# Patient Record
Sex: Male | Born: 1965 | Race: Black or African American | Hispanic: No | Marital: Single | Smoking: Current every day smoker
Health system: Southern US, Community
[De-identification: ages and names within clinical notes are randomized; demographics above are authoritative.]

## PROBLEM LIST (undated history)

## (undated) ENCOUNTER — Emergency Department (HOSPITAL_COMMUNITY): Payer: Self-pay | Source: Home / Self Care

## (undated) DIAGNOSIS — G5602 Carpal tunnel syndrome, left upper limb: Secondary | ICD-10-CM

## (undated) DIAGNOSIS — K219 Gastro-esophageal reflux disease without esophagitis: Secondary | ICD-10-CM

## (undated) DIAGNOSIS — F32A Depression, unspecified: Secondary | ICD-10-CM

## (undated) DIAGNOSIS — G4730 Sleep apnea, unspecified: Secondary | ICD-10-CM

## (undated) DIAGNOSIS — M1990 Unspecified osteoarthritis, unspecified site: Secondary | ICD-10-CM

## (undated) DIAGNOSIS — G5600 Carpal tunnel syndrome, unspecified upper limb: Secondary | ICD-10-CM

## (undated) DIAGNOSIS — F329 Major depressive disorder, single episode, unspecified: Secondary | ICD-10-CM

## (undated) DIAGNOSIS — F419 Anxiety disorder, unspecified: Secondary | ICD-10-CM

## (undated) DIAGNOSIS — I10 Essential (primary) hypertension: Secondary | ICD-10-CM

## (undated) HISTORY — PX: WISDOM TOOTH EXTRACTION: SHX21

## (undated) HISTORY — PX: KNEE SURGERY: SHX244

---

## 1999-06-02 ENCOUNTER — Emergency Department (HOSPITAL_COMMUNITY): Admission: EM | Admit: 1999-06-02 | Discharge: 1999-06-03 | Payer: Self-pay | Admitting: *Deleted

## 1999-10-02 ENCOUNTER — Emergency Department (HOSPITAL_COMMUNITY): Admission: EM | Admit: 1999-10-02 | Discharge: 1999-10-02 | Payer: Self-pay

## 1999-10-04 ENCOUNTER — Observation Stay (HOSPITAL_COMMUNITY): Admission: RE | Admit: 1999-10-04 | Discharge: 1999-10-05 | Payer: Self-pay | Admitting: Orthopedic Surgery

## 2000-05-11 ENCOUNTER — Emergency Department (HOSPITAL_COMMUNITY): Admission: EM | Admit: 2000-05-11 | Discharge: 2000-05-11 | Payer: Self-pay | Admitting: Emergency Medicine

## 2001-06-28 ENCOUNTER — Emergency Department (HOSPITAL_COMMUNITY): Admission: EM | Admit: 2001-06-28 | Discharge: 2001-06-28 | Payer: Self-pay

## 2001-07-09 ENCOUNTER — Emergency Department (HOSPITAL_COMMUNITY): Admission: EM | Admit: 2001-07-09 | Discharge: 2001-07-09 | Payer: Self-pay | Admitting: Emergency Medicine

## 2005-05-14 ENCOUNTER — Emergency Department (HOSPITAL_COMMUNITY): Admission: EM | Admit: 2005-05-14 | Discharge: 2005-05-14 | Payer: Self-pay | Admitting: Emergency Medicine

## 2005-11-27 ENCOUNTER — Emergency Department (HOSPITAL_COMMUNITY): Admission: EM | Admit: 2005-11-27 | Discharge: 2005-11-27 | Payer: Self-pay | Admitting: Emergency Medicine

## 2006-09-18 ENCOUNTER — Emergency Department (HOSPITAL_COMMUNITY): Admission: EM | Admit: 2006-09-18 | Discharge: 2006-09-18 | Payer: Self-pay | Admitting: Emergency Medicine

## 2006-10-06 ENCOUNTER — Ambulatory Visit: Payer: Self-pay | Admitting: Internal Medicine

## 2006-10-06 ENCOUNTER — Inpatient Hospital Stay (HOSPITAL_COMMUNITY): Admission: EM | Admit: 2006-10-06 | Discharge: 2006-10-07 | Payer: Self-pay | Admitting: Emergency Medicine

## 2007-06-01 ENCOUNTER — Emergency Department (HOSPITAL_COMMUNITY): Admission: EM | Admit: 2007-06-01 | Discharge: 2007-06-01 | Payer: Self-pay | Admitting: Emergency Medicine

## 2007-10-29 ENCOUNTER — Emergency Department (HOSPITAL_COMMUNITY): Admission: EM | Admit: 2007-10-29 | Discharge: 2007-10-29 | Payer: Self-pay | Admitting: Emergency Medicine

## 2008-11-04 ENCOUNTER — Encounter: Admission: RE | Admit: 2008-11-04 | Discharge: 2008-11-04 | Payer: Self-pay | Admitting: Internal Medicine

## 2010-10-28 NOTE — Discharge Summary (Signed)
NAMEBROWN, DUNLAP                  ACCOUNT NO.:  [id number]   MEDICAL RECORD NO.:  [id number]          PATIENT TYPE:  INP   LOCATION:  3738                         FACILITY:  [facility]   PHYSICIAN:  Dellia Beckwith, M.D. DATE OF BIRTH:  1965-11-20   DATE OF ADMISSION:  10/06/2006  DATE OF DISCHARGE:  10/07/2006                               DISCHARGE SUMMARY   DISCHARGE DIAGNOSES:  1. Chest pain atypical, of undetermined origin.  2. Polysubstance abuse including tobacco, cocaine, marijuana, and      alcohol  3. History of panic attacks.  4. Right breast abscess status post I&D   DISCHARGE MEDICATIONS:  1. Doxycycline 100 mg p.o. b.i.d.  2. Vicodin 5/500  1 p.o. q.6 h p.r.n., #50 given, without refills.  3. Hot water compresses to right breast 4 times a day.   DISCHARGE DISPOSITION:  The patient was discharged to home in stable  state.  He will be followed in the outpatient clinic by Dr. Dellia Beckwith within 1-2 months.  The clinic was notified and the patient will  be called with his appointment shortly.  He was also notified to come  earlier to an appointment if he notices increased pain, redness or  swelling of his right breast or if he develops any fever.  At the time  of this appointment, he will need to be set up for an outpatient cardiac  stress test most likely a Cardiolite if he has gotten his health  insurance through his work then.   PROCEDURES:  October 07, 2006, bedside incision and drainage of right  breast abscess by Dr. Dellia Beckwith.   ADMITTING HISTORY AND PHYSICAL:  [name] is a 45 year old African  American man with a past medical history as listed above who came in  with multiple complaints on April 27.  1. First complaint was of chest pain that has been intermittent for      the past 2-3 months coming approximately  twice a week, located in      the anterior left chest, described as either sharp, tight, or      burning in quality, usually lasting  approximately 1 minute,      associated with being angry or upset or with hard work but also      sometimes  occurring at rest or during sleep and has occasionally      been associated with shortness of breath, diaphoresis and nausea      and sometimes is relieved by rest.  As for his other cardiac risk      factors he is a smoker of about half pack a day for 20 years.  He      uses cocaine, has never been checked for hypertension and      hyperlipidemia is not diabetic and has no family history of      atherosclerotic disease.  2. His second complaint was that of numbness in both of his arms,      which on further questioning seemed to be more localized on the tip  of his first three fingers on both hands, mostly present at night      time but also randomly during the day.  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  3. For almost a week now he has had significant pain and swelling      around his right nipple area associated with some erythema and also      one small pustule on his right lower abdominal surface.  He denies      any fevers, chills and has had a prior episode of this on his right      axillary area.   REVIEW OF SYSTEMS:  Otherwise negative.   PHYSICAL EXAM:  He had temperature of 98.1, heart rate 83, respiratory  rate 20, blood pressure 145/81, saturation of oxygen 98% on room air.  Weight 85.4 kg.  He was in no acute distress but seemed a little anxious  or nervous,  had no jugular vein distension.  HEENT:  Pupils equal, round, reactive to light and accommodation.  HEART:  Regular rate and rhythm no murmurs, rubs or gallops.  LUNGS:  Clear to auscultation bilaterally.  ABDOMEN:  Completely normal.  EXTREMITIES:  He had no lower extremity  edema and no pain or asymmetry.  He had good distal pulses.  SKIN EXAM:  He had approximately 4-5 cm x 3-4 cm indurated abscess on  his right breast going under his nipple with a small fluctuant area  centrally.  He also had an approximately 0.5 x 0.5 cm pustule or boil on  his right lower abdomen with no signs of acute cellulitis.   LABS:  WBC 9.0 with an absolute neutrophil count of 4.6, hemoglobin  15.1, hematocrit 45.0, platelets 241.  Sodium 133, potassium 4.2,  chloride 108, CO2 24, glucose 83, BUN 13, creatinine 1.  Liver profile  was completely normal and alcohol level was less than five.  Cardiac  markers were negative in the emergency room.  Urinalysis was completely  negative.  Urine drug screen positive for cocaine.  Chest x-ray showed no acute disease.   HOSPITAL COURSE:  The patient was admitted to telemetry to rule out  acute coronary syndrome, given his chest pain and use of cocaine.  His  cardiac enzymes stayed negative throughout hospitalization he had no  recurrence of his pain.  He had no abnormalities on telemetry and was  felt safe for discharge without further treatment.  Given his prolonged  use of cocaine he is at risk for premature coronary artery disease, so  at follow up in the outpatient clinic if he has gotten his health  insurance through his work he should be set up with cardiology for an  outpatient stress test possibly a Myoview.  A fasting lipid profile was  also done while in the hospital which showed a cholesterol of 176,  triglycerides 85, HDL 76 and LDL 83 so he did not need treatment for  hyperlipidemia.  1. Right breast abscess:  Incision and drainage was performed in the      hospital and cultures are pending.  He will be discharged with  doxycycline 100 mg p.o. b.i.d. for 10 days and instructed to use      hot water compresses to his left breast at least four times the day     and to follow up in the clinic if he has any fever, any  worsening      of his symptoms.  We will follow up in the cultures and culture and      him if doxycycline as inappropriate.  2. Polysubstance abuse.  The patient was counseled briefly and will      need more counseling as an outpatient for this problem.   DISCHARGE LABS AND VITALS:  Sodium 137, potassium 4.4, chloride 107, CO2  23, glucose 85, BUN 8, creatinine 0.9, calcium 9.3.      Dellia Beckwith, M.D.  Electronically Signed     VD/MEDQ  D:  10/07/2006  T:  10/07/2006  Job:  [id number]

## 2011-04-01 ENCOUNTER — Emergency Department (HOSPITAL_COMMUNITY): Payer: Self-pay

## 2011-04-01 ENCOUNTER — Emergency Department (HOSPITAL_COMMUNITY)
Admission: EM | Admit: 2011-04-01 | Discharge: 2011-04-01 | Disposition: A | Discharge status: Home / Self Care | Payer: Self-pay | Attending: Emergency Medicine | Admitting: Emergency Medicine

## 2011-04-01 DIAGNOSIS — R209 Unspecified disturbances of skin sensation: Secondary | ICD-10-CM | POA: Insufficient documentation

## 2011-04-01 DIAGNOSIS — Z7982 Long term (current) use of aspirin: Secondary | ICD-10-CM | POA: Insufficient documentation

## 2011-04-01 DIAGNOSIS — M5412 Radiculopathy, cervical region: Secondary | ICD-10-CM | POA: Insufficient documentation

## 2011-04-25 ENCOUNTER — Emergency Department (HOSPITAL_COMMUNITY)
Admission: EM | Admit: 2011-04-25 | Discharge: 2011-04-26 | Disposition: A | Discharge status: Home / Self Care | Payer: Self-pay | Attending: Emergency Medicine | Admitting: Emergency Medicine

## 2011-04-25 ENCOUNTER — Encounter: Payer: Self-pay | Admitting: Emergency Medicine

## 2011-04-25 DIAGNOSIS — IMO0002 Reserved for concepts with insufficient information to code with codable children: Secondary | ICD-10-CM | POA: Insufficient documentation

## 2011-04-25 DIAGNOSIS — M5410 Radiculopathy, site unspecified: Secondary | ICD-10-CM

## 2011-04-25 DIAGNOSIS — R209 Unspecified disturbances of skin sensation: Secondary | ICD-10-CM | POA: Insufficient documentation

## 2011-04-25 MED ORDER — DIAZEPAM 5 MG PO TABS
5.0000 mg | ORAL_TABLET | Freq: Once | ORAL | Status: AC
  Administered 2011-04-25: 5 mg via ORAL
  Filled 2011-04-25: qty 1

## 2011-04-25 MED ORDER — NAPROXEN 500 MG PO TABS: 500.0000 mg | ORAL_TABLET | Freq: Two times a day (BID) | ORAL | Status: DC

## 2011-04-25 MED ORDER — DIAZEPAM 5 MG PO TABS: 5.0000 mg | ORAL_TABLET | Freq: Three times a day (TID) | ORAL | Status: AC | PRN

## 2011-04-25 MED ORDER — NAPROXEN 500 MG PO TABS
500.0000 mg | ORAL_TABLET | Freq: Once | ORAL | Status: AC
  Administered 2011-04-26: 500 mg via ORAL
  Filled 2011-04-25: qty 1

## 2011-04-25 NOTE — ED Notes (Signed)
Pt  St's he is still having pain in his neck with tingling in bil shoulders, arms and fingers.  Pt was here for same on 10/20  Was given Rx's  Pt st's pain returned when pain meds ran out.  Pt has his discharge instructions which tell him to follow up with ortho.  Pt st's he did not know he was suppose to do that

## 2011-04-25 NOTE — ED Notes (Signed)
Happy meal given to patient

## 2011-04-26 NOTE — ED Provider Notes (Signed)
History     CSN: [id number] Arrival date & time: 04/25/2011  7:35 PM   First MD Initiated Contact with Patient 04/25/11 2307      No chief complaint on file.   (Consider location/radiation/quality/duration/timing/severity/associated sxs/prior treatment) HPI 45 year old son a presents to emergency department with complaint of tingling in both hands ongoing for about 2-3 months. Patient reports initially tingling was just to the left hand, but has been worsening over the last several weeks and is now in both hands. He denies any trauma to his head neck. Patient works with his arms over his head most of the time. Patient was seen in the emergency department and placed on a prednisone Dosepak along with Vicodin and told to followup with orthopedics. Patient has not followed up. He reports no improvement with prednisone or the Vicodin. Tingling in his hands is worse at night and wakes her from sleep at times. He denies any weakness. No prior workup for disc disease or radiculopathy History reviewed. No pertinent past medical history.  History reviewed. No pertinent past surgical history.  History reviewed. No pertinent family history.  History  Substance Use Topics  . Smoking status: Not on file  . Smokeless tobacco: Not on file  . Alcohol Use: Yes      Review of Systems  Constitutional: Negative.   HENT: Negative.   Eyes: Negative.   Respiratory: Negative.   Cardiovascular: Negative.   Gastrointestinal: Negative.   Genitourinary: Negative.   Musculoskeletal: Negative.   Skin: Negative.   Neurological: Positive for numbness.  Hematological: Negative.   Psychiatric/Behavioral: Negative.   All other systems reviewed and are negative.    Allergies  Review of patient's allergies indicates no known allergies.  Home Medications   Current Outpatient Rx  Name Route Sig Dispense Refill  . DIAZEPAM 5 MG PO TABS Oral Take 1 tablet (5 mg total) by mouth every 8 (eight) hours as  needed (muscle spasm). 15 tablet 0  . NAPROXEN 500 MG PO TABS Oral Take 1 tablet (500 mg total) by mouth 2 (two) times daily. 30 tablet 0    BP 145/88  Pulse 85  Temp(Src) 97 F (36.1 C) (Oral)  Resp 22  Ht 5\' 11"  (1.803 m)  Wt 220 lb (99.791 kg)  BMI 30.68 kg/m2  SpO2 98%  Physical Exam  Nursing note and vitals reviewed. Constitutional: He is oriented to person, place, and time. He appears well-developed and well-nourished.  HENT:  Head: Normocephalic and atraumatic.  Right Ear: External ear normal.  Left Ear: External ear normal.  Nose: Nose normal.  Mouth/Throat: Oropharynx is clear and moist.  Eyes: Conjunctivae and EOM are normal. Pupils are equal, round, and reactive to light.  Neck: Normal range of motion. Neck supple. No JVD present. No tracheal deviation present. No thyromegaly present.  Cardiovascular: Normal rate, regular rhythm, normal heart sounds and intact distal pulses.  Exam reveals no gallop and no friction rub.   No murmur heard. Pulmonary/Chest: Effort normal and breath sounds normal. No stridor. No respiratory distress. He has no wheezes. He has no rales. He exhibits no tenderness.  Abdominal: Soft. Bowel sounds are normal. He exhibits no distension and no mass. There is no tenderness. There is no rebound and no guarding.  Musculoskeletal: Normal range of motion. He exhibits no edema and no tenderness.  Lymphadenopathy:    He has no cervical adenopathy.  Neurological: He is oriented to person, place, and time. He has normal reflexes. No cranial nerve deficit. He  exhibits normal muscle tone. Coordination normal.       Patient with subjective tingling and decreased sensation in hands. Normal neuro exam.  Skin: Skin is dry. No rash noted. No erythema. No pallor.  Psychiatric: He has a normal mood and affect. His behavior is normal. Judgment and thought content normal.    ED Course  Procedures (including critical care time)  Labs Reviewed - No data to  display No results found.   1. Radiculopathy       MDM  45 year old gentleman with symptoms of radiculopathy in upper tremor days. Symptoms may be due to rotator cuff versus nerve impingement from disc disease. Patient strongly encouraged to follow up with orthopedics. Will place on NSAIDs and muscle relaxants        Olivia Mackie, MD 04/26/11 [phone]

## 2011-11-10 ENCOUNTER — Emergency Department (HOSPITAL_COMMUNITY)
Admission: EM | Admit: 2011-11-10 | Discharge: 2011-11-10 | Disposition: A | Discharge status: Home / Self Care | Payer: Self-pay | Attending: Emergency Medicine | Admitting: Emergency Medicine

## 2011-11-10 ENCOUNTER — Encounter (HOSPITAL_COMMUNITY): Payer: Self-pay | Admitting: Emergency Medicine

## 2011-11-10 DIAGNOSIS — F17200 Nicotine dependence, unspecified, uncomplicated: Secondary | ICD-10-CM | POA: Insufficient documentation

## 2011-11-10 DIAGNOSIS — M25551 Pain in right hip: Secondary | ICD-10-CM

## 2011-11-10 DIAGNOSIS — M25569 Pain in unspecified knee: Secondary | ICD-10-CM | POA: Insufficient documentation

## 2011-11-10 DIAGNOSIS — M792 Neuralgia and neuritis, unspecified: Secondary | ICD-10-CM

## 2011-11-10 DIAGNOSIS — M436 Torticollis: Secondary | ICD-10-CM | POA: Insufficient documentation

## 2011-11-10 MED ORDER — NAPROXEN 500 MG PO TABS: 500.0000 mg | ORAL_TABLET | Freq: Two times a day (BID) | ORAL | Status: DC

## 2011-11-10 MED ORDER — IBUPROFEN 800 MG PO TABS
800.0000 mg | ORAL_TABLET | Freq: Once | ORAL | Status: AC
  Administered 2011-11-10: 800 mg via ORAL
  Filled 2011-11-10: qty 1

## 2011-11-10 MED ORDER — CYCLOBENZAPRINE HCL 10 MG PO TABS: 10.0000 mg | ORAL_TABLET | Freq: Two times a day (BID) | ORAL | Status: AC | PRN

## 2011-11-10 NOTE — ED Provider Notes (Signed)
Medical screening examination/treatment/procedure(s) were performed by non-physician practitioner and as supervising physician I was immediately available for consultation/collaboration.    Nelia Shi, MD 11/10/11 [phone]

## 2011-11-10 NOTE — ED Notes (Signed)
Pt reports pinch nerve and muscular tightness along the left side of his neck. States his symptoms have worsened over the last two weeks and c/o numbness and tingling down left arm. Pt also reports right hip pain for the last 2-3 months.

## 2011-11-10 NOTE — ED Provider Notes (Signed)
History     CSN: [id number]  Arrival date & time 11/10/11  1016   First MD Initiated Contact with Patient 11/10/11 1032      Chief Complaint  Patient presents with  . Torticollis  . Hip Pain    (Consider location/radiation/quality/duration/timing/severity/associated sxs/prior treatment) HPI  And 46 year old male presents complaining of neck pain and hip pain. Patient states for the past 2 weeks he has had a gradual onset of pain to the left side of his neck. Describe pain is a sharp and throbbing sensation worsening with movement. Pain especially noticeable when the turns his head,or when he lift his arms.  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 He has also notice tingling sensation radiates down to his left arm and right arm (L>R).  Admits to occasionally drop objects due to arm weakness.  Denies fever, rash, recent trauma.  Has had similar sxs several months ago and was seen in ER for same.  Sts he was prescribed some medications which temporarily helps.  Has not f/u with ortho due to work schedule.  Pt also complain of R hip pain.  Sts onset is acute, lasting seconds, and usually happens when he turns his body or with positional change.  Pain is sharp, and nonradiating.  Denies urinary complaint, rash, or knee pain.  Denies trauma.    History reviewed. No pertinent past medical history.  Past Surgical History  Procedure Date  . Knee surgery     History reviewed. No pertinent family history.  History  Substance Use Topics  . Smoking status: Current Everyday Smoker  . Smokeless tobacco: Not on file  . Alcohol Use: Yes      Review of Systems  All other systems reviewed and are negative.    Allergies  Review of patient's allergies indicates no known allergies.  Home Medications   Current Outpatient Rx  Name Route Sig Dispense Refill  . NAPROXEN 500 MG PO TABS Oral Take 1 tablet (500 mg total) by mouth 2 (two) times daily. 30  tablet 0    BP 128/86  Pulse 89  Temp(Src) 98.1 F (36.7 C) (Oral)  Resp 20  SpO2 95%  Physical Exam  Nursing note and vitals reviewed. Constitutional: He is oriented to person, place, and time. He appears well-developed and well-nourished. No distress.  HENT:  Head: Normocephalic and atraumatic.  Eyes: Conjunctivae are normal.  Neck: Normal range of motion. Neck supple.  Pulmonary/Chest: No respiratory distress. He exhibits no tenderness.  Musculoskeletal:       Generalized posterior neck and bilateral upper shoulder pain with neck rotation, pain with bilateral shoulder abduction and adduction.  Tenderness with shoulder extension above 90 degree. Neck pain with axial loading and rotation.  No midline spine tenderness.  No point tenderness to neck or shoulder.  No deformity noted  4/5 arm strength bilaterally and subjective decreased in sensation to L middle finger with light touch.  Normal neuro exam.   R hip: FROM, normal sensation, nontender on exam, no rash, 5/5 strength.  No deformity noted  Lymphadenopathy:    He has no cervical adenopathy.  Neurological: He is alert and oriented to person, place, and time.  Skin: Skin is warm. No rash noted.    ED Course  Procedures (including critical care time)  Labs Reviewed - No data to display No results found.   No diagnosis found.    MDM  Neck pain with radicular sxs.  No recent trauma.  Previous cspine xray several months ago shows mild degeneration to joint.  No obvious neuro deficits. No signs of infection.  Plan to treat with naproxen and flexeril and referral to ortho for further management.  Pt voice understanding and agrees with plan.         Fayrene Helper, PA-C 11/10/11 1103  Fayrene Helper, PA-C 11/10/11 1106

## 2011-11-10 NOTE — Discharge Instructions (Signed)
Radicular Pain Radicular pain in either the arm or leg is usually from a bulging or herniated disk in the spine. A piece of the herniated disk may press against the nerves as the nerves exit the spine. This causes pain which is felt at the tips of the nerves down the arm or leg. Other causes of radicular pain may include:  Fractures.   Heart disease.   Cancer.   An abnormal and usually degenerative state of the nervous system or nerves (neuropathy).  Diagnosis may require CT or MRI scanning to determine the primary cause.  Nerves that start at the neck (nerve roots) may cause radicular pain in the outer shoulder and arm. It can spread down to the thumb and fingers. The symptoms vary depending on which nerve root has been affected. In most cases radicular pain improves with conservative treatment. Neck problems may require physical therapy, a neck collar, or cervical traction. Treatment may take many weeks, and surgery may be considered if the symptoms do not improve.  Conservative treatment is also recommended for sciatica. Sciatica causes pain to radiate from the lower back or buttock area down the leg into the foot. Often there is a history of back problems. Most patients with sciatica are better after 2 to 4 weeks of rest and other supportive care. Short term bed rest can reduce the disk pressure considerably. Sitting, however, is not a good position since this increases the pressure on the disk. You should avoid bending, lifting, and all other activities which make the problem worse. Traction can be used in severe cases. Surgery is usually reserved for patients who do not improve within the first months of treatment. Only take over-the-counter or prescription medicines for pain, discomfort, or fever as directed by your caregiver. Narcotics and muscle relaxants may help by relieving more severe pain and spasm and by providing mild sedation. Cold or massage can give significant relief. Spinal  manipulation is not recommended. It can increase the degree of disc protrusion. Epidural steroid injections are often effective treatment for radicular pain. These injections deliver medicine to the spinal nerve in the space between the protective covering of the spinal cord and back bones (vertebrae). Your caregiver can give you more information about steroid injections. These injections are most effective when given within two weeks of the onset of pain.  You should see your caregiver for follow up care as recommended. A program for neck and back injury rehabilitation with stretching and strengthening exercises is an important part of management.  SEEK IMMEDIATE MEDICAL CARE IF:  You develop increased pain, weakness, or numbness in your arm or leg.   You develop difficulty with bladder or bowel control.   You develop abdominal pain.  Document Released: 07/06/2004 Document Revised: 05/18/2011 Document Reviewed: 09/21/2008 [employer] Patient Information 2012 [employer]. 

## 2012-03-09 ENCOUNTER — Encounter (HOSPITAL_COMMUNITY): Payer: Self-pay | Admitting: Emergency Medicine

## 2012-03-09 ENCOUNTER — Emergency Department (HOSPITAL_COMMUNITY)
Admission: EM | Admit: 2012-03-09 | Discharge: 2012-03-09 | Disposition: A | Discharge status: Home / Self Care | Payer: Self-pay | Attending: Emergency Medicine | Admitting: Emergency Medicine

## 2012-03-09 ENCOUNTER — Emergency Department (HOSPITAL_COMMUNITY): Payer: Self-pay

## 2012-03-09 DIAGNOSIS — IMO0001 Reserved for inherently not codable concepts without codable children: Secondary | ICD-10-CM | POA: Insufficient documentation

## 2012-03-09 DIAGNOSIS — Z7982 Long term (current) use of aspirin: Secondary | ICD-10-CM | POA: Insufficient documentation

## 2012-03-09 DIAGNOSIS — F17200 Nicotine dependence, unspecified, uncomplicated: Secondary | ICD-10-CM | POA: Insufficient documentation

## 2012-03-09 DIAGNOSIS — M7918 Myalgia, other site: Secondary | ICD-10-CM

## 2012-03-09 MED ORDER — IBUPROFEN 400 MG PO TABS
800.0000 mg | ORAL_TABLET | Freq: Once | ORAL | Status: AC
  Administered 2012-03-09: 800 mg via ORAL
  Filled 2012-03-09: qty 2

## 2012-03-09 MED ORDER — OXYCODONE-ACETAMINOPHEN 5-325 MG PO TABS
1.0000 | ORAL_TABLET | Freq: Once | ORAL | Status: AC
  Administered 2012-03-09: 1 via ORAL
  Filled 2012-03-09: qty 1

## 2012-03-09 MED ORDER — CYCLOBENZAPRINE HCL 10 MG PO TABS: 10.0000 mg | ORAL_TABLET | Freq: Three times a day (TID) | ORAL | Status: DC | PRN

## 2012-03-09 MED ORDER — TRAMADOL-ACETAMINOPHEN 37.5-325 MG PO TABS: ORAL_TABLET | ORAL | Status: DC

## 2012-03-09 MED ORDER — CYCLOBENZAPRINE HCL 10 MG PO TABS
10.0000 mg | ORAL_TABLET | Freq: Once | ORAL | Status: AC
  Administered 2012-03-09: 10 mg via ORAL
  Filled 2012-03-09: qty 1

## 2012-03-09 MED ORDER — IBUPROFEN 600 MG PO TABS: 600.0000 mg | ORAL_TABLET | Freq: Four times a day (QID) | ORAL | Status: DC | PRN

## 2012-03-09 NOTE — ED Provider Notes (Addendum)
History  This chart was scribed for Ward Givens, MD by Bennett Scrape. This patient was seen in room TR05C/TR05C and the patient's care was started at 3:00PM.  CSN: [id number]  Arrival date & time 03/09/12  1221   None     Chief Complaint  Patient presents with  . Shoulder Pain    left shoulder     The history is provided by the patient. No language interpreter was used.    Spencer Fischer is a 46 y.o. male who presents to the Emergency Department complaining of approximately 24 hours of sudden onset, constant left shoulder pain described as sharp that radiates to his left neck with associated left hand tingling that started after he suddenly stopping a large 200 lb piece of furniture from sliding down the steps while moving. He states it jammed his collar bone.  He reports that he doesn't remember hearing a pop or crack but states that the pain is worse with movement of the left shoulder. He reports that he has occasional tingling in both  Hands but he states that the tingling he is experienced now is more severe. He has been seen in this ED for the tingling and was told to follow up with an orthopedist but has not done so yet. He has had this "for a long time". He has an ED visit in October 2012 for same. He denies having head trauma, LOC, back pain or weakness as associated symptoms. He does not have a h/o chronic medical conditions. He is a current everyday smoker and occasional alcohol user.  No PCP.  History reviewed. No pertinent past medical history.  Past Surgical History  Procedure Date  . Knee surgery     No family history on file.  History  Substance Use Topics  . Smoking status: Current Every Day Smoker  . Smokeless tobacco: Not on file  . Alcohol Use: Yes  works as a Cytogeneticist     Review of Systems  Musculoskeletal: Negative for back pain.       Positive for left shoulder pain  Skin: Negative for wound.  Neurological: Positive for numbness (chronic).  Negative for weakness.    Allergies  Review of patient's allergies indicates no known allergies.  Home Medications   Current Outpatient Rx  Name Route Sig Dispense Refill  . ASPIRIN EC 81 MG PO TBEC Oral Take 81 mg by mouth daily.    . ADULT MULTIVITAMIN W/MINERALS CH Oral Take 1 tablet by mouth daily.    . CYCLOBENZAPRINE HCL 10 MG PO TABS Oral Take 1 tablet (10 mg total) by mouth 3 (three) times daily as needed for muscle spasms. 30 tablet 0  . TRAMADOL-ACETAMINOPHEN 37.5-325 MG PO TABS  2 tabs po QID prn pain 16 tablet 0    Triage Vitals: BP 129/92  Pulse 76  Temp 96.5 F (35.8 C) (Oral)  Resp 18  SpO2 96%  Vital signs normal   Physical Exam  Nursing note and vitals reviewed. Constitutional: He is oriented to person, place, and time. He appears well-developed and well-nourished. No distress.  HENT:  Head: Normocephalic and atraumatic.  Eyes: Conjunctivae normal and EOM are normal. Pupils are equal, round, and reactive to light.  Neck: Normal range of motion. Neck supple. No tracheal deviation present.  Pulmonary/Chest: Effort normal. No respiratory distress.  Musculoskeletal: Normal range of motion.       No pain in the cervical or thoracic spine, equal grips, good distal pulses, tender  along the proximal left trapezius and along the coarse of left clavicle, pain with abduction of left arm in the clavicle. No swelling or deformity. Grip normal.   Neurological: He is alert and oriented to person, place, and time.  Skin: Skin is warm and dry.  Psychiatric: He has a normal mood and affect. His behavior is normal.    ED Course  Procedures (including critical care time)  DIAGNOSTIC STUDIES: Oxygen Saturation is 96% on room air, adequate by my interpretation.    COORDINATION OF CARE: 3:35PM-Informed pt of negative shoulder x-ray. Discussed treatment plan which includes a neck x-ray with pt at bedside and pt agreed to plan.  5:13PM-Pt rechecked and is up walking around  requesting something to eat. Informed pt of negatvie radiology reports and pt acknowledged reports. Discussed discharge plan with pt at bedside and pt agreed to plan.   Medications  oxyCODONE-acetaminophen (PERCOCET/ROXICET) 5-325 MG per tablet 1 tablet (1 tablet Oral Given 03/09/12 1416)  cyclobenzaprine (FLEXERIL) tablet 10 mg (10 mg Oral Given 03/09/12 1636)  ibuprofen (ADVIL,MOTRIN) tablet 800 mg (800 mg Oral Given 03/09/12 1635)    Dg Cervical Spine Complete  03/09/2012  *RADIOLOGY REPORT*  Clinical Data: Neck pain. Tingling in left hand.  Lifting injury.  CERVICAL SPINE - 4+ VIEWS  Comparison:  None.  Findings:  There is no evidence of cervical spine fracture or prevertebral soft tissue swelling.  Alignment is normal.  No other significant bone abnormalities are identified.  IMPRESSION: Negative cervical spine radiographs.   Original Report Authenticated By: Elsie Stain, M.D.    Dg Clavicle Left  03/09/2012  *RADIOLOGY REPORT*  Clinical Data: Lifting injury, pain  LEFT CLAVICLE - 2+ VIEWS  Comparison:  Left shoulder exam performed today.  Findings:  There is no evidence of fracture or other focal bone lesions.  Soft tissues are unremarkable.Mild degenerative change at the distal acromioclavicular joint.  IMPRESSION: No acute findings.   Original Report Authenticated By: Elsie Stain, M.D.    Dg Shoulder Left  03/09/2012  *RADIOLOGY REPORT*  Clinical Data: Left shoulder pain  LEFT SHOULDER - 2+ VIEW  Comparison: None.  Findings: No acute fracture, or malalignment.  The humeral head is located with respect to the glenoid on the scapular Y view.  The acromioclavicular joint is congruent.  There is a mild degenerative change at the acromioclavicular joint with small downward directed osteophytes.  The visualized thorax is unremarkable.  IMPRESSION: Essentially negative radiographs of the left shoulder.  There are mild early degenerative changes of the left acromioclavicular joint.   Original  Report Authenticated By: HEATH      1. Musculoskeletal pain    New Prescriptions   CYCLOBENZAPRINE (FLEXERIL) 10 MG TABLET    Take 1 tablet (10 mg total) by mouth 3 (three) times daily as needed for muscle spasms.   IBUPROFEN (ADVIL,MOTRIN) 600 MG TABLET    Take 1 tablet (600 mg total) by mouth every 6 (six) hours as needed for pain.   TRAMADOL-ACETAMINOPHEN (ULTRACET) 37.5-325 MG PER TABLET    2 tabs po QID prn pain    Plan discharge  Devoria Albe, MD, FACEP    MDM  I personally performed the services described in this documentation, which was scribed in my presence. The recorded information has been reviewed and considered.  Devoria Albe, MD, FACEP    Ward Givens, MD 03/09/12 1731  Ward Givens, MD 03/09/12 1731

## 2012-03-09 NOTE — ED Notes (Signed)
Pt reports moving something and had left shoulder pain immediately last night. Pt c/o pain to left shoulder and arm along with tingling in left hand.

## 2013-01-18 ENCOUNTER — Emergency Department (HOSPITAL_COMMUNITY)
Admission: EM | Admit: 2013-01-18 | Discharge: 2013-01-18 | Disposition: A | Discharge status: Home / Self Care | Payer: No Typology Code available for payment source | Attending: Emergency Medicine | Admitting: Emergency Medicine

## 2013-01-18 ENCOUNTER — Encounter (HOSPITAL_COMMUNITY): Payer: Self-pay

## 2013-01-18 DIAGNOSIS — G5600 Carpal tunnel syndrome, unspecified upper limb: Secondary | ICD-10-CM | POA: Insufficient documentation

## 2013-01-18 DIAGNOSIS — F17200 Nicotine dependence, unspecified, uncomplicated: Secondary | ICD-10-CM | POA: Insufficient documentation

## 2013-01-18 DIAGNOSIS — R209 Unspecified disturbances of skin sensation: Secondary | ICD-10-CM | POA: Insufficient documentation

## 2013-01-18 DIAGNOSIS — G5602 Carpal tunnel syndrome, left upper limb: Secondary | ICD-10-CM

## 2013-01-18 DIAGNOSIS — Z79899 Other long term (current) drug therapy: Secondary | ICD-10-CM | POA: Insufficient documentation

## 2013-01-18 DIAGNOSIS — Z7982 Long term (current) use of aspirin: Secondary | ICD-10-CM | POA: Insufficient documentation

## 2013-01-18 DIAGNOSIS — IMO0002 Reserved for concepts with insufficient information to code with codable children: Secondary | ICD-10-CM | POA: Insufficient documentation

## 2013-01-18 MED ORDER — PREDNISONE 20 MG PO TABS
60.0000 mg | ORAL_TABLET | Freq: Once | ORAL | Status: AC
  Administered 2013-01-18: 60 mg via ORAL
  Filled 2013-01-18: qty 3

## 2013-01-18 MED ORDER — HYDROCODONE-ACETAMINOPHEN 5-325 MG PO TABS: 1.0000 | ORAL_TABLET | Freq: Four times a day (QID) | ORAL | Status: DC | PRN

## 2013-01-18 MED ORDER — HYDROCODONE-ACETAMINOPHEN 5-325 MG PO TABS
1.0000 | ORAL_TABLET | Freq: Once | ORAL | Status: AC
  Administered 2013-01-18: 1 via ORAL
  Filled 2013-01-18: qty 1

## 2013-01-18 MED ORDER — PREDNISONE 20 MG PO TABS: 60.0000 mg | ORAL_TABLET | Freq: Every day | ORAL | Status: AC

## 2013-01-18 NOTE — ED Notes (Signed)
Pt states for the past 3-4 days his arm has had increasing pain described as tingling and feelings of sharp needles. He says that he has carpal tunnel.

## 2013-01-18 NOTE — ED Provider Notes (Signed)
CSN: [id number]     Arrival date & time 01/18/13  0029 History     First MD Initiated Contact with Patient 01/18/13 0040     Chief Complaint  Patient presents with  . Arm Pain   (Consider location/radiation/quality/duration/timing/severity/associated sxs/prior Treatment) HPI Comments: Patient met with known carpal tunnel syndrome, states, that several years ago.  He had steroid injections by a orthopedic, Dr. that worked, but recently.  He has had increase in pain, radiating from his left wrist, to the shoulder.  He, states at night, diffusely, his arm up over his head to relieve some of the discomfort.  He has not taken any medication for this pain.  Denies any trauma.  Patient is a 47 y.o. male presenting with arm pain. The history is provided by the patient.  Arm Pain This is a recurrent problem. The current episode started more than 1 year ago. The problem occurs constantly. The problem has been gradually worsening. Associated symptoms include numbness. Pertinent negatives include no chills, fever, joint swelling, rash or weakness.    History reviewed. No pertinent past medical history. Past Surgical History  Procedure Laterality Date  . Knee surgery     History reviewed. No pertinent family history. History  Substance Use Topics  . Smoking status: Current Every Day Smoker  . Smokeless tobacco: Not on file  . Alcohol Use: Yes    Review of Systems  Constitutional: Negative for fever and chills.  Musculoskeletal: Negative for joint swelling.  Skin: Negative for rash and wound.  Neurological: Positive for numbness. Negative for weakness.  All other systems reviewed and are negative.    Allergies  Review of patient's allergies indicates no known allergies.  Home Medications   Current Outpatient Rx  Name  Route  Sig  Dispense  Refill  . aspirin EC 81 MG tablet   Oral   Take 81 mg by mouth daily.         . cyclobenzaprine (FLEXERIL) 10 MG tablet   Oral   Take 1  tablet (10 mg total) by mouth 3 (three) times daily as needed for muscle spasms.   30 tablet   0   . HYDROcodone-acetaminophen (NORCO/VICODIN) 5-325 MG per tablet   Oral   Take 1 tablet by mouth every 6 (six) hours as needed for pain.   12 tablet   0   . ibuprofen (ADVIL,MOTRIN) 600 MG tablet   Oral   Take 1 tablet (600 mg total) by mouth every 6 (six) hours as needed for pain.   60 tablet   0   . Multiple Vitamin (MULTIVITAMIN WITH MINERALS) TABS   Oral   Take 1 tablet by mouth daily.         . predniSONE (DELTASONE) 20 MG tablet   Oral   Take 3 tablets (60 mg total) by mouth daily.   15 tablet   0   . traMADol-acetaminophen (ULTRACET) 37.5-325 MG per tablet      2 tabs po QID prn pain   16 tablet   0    BP 149/84  Pulse 80  Temp(Src) 97.9 F (36.6 C) (Oral)  Resp 16  SpO2 96% Physical Exam  Nursing note and vitals reviewed. Constitutional: He is oriented to person, place, and time. He appears well-developed and well-nourished.  HENT:  Head: Normocephalic.  Eyes: Pupils are equal, round, and reactive to light.  Neck: Normal range of motion. Neck supple. No spinous process tenderness and no muscular tenderness present.  Pulmonary/Chest:  Effort normal.  Musculoskeletal: Normal range of motion. He exhibits tenderness. He exhibits no edema.       Arms: Neurological: He is alert and oriented to person, place, and time.  Skin: Skin is warm. No rash noted.    ED Course   Procedures (including critical care time)  Labs Reviewed - No data to display No results found. 1. Carpal tunnel syndrome of left wrist     MDM   Patient has been placed in a splint prescriptions for prednisone and Vicodin have been supplied.  Patient is to followup with orthopedic/hand on Monday  Arman Filter, NP 01/18/13 0059  Arman Filter, NP 01/18/13 [phone]

## 2013-01-18 NOTE — ED Provider Notes (Signed)
Medical screening examination/treatment/procedure(s) were performed by non-physician practitioner and as supervising physician I was immediately available for consultation/collaboration.  Jones Skene, M.D.     Jones Skene, MD 01/18/13 1610

## 2013-01-18 NOTE — ED Notes (Signed)
Pt c/o Left arm pain and numbness x3 days. Pt denies chest pain

## 2013-08-26 ENCOUNTER — Emergency Department (HOSPITAL_COMMUNITY): Payer: No Typology Code available for payment source

## 2013-08-26 ENCOUNTER — Encounter (HOSPITAL_COMMUNITY): Payer: Self-pay | Admitting: Emergency Medicine

## 2013-08-26 ENCOUNTER — Emergency Department (HOSPITAL_COMMUNITY)
Admission: EM | Admit: 2013-08-26 | Discharge: 2013-08-26 | Disposition: A | Discharge status: Home / Self Care | Payer: No Typology Code available for payment source | Attending: Emergency Medicine | Admitting: Emergency Medicine

## 2013-08-26 DIAGNOSIS — Z7982 Long term (current) use of aspirin: Secondary | ICD-10-CM | POA: Insufficient documentation

## 2013-08-26 DIAGNOSIS — M1611 Unilateral primary osteoarthritis, right hip: Secondary | ICD-10-CM

## 2013-08-26 DIAGNOSIS — R52 Pain, unspecified: Secondary | ICD-10-CM | POA: Insufficient documentation

## 2013-08-26 DIAGNOSIS — M1610 Unilateral primary osteoarthritis, unspecified hip: Secondary | ICD-10-CM | POA: Insufficient documentation

## 2013-08-26 DIAGNOSIS — M169 Osteoarthritis of hip, unspecified: Secondary | ICD-10-CM | POA: Insufficient documentation

## 2013-08-26 DIAGNOSIS — G8929 Other chronic pain: Secondary | ICD-10-CM | POA: Insufficient documentation

## 2013-08-26 DIAGNOSIS — F17200 Nicotine dependence, unspecified, uncomplicated: Secondary | ICD-10-CM | POA: Insufficient documentation

## 2013-08-26 DIAGNOSIS — Z9889 Other specified postprocedural states: Secondary | ICD-10-CM | POA: Insufficient documentation

## 2013-08-26 MED ORDER — HYDROCODONE-ACETAMINOPHEN 5-325 MG PO TABS: 1.0000 | ORAL_TABLET | Freq: Four times a day (QID) | ORAL | Status: DC | PRN

## 2013-08-26 MED ORDER — OXYCODONE-ACETAMINOPHEN 5-325 MG PO TABS
1.0000 | ORAL_TABLET | Freq: Once | ORAL | Status: AC
  Administered 2013-08-26: 1 via ORAL
  Filled 2013-08-26: qty 1

## 2013-08-26 MED ORDER — IBUPROFEN 600 MG PO TABS: 600.0000 mg | ORAL_TABLET | Freq: Four times a day (QID) | ORAL | Status: DC | PRN

## 2013-08-26 NOTE — ED Provider Notes (Signed)
Medical screening examination/treatment/procedure(s) were performed by non-physician practitioner and as supervising physician I was immediately available for consultation/collaboration.   EKG Interpretation None        Zurich Carreno, MD 08/26/13 2340 

## 2013-08-26 NOTE — ED Notes (Signed)
Pt reports right hip pain for extended amount of time, has gotten worse. Denies any injury to hip.

## 2013-08-26 NOTE — ED Provider Notes (Signed)
CSN: [id number]     Arrival date & time 08/26/13  1640 History  This chart was scribed for non-physician practitioner, Jaynie Crumble, PA-C working with Glynn Octave, MD by Greggory Stallion, ED scribe. This patient was seen in room TR09C/TR09C and the patient's care was started at 6:57 PM.   Chief Complaint  Patient presents with  . Hip Pain   The history is provided by the patient. No language interpreter was used.   HPI Comments: Spencer Fischer is a 48 y.o. male who presents to the Emergency Department complaining of chronic right hip pain that worsened recently. Ambulation and certain movements worsen the pain. Pt has taken ibuprofen with no relief. He had an xray at his orthopedist done about one year ago but is unsure of the results. Denies any new injuries. No fever or chills. No numbness or weakness distally. Pain is lateral and in the front.    History reviewed. No pertinent past medical history. Past Surgical History  Procedure Laterality Date  . Knee surgery     History reviewed. No pertinent family history. History  Substance Use Topics  . Smoking status: Current Every Day Smoker  . Smokeless tobacco: Not on file  . Alcohol Use: Yes    Review of Systems  Musculoskeletal: Positive for arthralgias.  All other systems reviewed and are negative.   Allergies  Review of patient's allergies indicates no known allergies.  Home Medications   Current Outpatient Rx  Name  Route  Sig  Dispense  Refill  . aspirin EC 81 MG tablet   Oral   Take 81 mg by mouth daily.         . cyclobenzaprine (FLEXERIL) 10 MG tablet   Oral   Take 1 tablet (10 mg total) by mouth 3 (three) times daily as needed for muscle spasms.   30 tablet   0   . HYDROcodone-acetaminophen (NORCO/VICODIN) 5-325 MG per tablet   Oral   Take 1 tablet by mouth every 6 (six) hours as needed for pain.   12 tablet   0   . ibuprofen (ADVIL,MOTRIN) 600 MG tablet   Oral   Take 1 tablet (600 mg total) by  mouth every 6 (six) hours as needed for pain.   60 tablet   0   . Multiple Vitamin (MULTIVITAMIN WITH MINERALS) TABS   Oral   Take 1 tablet by mouth daily.         . traMADol-acetaminophen (ULTRACET) 37.5-325 MG per tablet      2 tabs po QID prn pain   16 tablet   0    BP 117/72  Pulse 80  Temp(Src) 97.8 F (36.6 C) (Oral)  Resp 16  Ht 5\' 11"  (1.803 m)  Wt 226 lb 3.2 oz (102.604 kg)  BMI 31.56 kg/m2  SpO2 98%  Physical Exam  Nursing note and vitals reviewed. Constitutional: He is oriented to person, place, and time. He appears well-developed and well-nourished. No distress.  HENT:  Head: Normocephalic and atraumatic.  Eyes: EOM are normal.  Neck: Neck supple. No tracheal deviation present.  Cardiovascular: Normal rate.   Pulmonary/Chest: Effort normal. No respiratory distress.  Musculoskeletal: Normal range of motion.  Tender over the greater trochanter of the right hip. Pain with internal and external rotation of the hip and active flexion. Normal knee. Normal ankle. Dorsal pedal pulses intact.   Neurological: He is alert and oriented to person, place, and time.  Skin: Skin is warm and dry.  Psychiatric:  He has a normal mood and affect. His behavior is normal.    ED Course  Procedures (including critical care time)  DIAGNOSTIC STUDIES: Oxygen Saturation is 98% on RA, normal by my interpretation.    COORDINATION OF CARE: 7:02 PM-Discussed treatment plan which includes an xray and pain medication with pt at bedside and pt agreed to plan.  Labs Review Labs Reviewed - No data to display Imaging Review Dg Hip Complete Right  08/26/2013   CLINICAL DATA:  Right hip pain for 1 year, no known injury  EXAM: RIGHT HIP - COMPLETE 2+ VIEW  COMPARISON:  None  FINDINGS: Pelvic bones are intact. There is moderate to severe narrowing of both hip joints slightly worse on the right. There is moderate to severe bilateral osteophyte formation. There is no fracture or dislocation  of either proximal femur.  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   Electronically Signed   By: Esperanza Heiraymond  Rubner M.D.   On: 08/26/2013 19:36     EKG Interpretation None      MDM   Final diagnoses:  None    Patient's with acute on chronic pain and right hip. No injuries. No fever or chills. No signs of infection. No trauma. 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 This was discussed with patient. Patient will need an orthopedics referral, will start him on ibuprofen, Norco for severe pain, given exercises to start doing. Also discussed weight management. Plan to follow him up with orthopedic specialist for further treatment and possible physical therapy.  Filed Vitals:   08/26/13 1710 08/26/13 1714 08/26/13 2008  BP: 117/72  125/79  Pulse: 80  75  Temp: 97.8 F (36.6 C)    TempSrc: Oral    Resp: 16  18  Height: 5\' 11"  (1.803 m)    Weight: 235 lb (106.595 kg) 226 lb 3.2 oz (102.604 kg)   SpO2: 98%  98%    I personally performed the services described in this documentation, which was scribed in my presence. The recorded information has been reviewed and is accurate.    Lottie Musselatyana A Rochell Mabie, PA-C 08/26/13 2248

## 2013-08-26 NOTE — Discharge Instructions (Signed)
Take pain medications as prescribed. Follow up with orthopedics specialist as referred.    Hip Pain The hips join the upper legs to the lower pelvis. The bones, cartilage, tendons, and muscles of the hip joint perform a lot of work each day holding your body weight and allowing you to move around. Hip pain is a common symptom. It can range from a minor ache to severe pain on 1 or both hips. Pain may be felt on the inside of the hip joint near the groin, or the outside near the buttocks and upper thigh. There may be swelling or stiffness as well. It occurs more often when a person walks or performs activity. There are many reasons hip pain can develop. CAUSES  It is important to work with your caregiver to identify the cause since many conditions can impact the bones, cartilage, muscles, and tendons of the hips. Causes for hip pain include:  Broken (fractured) bones.  Separation of the thighbone from the hip socket (dislocation).  Torn cartilage of the hip joint.  Swelling (inflammation) of a tendon (tendonitis), the sac within the hip joint (bursitis), or a joint.  A weakening in the abdominal wall (hernia), affecting the nerves to the hip.  Arthritis in the hip joint or lining of the hip joint.  Pinched nerves in the back, hip, or upper thigh.  A bulging disc in the spine (herniated disc).  Rarely, bone infection or cancer. DIAGNOSIS  The location of your hip pain will help your caregiver understand what may be causing the pain. A diagnosis is based on your medical history, your symptoms, results from your physical exam, and results from diagnostic tests. Diagnostic tests may include X-ray exams, a computerized magnetic scan (magnetic resonance imaging, MRI), or bone scan. TREATMENT  Treatment will depend on the cause of your hip pain. Treatment may include:  Limiting activities and resting until symptoms improve.  Crutches or other walking supports (a cane or brace).  Ice,  elevation, and compression.  Physical therapy or home exercises.  Shoe inserts or special shoes.  Losing weight.  Medications to reduce pain.  Undergoing surgery. HOME CARE INSTRUCTIONS   Only take over-the-counter or prescription medicines for pain, discomfort, or fever as directed by your caregiver.  Put ice on the injured area:  Put ice in a plastic bag.  Place a towel between your skin and the bag.  Leave the ice on for 15-20 minutes at a time, 03-04 times a day.  Keep your leg raised (elevated) when possible to lessen swelling.  Avoid activities that cause pain.  Follow specific exercises as directed by your caregiver.  Sleep with a pillow between your legs on your most comfortable side.  Record how often you have hip pain, the location of the pain, and what it feels like. This information may be helpful to you and your caregiver.  Ask your caregiver about returning to work or sports and whether you should drive.  Follow up with your caregiver for further exams, therapy, or testing as directed. SEEK MEDICAL CARE IF:   Your pain or swelling continues or worsens after 1 week.  You are feeling unwell or have chills.  You have increasing difficulty with walking.  You have a loss of sensation or other new symptoms.  You have questions or concerns. SEEK IMMEDIATE MEDICAL CARE IF:   You cannot put weight on the affected hip.  You have fallen.  You have a sudden increase in pain and swelling in your  hip.  You have a fever. MAKE SURE YOU:   Understand these instructions.  Will watch your condition.  Will get help right away if you are not doing well or get worse. Document Released: 11/16/2009 Document Revised: 08/21/2011 Document Reviewed: 11/16/2009 [facility] Patient Information 2014 [place], [place].  Osteoarthritis Osteoarthritis is a disease that causes soreness and swelling (inflammation) of a joint. It occurs when the cartilage at the affected joint  wears down. Cartilage acts as a cushion, covering the ends of bones where they meet to form a joint. Osteoarthritis is the most common form of arthritis. It often occurs in older people. The joints affected most often by this condition include those in the:  Ends of the fingers.  Thumbs.  Neck.  Lower back.  Knees.  Hips. CAUSES  Over time, the cartilage that covers the ends of bones begins to wear away. This causes bone to rub on bone, producing pain and stiffness in the affected joints.  RISK FACTORS Certain factors can increase your chances of having osteoarthritis, including:  Older age.  Excessive body weight.  Overuse of joints. SIGNS AND SYMPTOMS   Pain, swelling, and stiffness in the joint.  Over time, the joint may lose its normal shape.  Small deposits of bone (osteophytes) may grow on the edges of the joint.  Bits of bone or cartilage can break off and float inside the joint space. This may cause more pain and damage. DIAGNOSIS  Your health care provider will do a physical exam and ask about your symptoms. Various tests may be ordered, such as:  X-rays of the affected joint.  An MRI scan.  Blood tests to rule out other types of arthritis.  Joint fluid tests. This involves using a needle to draw fluid from the joint and examining the fluid under a microscope. TREATMENT  Goals of treatment are to control pain and improve joint function. Treatment plans may include:  A prescribed exercise program that allows for rest and joint relief.  A weight control plan.  Pain relief techniques, such as:  Properly applied heat and cold.  Electric pulses delivered to nerve endings under the skin (transcutaneous electrical nerve stimulation, TENS).  Massage.  Certain nutritional supplements.  Medicines to control pain, such as:  Acetaminophen.  Nonsteroidal anti-inflammatory drugs (NSAIDs), such as naproxen.  Narcotic or central-acting agents, such as  tramadol.  Corticosteroids. These can be given orally or as an injection.  Surgery to reposition the bones and relieve pain (osteotomy) or to remove loose pieces of bone and cartilage. Joint replacement may be needed in advanced states of osteoarthritis. HOME CARE INSTRUCTIONS   Only take over-the-counter or prescription medicines as directed by your health care provider. Take all medicines exactly as instructed.  Maintain a healthy weight. Follow your health care provider's instructions for weight control. This may include dietary instructions.  Exercise as directed. Your health care provider can recommend specific types of exercise. These may include:  Strengthening exercises These are done to strengthen the muscles that support joints affected by arthritis. They can be performed with weights or with exercise bands to add resistance.  Aerobic activities These are exercises, such as brisk walking or low-impact aerobics, that get your heart pumping.  Range-of-motion activities These keep your joints limber.  Balance and agility exercises These help you maintain daily living skills.  Rest your affected joints as directed by your health care provider.  Follow up with your health care provider as directed. SEEK MEDICAL CARE IF:  Your skin turns red.  You develop a rash in addition to your joint pain.  You have worsening joint pain. SEEK IMMEDIATE MEDICAL CARE IF:  You have a significant loss of weight or appetite.  You have a fever along with joint or muscle aches.  You have night sweats. FOR MORE INFORMATION  National Institute of Arthritis and Musculoskeletal and Skin Diseases: www.niams.http://www.myers.net/nih.gov [employer]: https://walker.com/www.nia.nih.gov American College of Rheumatology: www.rheumatology.org Document Released: 05/29/2005 Document Revised: 03/19/2013 Document Reviewed: 02/03/2013 [facility] Patient Information 2014 [employer], [place]. Hip Exercises RANGE OF MOTION (ROM) AND  STRETCHING EXERCISES  These exercises may help you when beginning to rehabilitate your injury. Doing them too aggressively can worsen your condition. Complete them slowly and gently. Your symptoms may resolve with or without further involvement from your physician, physical therapist or athletic trainer. While completing these exercises, remember:   Restoring tissue flexibility helps normal motion to return to the joints. This allows healthier, less painful movement and activity.  An effective stretch should be held for at least 30 seconds.  A stretch should never be painful. You should only feel a gentle lengthening or release in the stretched tissue. If these stretches worsen your symptoms even when done gently, consult your physician, physical therapist or athletic trainer. STRETCH Hamstrings, Supine   Lie on your back. Loop a belt or towel over the ball of your right / left foot.  Straighten your right / left knee and slowly pull on the belt to raise your leg. Do not allow the right / left knee to bend. Keep your opposite leg flat on the floor.  Raise the leg until you feel a gentle stretch behind your right / left knee or thigh. Hold this position for __________ seconds. Repeat __________ times. Complete this stretch __________ times per day.  STRETCH - Hip Rotators   Lie on your back on a firm surface. Grasp your right / left knee with your right / left hand and your ankle with your opposite hand.  Keeping your hips and shoulders firmly planted, gently pull your right / left knee and rotate your lower leg toward your opposite shoulder until you feel a stretch in your buttocks.  Hold this stretch for __________ seconds. Repeat this stretch __________ times. Complete this stretch __________ times per day. STRETCH - Hamstrings/Adductors, V-Sit   Sit on the floor with your legs extended in a large "V," keeping your knees straight.  With your head and chest upright, bend at your waist  reaching for your right foot to stretch your left adductors.  You should feel a stretch in your left inner thigh. Hold for __________ seconds.  Return to the upright position to relax your leg muscles.  Continuing to keep your chest upright, bend straight forward at your waist to stretch your hamstrings.  You should feel a stretch behind both of your thighs and/or knees. Hold for __________ seconds.  Return to the upright position to relax your leg muscles.  Repeat steps 2 through 4 for opposite leg. Repeat __________ times. Complete this exercise __________ times per day.  STRETCHING - Hip Flexors, Lunge  Half kneel with your right / left knee on the floor and your opposite knee bent and directly over your ankle.  Keep good posture with your head over your shoulders. Tighten your buttocks to point your tailbone downward; this will prevent your back from arching too much.  You should feel a gentle stretch in the front of your thigh and/or hip.  If you do not feel any resistance, slightly slide your opposite foot forward and then slowly lunge forward so your knee once again lines up over your ankle. Be sure your tailbone remains pointed downward.  Hold this stretch for __________ seconds. Repeat __________ times. Complete this stretch __________ times per day. STRENGTHENING EXERCISES These exercises may help you when beginning to rehabilitate your injury. They may resolve your symptoms with or without further involvement from your physician, physical therapist or athletic trainer. While completing these exercises, remember:   Muscles can gain both the endurance and the strength needed for everyday activities through controlled exercises.  Complete these exercises as instructed by your physician, physical therapist or athletic trainer. Progress the resistance and repetitions only as guided.  You may experience muscle soreness or fatigue, but the pain or discomfort you are trying to  eliminate should never worsen during these exercises. If this pain does worsen, stop and make certain you are following the directions exactly. If the pain is still present after adjustments, discontinue the exercise until you can discuss the trouble with your clinician. STRENGTH - Hip Extensors, Bridge   Lie on your back on a firm surface. Bend your knees and place your feet flat on the floor.  Tighten your buttocks muscles and lift your bottom off the floor until your trunk is level with your thighs. You should feel the muscles in your buttocks and back of your thighs working. If you do not feel these muscles, slide your feet 1-2 inches further away from your buttocks.  Hold this position for __________ seconds.  Slowly lower your hips to the starting position and allow your buttock muscles relax completely before beginning the next repetition.  If this exercise is too easy, you may cross your arms over your chest. Repeat __________ times. Complete this exercise __________ times per day.  STRENGTH - Hip Abductors, Straight Leg Raises  Be aware of your form throughout the entire exercise so that you exercise the correct muscles. Sloppy form means that you are not strengthening the correct muscles.  Lie on your side so that your head, shoulders, knee and hip line up. You may bend your lower knee to help maintain your balance. Your right / left leg should be on top.  Roll your hips slightly forward, so that your hips are stacked directly over each other and your right / left knee is facing forward.  Lift your top leg up 4-6 inches, leading with your heel. Be sure that your foot does not drift forward or that your knee does not roll toward the ceiling.  Hold this position for __________ seconds. You should feel the muscles in your outer hip lifting (you may not notice this until your leg begins to tire).  Slowly lower your leg to the starting position. Allow the muscles to fully relax before  beginning the next repetition. Repeat __________ times. Complete this exercise __________ times per day.  STRENGTH - Hip Adductors, Straight Leg Raises   Lie on your side so that your head, shoulders, knee and hip line up. You may place your upper foot in front to help maintain your balance. Your right / left leg should be on the bottom.  Roll your hips slightly forward, so that your hips are stacked directly over each other and your right / left knee is facing forward.  Tense the muscles in your inner thigh and lift your bottom leg 4-6 inches. Hold this position for __________ seconds.  Slowly lower  your leg to the starting position. Allow the muscles to fully relax before beginning the next repetition. Repeat __________ times. Complete this exercise __________ times per day.  STRENGTH - Quadriceps, Straight Leg Raises  Quality counts! Watch for signs that the quadriceps muscle is working to insure you are strengthening the correct muscles and not "cheating" by substituting with healthier muscles.  Lay on your back with your right / left leg extended and your opposite knee bent.  Tense the muscles in the front of your right / left thigh. You should see either your knee cap slide up or increased dimpling just above the knee. Your thigh may even quiver.  Tighten these muscles even more and raise your leg 4 to 6 inches off the floor. Hold for right / left seconds.  Keeping these muscles tense, lower your leg.  Relax the muscles slowly and completely in between each repetition. Repeat __________ times. Complete this exercise __________ times per day.  STRENGTH - Hip Abductors, Standing  Tie one end of a rubber exercise band/tubing to a secure surface (table, pole) and tie a loop at the other end.  Place the loop around your right / left ankle. Keeping your ankle with the band directly opposite of the secured end, step away until there is tension in the tube/band.  Hold onto a chair as  needed for balance.  Keeping your back upright, your shoulders over your hips, and your toes pointing forward, lift your right / left leg out to your side. Be sure to lift your leg with your hip muscles. Do not "throw" your leg or tip your body to lift your leg.  Slowly and with control, return to the starting position. Repeat exercise __________ times. Complete this exercise __________ times per day.  STRENGTH  Quadriceps, Squats  Stand in a door frame so that your feet and knees are in line with the frame.  Use your hands for balance, not support, on the frame.  Slowly lower your weight, bending at the hips and knees. Keep your lower legs upright so that they are parallel with the door frame. Squat only within the range that does not increase your knee pain. Never let your hips drop below your knees.  Slowly return upright, pushing with your legs, not pulling with your hands. Document Released: 06/16/2005 Document Revised: 08/21/2011 Document Reviewed: 09/10/2008 [facility] Patient Information 2014 [place], [place].

## 2013-08-26 NOTE — ED Notes (Signed)
PT ambulated with baseline gait; VSS; A&Ox3; no signs of distress; respirations even and unlabored; skin warm and dry; no questions upon discharge.  

## 2013-08-26 NOTE — ED Notes (Signed)
PA at bedside.

## 2013-11-24 ENCOUNTER — Emergency Department (HOSPITAL_COMMUNITY)
Admission: EM | Admit: 2013-11-24 | Discharge: 2013-11-24 | Disposition: A | Discharge status: Home / Self Care | Payer: No Typology Code available for payment source | Attending: Emergency Medicine | Admitting: Emergency Medicine

## 2013-11-24 ENCOUNTER — Encounter (HOSPITAL_COMMUNITY): Payer: Self-pay | Admitting: Emergency Medicine

## 2013-11-24 DIAGNOSIS — B356 Tinea cruris: Secondary | ICD-10-CM | POA: Insufficient documentation

## 2013-11-24 DIAGNOSIS — M169 Osteoarthritis of hip, unspecified: Secondary | ICD-10-CM | POA: Insufficient documentation

## 2013-11-24 DIAGNOSIS — M1990 Unspecified osteoarthritis, unspecified site: Secondary | ICD-10-CM

## 2013-11-24 DIAGNOSIS — S31109A Unspecified open wound of abdominal wall, unspecified quadrant without penetration into peritoneal cavity, initial encounter: Secondary | ICD-10-CM | POA: Insufficient documentation

## 2013-11-24 DIAGNOSIS — R300 Dysuria: Secondary | ICD-10-CM | POA: Insufficient documentation

## 2013-11-24 DIAGNOSIS — M1610 Unilateral primary osteoarthritis, unspecified hip: Secondary | ICD-10-CM | POA: Insufficient documentation

## 2013-11-24 DIAGNOSIS — W503XXA Accidental bite by another person, initial encounter: Secondary | ICD-10-CM

## 2013-11-24 DIAGNOSIS — F17200 Nicotine dependence, unspecified, uncomplicated: Secondary | ICD-10-CM | POA: Insufficient documentation

## 2013-11-24 LAB — URINALYSIS, ROUTINE W REFLEX MICROSCOPIC
BILIRUBIN URINE: NEGATIVE
GLUCOSE, UA: NEGATIVE mg/dL
Ketones, ur: NEGATIVE mg/dL
Leukocytes, UA: NEGATIVE
Nitrite: NEGATIVE
PH: 5 (ref 5.0–8.0)
Protein, ur: NEGATIVE mg/dL
SPECIFIC GRAVITY, URINE: 1.022 (ref 1.005–1.030)
Urobilinogen, UA: 0.2 mg/dL (ref 0.0–1.0)

## 2013-11-24 LAB — URINE MICROSCOPIC-ADD ON

## 2013-11-24 MED ORDER — HYDROCODONE-ACETAMINOPHEN 5-325 MG PO TABS
1.0000 | ORAL_TABLET | Freq: Once | ORAL | Status: AC
  Administered 2013-11-24: 1 via ORAL
  Filled 2013-11-24: qty 1

## 2013-11-24 MED ORDER — HYDROCODONE-ACETAMINOPHEN 5-325 MG PO TABS: 1.0000 | ORAL_TABLET | Freq: Four times a day (QID) | ORAL | Status: DC | PRN

## 2013-11-24 NOTE — ED Provider Notes (Signed)
  Medical screening examination/treatment/procedure(s) were performed by non-physician practitioner and as supervising physician I was immediately available for consultation/collaboration.   EKG Interpretation None         Gerhard Munchobert Cataldo Cosgriff, MD 11/24/13 [phone]
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
## 2013-11-24 NOTE — Discharge Instructions (Signed)

## 2014-02-02 ENCOUNTER — Emergency Department (HOSPITAL_COMMUNITY)
Admission: EM | Admit: 2014-02-02 | Discharge: 2014-02-02 | Disposition: A | Discharge status: Home / Self Care | Payer: No Typology Code available for payment source | Attending: Family Medicine | Admitting: Family Medicine

## 2014-02-02 ENCOUNTER — Encounter (HOSPITAL_COMMUNITY): Payer: Self-pay | Admitting: Emergency Medicine

## 2014-02-02 DIAGNOSIS — F17200 Nicotine dependence, unspecified, uncomplicated: Secondary | ICD-10-CM | POA: Insufficient documentation

## 2014-02-02 DIAGNOSIS — M1610 Unilateral primary osteoarthritis, unspecified hip: Secondary | ICD-10-CM | POA: Insufficient documentation

## 2014-02-02 DIAGNOSIS — Z791 Long term (current) use of non-steroidal anti-inflammatories (NSAID): Secondary | ICD-10-CM | POA: Insufficient documentation

## 2014-02-02 DIAGNOSIS — M1611 Unilateral primary osteoarthritis, right hip: Secondary | ICD-10-CM

## 2014-02-02 DIAGNOSIS — M169 Osteoarthritis of hip, unspecified: Secondary | ICD-10-CM | POA: Insufficient documentation

## 2014-02-02 DIAGNOSIS — M25559 Pain in unspecified hip: Secondary | ICD-10-CM | POA: Insufficient documentation

## 2014-02-02 MED ORDER — KETOROLAC TROMETHAMINE 30 MG/ML IJ SOLN
30.0000 mg | Freq: Once | INTRAMUSCULAR | Status: AC
  Administered 2014-02-02: 30 mg via INTRAMUSCULAR
  Filled 2014-02-02: qty 1

## 2014-02-02 MED ORDER — KETOROLAC TROMETHAMINE 30 MG/ML IJ SOLN: 30.0000 mg | Freq: Once | INTRAMUSCULAR | Status: DC

## 2014-02-02 MED ORDER — DICLOFENAC POTASSIUM 50 MG PO TABS: 50.0000 mg | ORAL_TABLET | Freq: Three times a day (TID) | ORAL | Status: DC

## 2014-02-02 NOTE — ED Notes (Signed)
Declined W/C at D/C and was escorted to lobby by RN. 

## 2014-02-02 NOTE — ED Provider Notes (Signed)
CSN: [id number]     Arrival date & time 02/02/14  1251 History   First MD Initiated Contact with Patient 02/02/14 1305     Chief Complaint  Patient presents with  . Hip Pain     (Consider location/radiation/quality/duration/timing/severity/associated sxs/prior Treatment) Patient is a 48 y.o. male presenting with hip pain. The history is provided by the patient.  Hip Pain This is a chronic problem. The current episode started more than 1 week ago (hip pain for 1.41yrs now transferring to [facility]). Pertinent negatives include no abdominal pain. Associated symptoms comments: No numbness or leg swelling.Marland Kitchen    History reviewed. No pertinent past medical history. Past Surgical History  Procedure Laterality Date  . Knee surgery     No family history on file. History  Substance Use Topics  . Smoking status: Current Every Day Smoker -- 0.25 packs/day    Types: Cigarettes  . Smokeless tobacco: Not on file  . Alcohol Use: Yes    Review of Systems  Constitutional: Negative.   Gastrointestinal: Negative.  Negative for abdominal pain.  Genitourinary: Negative.   Musculoskeletal: Positive for gait problem. Negative for back pain and myalgias.  Skin: Negative.       Allergies  Review of patient's allergies indicates no known allergies.  Home Medications   Prior to Admission medications   Medication Sig Start Date End Date Taking? Authorizing Provider  diclofenac (CATAFLAM) 50 MG tablet Take 1 tablet (50 mg total) by mouth 3 (three) times daily. For hip pain. 02/02/14   Linna Hoff, MD   BP 137/95  Pulse 78  Temp(Src) 98 F (36.7 C) (Oral)  Resp 18  SpO2 100% Physical Exam  Nursing note and vitals reviewed. Constitutional: He is oriented to person, place, and time. He appears well-developed and well-nourished.  Musculoskeletal: He exhibits tenderness.       Right hip: He exhibits decreased range of motion, tenderness and bony tenderness. He exhibits no deformity.   Neurological: He is alert and oriented to person, place, and time.  Skin: Skin is warm and dry.    ED Course  Procedures (including critical care time) Labs Review Labs Reviewed - No data to display  Imaging Review No results found.   EKG Interpretation None      MDM   Final diagnoses:  Primary osteoarthritis of right hip        Linna Hoff, MD 02/02/14 [phone]

## 2014-02-02 NOTE — Discharge Instructions (Signed)
See your doctor as planned. °

## 2014-02-02 NOTE — ED Notes (Signed)
Pt reports right hip pain x "years." States he hasn't been able to get in to see PCP. Denies any new changes. Pt ambulatory to triage. NAD. AO x4.

## 2014-02-09 NOTE — Discharge Planning (Signed)
[facility] Community Liaison was not able to see pt, [facility] orange card information will be sent to the address listed. °

## 2014-09-16 ENCOUNTER — Emergency Department (HOSPITAL_COMMUNITY)
Admission: EM | Admit: 2014-09-16 | Discharge: 2014-09-16 | Disposition: A | Discharge status: Home / Self Care | Payer: Self-pay | Attending: Emergency Medicine | Admitting: Emergency Medicine

## 2014-09-16 ENCOUNTER — Emergency Department (HOSPITAL_COMMUNITY): Payer: No Typology Code available for payment source

## 2014-09-16 ENCOUNTER — Encounter (HOSPITAL_COMMUNITY): Payer: Self-pay | Admitting: Family Medicine

## 2014-09-16 DIAGNOSIS — Y9289 Other specified places as the place of occurrence of the external cause: Secondary | ICD-10-CM | POA: Insufficient documentation

## 2014-09-16 DIAGNOSIS — Y998 Other external cause status: Secondary | ICD-10-CM | POA: Insufficient documentation

## 2014-09-16 DIAGNOSIS — W19XXXA Unspecified fall, initial encounter: Secondary | ICD-10-CM

## 2014-09-16 DIAGNOSIS — Z791 Long term (current) use of non-steroidal anti-inflammatories (NSAID): Secondary | ICD-10-CM | POA: Insufficient documentation

## 2014-09-16 DIAGNOSIS — G8929 Other chronic pain: Secondary | ICD-10-CM | POA: Insufficient documentation

## 2014-09-16 DIAGNOSIS — Y9389 Activity, other specified: Secondary | ICD-10-CM | POA: Insufficient documentation

## 2014-09-16 DIAGNOSIS — M1611 Unilateral primary osteoarthritis, right hip: Secondary | ICD-10-CM | POA: Insufficient documentation

## 2014-09-16 DIAGNOSIS — S0590XA Unspecified injury of unspecified eye and orbit, initial encounter: Secondary | ICD-10-CM | POA: Insufficient documentation

## 2014-09-16 DIAGNOSIS — W1839XA Other fall on same level, initial encounter: Secondary | ICD-10-CM | POA: Insufficient documentation

## 2014-09-16 DIAGNOSIS — Z720 Tobacco use: Secondary | ICD-10-CM | POA: Insufficient documentation

## 2014-09-16 DIAGNOSIS — S79911A Unspecified injury of right hip, initial encounter: Secondary | ICD-10-CM | POA: Insufficient documentation

## 2014-09-16 DIAGNOSIS — M159 Polyosteoarthritis, unspecified: Secondary | ICD-10-CM

## 2014-09-16 DIAGNOSIS — M150 Primary generalized (osteo)arthritis: Secondary | ICD-10-CM

## 2014-09-16 DIAGNOSIS — S199XXA Unspecified injury of neck, initial encounter: Secondary | ICD-10-CM | POA: Insufficient documentation

## 2014-09-16 MED ORDER — HYDROCODONE-ACETAMINOPHEN 5-325 MG PO TABS
2.0000 | ORAL_TABLET | Freq: Once | ORAL | Status: AC
  Administered 2014-09-16: 2 via ORAL
  Filled 2014-09-16: qty 2

## 2014-09-16 MED ORDER — MELOXICAM 7.5 MG PO TABS: 7.5000 mg | ORAL_TABLET | Freq: Every day | ORAL | Status: DC

## 2014-09-16 MED ORDER — HYDROCODONE-ACETAMINOPHEN 5-325 MG PO TABS: 2.0000 | ORAL_TABLET | ORAL | Status: DC | PRN

## 2014-09-16 NOTE — ED Notes (Signed)
Pt sts right hip pain that started yesterday. sts also some neck spasm. Denies injury

## 2014-09-16 NOTE — ED Notes (Signed)
Patient states he needs to get hydrocodone refilled.

## 2014-09-16 NOTE — Discharge Instructions (Signed)

## 2014-09-16 NOTE — ED Provider Notes (Signed)
CSN: [id number]     Arrival date & time 09/16/14  1247 History  This chart is scribed for non-physician practitioner, Elson AreasLeslie K Sofia, PA-C, working with Doug SouSam Jacubowitz, MD by Abel PrestoKara Demonbreun, ED Scribe.  This patient was seen in room TR11C/TR11C and the patient's care was started 1:22 PM.     Chief Complaint  Patient presents with  . Hip Pain    Patient is a 49 y.o. male presenting with hip pain. The history is provided by the patient. No language interpreter was used.  Hip Pain   HPI Comments: Spencer Fischer is a 49 y.o. male who presents to the Emergency Department complaining of chronic hip pain worsening yesterday. Pt states he was bending down to pick something up yesterday and twisted his hips wrong and fell, landing on his right hip. Pt with h/o of osteoarthritis of right hip. Pt's PCP is Dr. Willey BladeEric Dean. He has a scheduled appointment to see Dr. August Saucerean at the end of April. Pt requesting refill for his hydrocodone. Pt also reports waxing and waning neck pain stating pain radiates to his left eye. Pt denies head injury, numbness, weakness, and LOC.  History reviewed. No pertinent past medical history. Past Surgical History  Procedure Laterality Date  . Knee surgery     History reviewed. No pertinent family history. History  Substance Use Topics  . Smoking status: Current Every Day Smoker -- 0.25 packs/day    Types: Cigarettes  . Smokeless tobacco: Not on file  . Alcohol Use: Yes    Review of Systems  Eyes: Positive for pain.  Musculoskeletal: Positive for arthralgias and neck pain.  Neurological: Negative for syncope, weakness and numbness.      Allergies  Review of patient's allergies indicates no known allergies.  Home Medications   Prior to Admission medications   Medication Sig Start Date End Date Taking? Authorizing Provider  diclofenac (CATAFLAM) 50 MG tablet Take 1 tablet (50 mg total) by mouth 3 (three) times daily. For hip pain. 02/02/14   Linna HoffJames D Kindl, MD   BP  116/91 mmHg  Pulse 81  Temp(Src) 97.4 F (36.3 C) (Oral)  Resp 18  SpO2 97% Physical Exam  Constitutional: He is oriented to person, place, and time. He appears well-developed and well-nourished.  HENT:  Head: Normocephalic.  Eyes: Conjunctivae are normal.  Neck: Normal range of motion. Neck supple.  Pulmonary/Chest: Effort normal.  Musculoskeletal: Normal range of motion.       Right hip: He exhibits tenderness (diffusely ).       Cervical back: He exhibits tenderness (fleshy part of left lateral neck). He exhibits no bony tenderness.  Neurological: He is alert and oriented to person, place, and time.  Skin: Skin is warm and dry.  Psychiatric: He has a normal mood and affect. His behavior is normal.  Nursing note and vitals reviewed.   ED Course  Procedures (including critical care time) DIAGNOSTIC STUDIES: Oxygen Saturation is 97% on room air, normal by my interpretation.    COORDINATION OF CARE: 1:29 PM Discussed treatment plan with patient at beside, the patient agrees with the plan and has no further questions at this time.   Labs Review Labs Reviewed - No data to display  Imaging Review Dg Cervical Spine Complete  09/16/2014   CLINICAL DATA:  Complaining of chronic right hip pain. Left-sided cervical pain.  EXAM: CERVICAL SPINE  4+ VIEWS  COMPARISON:  03/09/2012  FINDINGS: There is no evidence of cervical spine fracture or prevertebral  soft tissue swelling. Alignment is normal. No other significant bone abnormalities are identified.  IMPRESSION: Negative cervical spine radiographs.   Electronically Signed   By: Elige Ko   On: 09/16/2014 14:30   Dg Hip Unilat With Pelvis 2-3 Views Right  09/16/2014   CLINICAL DATA:  Chronic right hip pain.  Status post fall.  EXAM: RIGHT HIP (WITH PELVIS) 2-3 VIEWS  COMPARISON:  None.  FINDINGS: There is no evidence of hip fracture or dislocation. Moderate -severe bilateral osteoarthritis of the hips with joint space narrowing,  subchondral reactive changes and marginal osteophytosis, right greater than left.  IMPRESSION: 1. No acute osseous injury of the right hip. 2. Moderate -severe bilateral osteoarthritis of the hips, right greater than left.   Electronically Signed   By: Elige Ko   On: 09/16/2014 14:31     EKG Interpretation None      MDM  Pt given 2 hydrocodone.   Pt advised he needs to discuss pain management with Dr. August Saucer.   Pt advised he needs to see an Orthopaedist for hip arthritis.     Final diagnoses:  Fall  Primary osteoarthritis involving multiple joints        Elson Areas, PA-C 09/16/14 [address] [place], [place] 09/16/14 1456  Doug Sou, MD 09/16/14 1734

## 2014-09-16 NOTE — ED Notes (Signed)
Patient came to nurses station and advised that he already has the orange card, but wanted to know what other kind of financial he could get with Dr. Alfonso Patteneans office exclusively.  Advised unsure of what Dr. Alfonso Patteneans office has available for help and that he would need to contact them.

## 2015-01-28 ENCOUNTER — Emergency Department (HOSPITAL_COMMUNITY)
Admission: EM | Admit: 2015-01-28 | Discharge: 2015-01-28 | Disposition: A | Discharge status: Home / Self Care | Payer: No Typology Code available for payment source | Attending: Emergency Medicine | Admitting: Emergency Medicine

## 2015-01-28 ENCOUNTER — Encounter (HOSPITAL_COMMUNITY): Payer: Self-pay | Admitting: Emergency Medicine

## 2015-01-28 ENCOUNTER — Emergency Department (HOSPITAL_COMMUNITY): Payer: No Typology Code available for payment source

## 2015-01-28 DIAGNOSIS — X58XXXA Exposure to other specified factors, initial encounter: Secondary | ICD-10-CM | POA: Insufficient documentation

## 2015-01-28 DIAGNOSIS — Z8669 Personal history of other diseases of the nervous system and sense organs: Secondary | ICD-10-CM | POA: Insufficient documentation

## 2015-01-28 DIAGNOSIS — Y998 Other external cause status: Secondary | ICD-10-CM | POA: Insufficient documentation

## 2015-01-28 DIAGNOSIS — M25552 Pain in left hip: Secondary | ICD-10-CM

## 2015-01-28 DIAGNOSIS — Z720 Tobacco use: Secondary | ICD-10-CM | POA: Insufficient documentation

## 2015-01-28 DIAGNOSIS — I10 Essential (primary) hypertension: Secondary | ICD-10-CM | POA: Insufficient documentation

## 2015-01-28 DIAGNOSIS — G8929 Other chronic pain: Secondary | ICD-10-CM | POA: Insufficient documentation

## 2015-01-28 DIAGNOSIS — Y9289 Other specified places as the place of occurrence of the external cause: Secondary | ICD-10-CM | POA: Insufficient documentation

## 2015-01-28 DIAGNOSIS — S79912A Unspecified injury of left hip, initial encounter: Secondary | ICD-10-CM | POA: Insufficient documentation

## 2015-01-28 DIAGNOSIS — Y9389 Activity, other specified: Secondary | ICD-10-CM | POA: Insufficient documentation

## 2015-01-28 HISTORY — DX: Carpal tunnel syndrome, unspecified upper limb: G56.00

## 2015-01-28 HISTORY — DX: Essential (primary) hypertension: I10

## 2015-01-28 MED ORDER — HYDROMORPHONE HCL 1 MG/ML IJ SOLN
2.0000 mg | Freq: Once | INTRAMUSCULAR | Status: AC
  Administered 2015-01-28: 2 mg via INTRAMUSCULAR
  Filled 2015-01-28: qty 2

## 2015-01-28 MED ORDER — KETOROLAC TROMETHAMINE 60 MG/2ML IM SOLN
60.0000 mg | Freq: Once | INTRAMUSCULAR | Status: AC
  Administered 2015-01-28: 60 mg via INTRAMUSCULAR
  Filled 2015-01-28: qty 2

## 2015-01-28 MED ORDER — MELOXICAM 7.5 MG PO TABS: 15.0000 mg | ORAL_TABLET | Freq: Every day | ORAL | Status: DC

## 2015-01-28 NOTE — Discharge Instructions (Signed)
Please follow up with your primary care physician in 1-2 days. If you do not have one please call the [facility] number listed above. Please read all discharge instructions and return precautions.  ° °Chronic Pain Discharge Instructions  °Emergency care providers appreciate that many patients coming to us are in severe pain and we wish to address their pain in the safest, most responsible manner.  It is important to recognize however, that the proper treatment of chronic pain differs from that of the pain of injuries and acute illnesses.  Our goal is to provide quality, safe, personalized care and we thank you for giving us the opportunity to serve you. °The use of narcotics and related agents for chronic pain syndromes may lead to additional physical and psychological problems.  Nearly as many people die from prescription narcotics each year as die from car crashes.  Additionally, this risk is increased if such prescriptions are obtained from a variety of sources.  Therefore, only your primary care physician or a pain management specialist is able to safely treat such syndromes with narcotic medications long-term.   ° °Documentation revealing such prescriptions have been sought from multiple sources may prohibit us from providing a refill or different narcotic medication.  Your name may be checked first through the [facility] Controlled Substances Reporting System.  This database is a record of controlled substance medication prescriptions that the patient has received.  This has been established by [facility] in an effort to eliminate the dangerous, and often life threatening, practice of obtaining multiple prescriptions from different medical providers.  ° °If you have a chronic pain syndrome (i.e. chronic headaches, recurrent back or neck pain, dental pain, abdominal or pelvis pain without a specific diagnosis, or neuropathic pain such as fibromyalgia) or recurrent visits for the  same condition without an acute diagnosis, you may be treated with non-narcotics and other non-addictive medicines.  Allergic reactions or negative side effects that may be reported by a patient to such medications will not typically lead to the use of a narcotic analgesic or other controlled substance as an alternative. °  °Patients managing chronic pain with a personal physician should have provisions in place for breakthrough pain.  If you are in crisis, you should call your physician.  If your physician directs you to the emergency department, please have the doctor call and speak to our attending physician concerning your care. °  °When patients come to the Emergency Department (ED) with acute medical conditions in which the Emergency Department physician feels appropriate to prescribe narcotic or sedating pain medication, the physician will prescribe these in very limited quantities.  The amount of these medications will last only until you can see your primary care physician in his/her office.  Any patient who returns to the ED seeking refills should expect only non-narcotic pain medications.  ° °In the event of an acute medical condition exists and the emergency physician feels it is necessary that the patient be given a narcotic or sedating medication -  a responsible adult driver should be present in the room prior to the medication being given by the nurse. °  °Prescriptions for narcotic or sedating medications that have been lost, stolen or expired will not be refilled in the Emergency Department.   ° °Patients who have chronic pain may receive non-narcotic prescriptions until seen by their primary care physician.  It is every patient’s personal responsibility to maintain active prescriptions with his or her primary care   physician or specialist. °

## 2015-01-28 NOTE — ED Provider Notes (Signed)
CSN: [id number]     Arrival date & time 01/28/15  0906 History   First MD Initiated Contact with Patient 01/28/15 [phone]     Chief Complaint  Patient presents with  . Hip Pain     (Consider location/radiation/quality/duration/timing/severity/associated sxs/prior Treatment) HPI Comments: Pt from home. Pt has chronic pain in hips. Pt takes hydrocodone for the pain. States he has bone on bone grinding. Today the left hip is bothering him more. He states he was taking the trash out when he "tweaked his hip stepping the wrong way" and now has numbness and tingling to his anterior thigh. Patient denies falling or any other injury. He states "my mother had to carry me into the house." He is out of his pain medication. No modifying factors. Followed by Dr. Thea Silversmith for his pain.   Patient is a 49 y.o. male presenting with hip pain. The history is provided by the patient.  Hip Pain This is a chronic problem. The current episode started today. The problem occurs constantly. The problem has been unchanged. Pertinent negatives include no abdominal pain. The symptoms are aggravated by walking. He has tried oral narcotics for the symptoms. The treatment provided mild relief.    Past Medical History  Diagnosis Date  . Hypertension   . Carpal tunnel syndrome    Past Surgical History  Procedure Laterality Date  . Knee surgery     History reviewed. No pertinent family history. Social History  Substance Use Topics  . Smoking status: Current Every Day Smoker -- 0.25 packs/day    Types: Cigarettes  . Smokeless tobacco: None  . Alcohol Use: Yes    Review of Systems  Gastrointestinal: Negative for abdominal pain.  Musculoskeletal:       + Left hip pain  All other systems reviewed and are negative.     Allergies  Review of patient's allergies indicates no known allergies.  Home Medications   Prior to Admission medications   Medication Sig Start Date End Date Taking? Authorizing Provider   Oxycodone HCl 10 MG TABS Take 1 tablet by mouth 3 (three) times daily as needed (pain).  01/04/15  Yes Historical Provider, MD  diclofenac (CATAFLAM) 50 MG tablet Take 1 tablet (50 mg total) by mouth 3 (three) times daily. For hip pain. Patient not taking: Reported on 01/28/2015 02/02/14   Linna Hoff, MD  HYDROcodone-acetaminophen (NORCO/VICODIN) 5-325 MG per tablet Take 2 tablets by mouth every 4 (four) hours as needed. Patient not taking: Reported on 01/28/2015 09/16/14   Elson Areas, PA-C  meloxicam (MOBIC) 7.5 MG tablet Take 1 tablet (7.5 mg total) by mouth daily. Patient not taking: Reported on 01/28/2015 09/16/14   Elson Areas, PA-C  meloxicam (MOBIC) 7.5 MG tablet Take 2 tablets (15 mg total) by mouth daily. 01/28/15   Spirit Wernli, PA-C   BP 134/78 mmHg  Pulse 61  Temp(Src) 98 F (36.7 C) (Oral)  Resp 16  Ht 5\' 11"  (1.803 m)  Wt 237 lb (107.502 kg)  BMI 33.07 kg/m2  SpO2 100% Physical Exam  Constitutional: He is oriented to person, place, and time. He appears well-developed and well-nourished. No distress.  HENT:  Head: Normocephalic and atraumatic.  Right Ear: External ear normal.  Left Ear: External ear normal.  Nose: Nose normal.  Mouth/Throat: Oropharynx is clear and moist.  Eyes: Conjunctivae are normal.  Neck: Normal range of motion. Neck supple.  No nuchal rigidity.   Cardiovascular: Normal rate.   Pulmonary/Chest: Effort normal.  Abdominal: Soft.  Musculoskeletal: He exhibits no edema.  Increased pain of L hip with passive ROM.  Increased pain with active ROM of L hip.  NVI distal to L hip  Neurological: He is alert and oriented to person, place, and time.  Skin: Skin is warm and dry. He is not diaphoretic.  Psychiatric: He has a normal mood and affect.  Nursing note and vitals reviewed.   ED Course  Procedures (including critical care time) Medications  HYDROmorphone (DILAUDID) injection 2 mg (2 mg Intramuscular Given 01/28/15 0959)  ketorolac  (TORADOL) injection 60 mg (60 mg Intramuscular Given 01/28/15 1033)    Labs Review Labs Reviewed - No data to display  Imaging Review Dg Hip Unilat With Pelvis 2-3 Views Left  01/28/2015   CLINICAL DATA:  Twisting injury left hip this morning. Pain, limited range of motion and inability to bear weight. Initial encounter.  EXAM: DG HIP (WITH OR WITHOUT PELVIS) 2-3V LEFT  COMPARISON:  Plain films of the right hip 08/26/2013.  FINDINGS: No acute bony or joint abnormality is identified. The patient has markedly advanced for age bilateral hip osteoarthritis which appears slightly worse on the right. Prominent bony protrusions are seen along the anterior, superior aspects of the femoral necks bilaterally, larger on the right. The symphysis pubis and sacroiliac joints are unremarkable. No focal bony lesion is seen.  IMPRESSION: No acute abnormality.  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   Electronically Signed   By: Drusilla Kanner M.D.   On: 01/28/2015 10:03   I have personally reviewed and evaluated these images and lab results as part of my medical decision-making.   EKG Interpretation None      MDM   Final diagnoses:  Chronic hip pain, left    Filed Vitals:   01/28/15 1100  BP:   Pulse:   Temp: 98 F (36.7 C)  Resp:    Afebrile, NAD, non-toxic appearing, AAOx4.   Patient X-Ray negative for obvious fracture or dislocation. I personally reviewed the imaging and agree with the radiologist. Neurovascularly intact. Normal sensation. No evidence of compartment syndrome. Pain managed in ED. Pt advised to follow up with PCP if symptoms persist as well as for chronic pain. Patient given IM shots while while in ED, conservative therapy recommended and discussed. Patient will be dc home &  is agreeable with above plan.      Francee Piccolo, PA-C 01/28/15 1531  Pricilla Loveless, MD 01/29/15 [phone]

## 2015-01-28 NOTE — ED Notes (Signed)
RN ambulated pt with assistance. Pt able to put weight on left leg, however very painful. Pt only able to tolerate a few steps

## 2015-01-28 NOTE — ED Notes (Signed)
Pt from home. Pt has chronic pain in hips. Pt takes hydrocodone for the pain. States he has bone on bone grinding. Today the left hip is bothering him more and he is out of his pain medication. BP 160/102, HR 74. Pain 9/10.

## 2015-03-26 ENCOUNTER — Encounter (HOSPITAL_COMMUNITY)
Admission: RE | Admit: 2015-03-26 | Discharge: 2015-03-26 | Disposition: A | Discharge status: Home / Self Care | Payer: Medicaid Other | Source: Ambulatory Visit | Attending: Orthopaedic Surgery | Admitting: Orthopaedic Surgery

## 2015-03-26 ENCOUNTER — Encounter (HOSPITAL_COMMUNITY): Payer: Self-pay

## 2015-03-26 DIAGNOSIS — M160 Bilateral primary osteoarthritis of hip: Secondary | ICD-10-CM | POA: Insufficient documentation

## 2015-03-26 DIAGNOSIS — Z01812 Encounter for preprocedural laboratory examination: Secondary | ICD-10-CM | POA: Diagnosis not present

## 2015-03-26 HISTORY — DX: Unspecified osteoarthritis, unspecified site: M19.90

## 2015-03-26 LAB — BASIC METABOLIC PANEL
Anion gap: 7 (ref 5–15)
BUN: 15 mg/dL (ref 6–20)
CALCIUM: 9.5 mg/dL (ref 8.9–10.3)
CO2: 23 mmol/L (ref 22–32)
Chloride: 103 mmol/L (ref 101–111)
Creatinine, Ser: 1.24 mg/dL (ref 0.61–1.24)
GLUCOSE: 105 mg/dL — AB (ref 65–99)
POTASSIUM: 4 mmol/L (ref 3.5–5.1)
Sodium: 133 mmol/L — ABNORMAL LOW (ref 135–145)

## 2015-03-26 LAB — CBC
HCT: 46.6 % (ref 39.0–52.0)
HEMOGLOBIN: 15.6 g/dL (ref 13.0–17.0)
MCH: 30.2 pg (ref 26.0–34.0)
MCHC: 33.5 g/dL (ref 30.0–36.0)
MCV: 90.3 fL (ref 78.0–100.0)
Platelets: 217 10*3/uL (ref 150–400)
RBC: 5.16 MIL/uL (ref 4.22–5.81)
RDW: 13.9 % (ref 11.5–15.5)
WBC: 9.1 10*3/uL (ref 4.0–10.5)

## 2015-03-26 LAB — SURGICAL PCR SCREEN
MRSA, PCR: POSITIVE — AB
Staphylococcus aureus: POSITIVE — AB

## 2015-03-26 NOTE — Progress Notes (Signed)
   03/26/15 1140  OBSTRUCTIVE SLEEP APNEA  Have you ever been diagnosed with sleep apnea through a sleep study? No  Do you snore loudly (loud enough to be heard through closed doors)?  1  Do you often feel tired, fatigued, or sleepy during the daytime (such as falling asleep during driving or talking to someone)? 0  Has anyone observed you stop breathing during your sleep? 1  Do you have, or are you being treated for high blood pressure? 1  BMI more than 35 kg/m2? 0  Age > 50 (1-yes) 0  Neck circumference greater than:Male 16 inches or larger, Male 17inches or larger? 1 10(18)  Male Gender (Yes=1) 1  Obstructive Sleep Apnea Score 5

## 2015-03-26 NOTE — Pre-Procedure Instructions (Addendum)
Spencer Fischer  03/26/2015      [id number] - [place] ([place]), [place] - [address] [place] ([place]) [place] [address] Phone: [phone] Fax: [phone]  [id number] - [place], [place] - [address] [place] [address] Phone: [phone] Fax: [phone]  [id number] [place], [place] - [address] AT [facility] [address] [place] [address] Phone: [phone] Fax: [phone]    Your procedure is scheduled on 04/06/15  Report to [facility] short stay admitting at 1100 A.M.  Call this number if you have problems the morning of surgery:  [phone]   Remember:  Do not eat food or drink liquids after midnight.  Take these medicines the morning of surgery with A SIP OF WATER oxycodone  STOP all herbel meds, nsaids (aleve,naproxen,advil,ibuprofen) 5 days prior to surgery 04/01/15 including vitamins, aspirin,   Do not wear jewelry, make-up or nail polish.  Do not wear lotions, powders, or perfumes.  You may wear deodorant.  Do not shave 48 hours prior to surgery.  Men may shave face and neck.  Do not bring valuables to the hospital.  [facility] is not responsible for any belongings or valuables.  Contacts, dentures or bridgework may not be worn into surgery.  Leave your suitcase in the car.  After surgery it may be brought to your room.  For patients admitted to the hospital, discharge time will be determined by your treatment team.  Patients discharged the day of surgery will not be allowed to drive home.   Name and phone number of your driver:    Special instructions:   Special Instructions: [facility] - Preparing for Surgery  Before surgery, you can play an important role.  Because skin is not sterile, your skin needs to be as free of germs as possible.  You can reduce the number of germs on you skin by washing with CHG (chlorahexidine gluconate) soap  before surgery.  CHG is an antiseptic cleaner which kills germs and bonds with the skin to continue killing germs even after washing.  Please DO NOT use if you have an allergy to CHG or antibacterial soaps.  If your skin becomes reddened/irritated stop using the CHG and inform your nurse when you arrive at Short Stay.  Do not shave (including legs and underarms) for at least 48 hours prior to the first CHG shower.  You may shave your face.  Please follow these instructions carefully:   1.  Shower with CHG Soap the night before surgery and the morning of Surgery.  2.  If you choose to wash your hair, wash your hair first as usual with your normal shampoo.  3.  After you shampoo, rinse your hair and body thoroughly to remove the Shampoo.  4.  Use CHG as you would any other liquid soap.  You can apply chg directly  to the skin and wash gently with scrungie or a clean washcloth.  5.  Apply the CHG Soap to your body ONLY FROM THE NECK DOWN.  Do not use on open wounds or open sores.  Avoid contact with your eyes ears, mouth and genitals (private parts).  Wash genitals (private parts)       with your normal soap.  6.  Wash thoroughly, paying special attention to the area where your surgery will be performed.  7.  Thoroughly rinse your body with warm water from the neck  down.  8.  DO NOT shower/wash with your normal soap after using and rinsing off the CHG Soap.  9.  Pat yourself dry with a clean towel.            10.  Wear clean pajamas.            11.  Place clean sheets on your bed the night of your first shower and do not sleep with pets.  Day of Surgery  Do not apply any lotions/deodorants the morning of surgery.  Please wear clean clothes to the hospital/surgery center.  Please read over the following fact sheets that you were given. Pain Booklet, Coughing and Deep Breathing, Total Joint Packet, MRSA Information and Surgical Site Infection Prevention

## 2015-04-05 MED ORDER — CEFAZOLIN SODIUM-DEXTROSE 2-3 GM-% IV SOLR
2.0000 g | INTRAVENOUS | Status: AC
  Administered 2015-04-06: 2 g via INTRAVENOUS
  Filled 2015-04-05: qty 50

## 2015-04-05 MED ORDER — TRANEXAMIC ACID 1000 MG/10ML IV SOLN
1000.0000 mg | INTRAVENOUS | Status: AC
  Administered 2015-04-06: 1000 mg via INTRAVENOUS
  Filled 2015-04-05: qty 10

## 2015-04-05 NOTE — Progress Notes (Signed)
Pt notified of time change.To arrive at 10:15 AM.Verbalized understanding.

## 2015-04-06 ENCOUNTER — Encounter (HOSPITAL_COMMUNITY)
Admission: RE | Disposition: A | Discharge status: Rehabilitation Facility | Payer: Self-pay | Source: Ambulatory Visit | Attending: Orthopaedic Surgery

## 2015-04-06 ENCOUNTER — Inpatient Hospital Stay (HOSPITAL_COMMUNITY): Payer: Medicaid Other

## 2015-04-06 ENCOUNTER — Inpatient Hospital Stay (HOSPITAL_COMMUNITY): Payer: Medicaid Other | Admitting: Certified Registered Nurse Anesthetist

## 2015-04-06 ENCOUNTER — Encounter (HOSPITAL_COMMUNITY): Payer: Self-pay | Admitting: *Deleted

## 2015-04-06 ENCOUNTER — Inpatient Hospital Stay (HOSPITAL_COMMUNITY)
Admission: RE | Admit: 2015-04-06 | Discharge: 2015-04-09 | DRG: 462 | Disposition: A | Discharge status: Rehabilitation Facility | Payer: Medicaid Other | Source: Ambulatory Visit | Attending: Orthopaedic Surgery | Admitting: Orthopaedic Surgery

## 2015-04-06 DIAGNOSIS — F17210 Nicotine dependence, cigarettes, uncomplicated: Secondary | ICD-10-CM | POA: Diagnosis present

## 2015-04-06 DIAGNOSIS — I10 Essential (primary) hypertension: Secondary | ICD-10-CM | POA: Diagnosis present

## 2015-04-06 DIAGNOSIS — N179 Acute kidney failure, unspecified: Secondary | ICD-10-CM | POA: Diagnosis not present

## 2015-04-06 DIAGNOSIS — M25559 Pain in unspecified hip: Secondary | ICD-10-CM | POA: Diagnosis present

## 2015-04-06 DIAGNOSIS — F419 Anxiety disorder, unspecified: Secondary | ICD-10-CM | POA: Diagnosis present

## 2015-04-06 DIAGNOSIS — Z419 Encounter for procedure for purposes other than remedying health state, unspecified: Secondary | ICD-10-CM

## 2015-04-06 DIAGNOSIS — D62 Acute posthemorrhagic anemia: Secondary | ICD-10-CM | POA: Diagnosis not present

## 2015-04-06 DIAGNOSIS — M1612 Unilateral primary osteoarthritis, left hip: Secondary | ICD-10-CM

## 2015-04-06 DIAGNOSIS — M160 Bilateral primary osteoarthritis of hip: Secondary | ICD-10-CM | POA: Diagnosis present

## 2015-04-06 DIAGNOSIS — G8918 Other acute postprocedural pain: Secondary | ICD-10-CM | POA: Diagnosis not present

## 2015-04-06 DIAGNOSIS — Z590 Homelessness: Secondary | ICD-10-CM | POA: Diagnosis not present

## 2015-04-06 DIAGNOSIS — Z6832 Body mass index (BMI) 32.0-32.9, adult: Secondary | ICD-10-CM | POA: Diagnosis not present

## 2015-04-06 DIAGNOSIS — Z96643 Presence of artificial hip joint, bilateral: Secondary | ICD-10-CM

## 2015-04-06 HISTORY — PX: BILATERAL ANTERIOR TOTAL HIP ARTHROPLASTY: SHX5567

## 2015-04-06 HISTORY — DX: Anxiety disorder, unspecified: F41.9

## 2015-04-06 SURGERY — ARTHROPLASTY, HIP, BILATERAL, TOTAL, ANTERIOR APPROACH
Anesthesia: Spinal | Site: Hip | Laterality: Bilateral

## 2015-04-06 MED ORDER — ONDANSETRON HCL 4 MG PO TABS: 4.0000 mg | ORAL_TABLET | Freq: Four times a day (QID) | ORAL | Status: DC | PRN

## 2015-04-06 MED ORDER — ROPIVACAINE HCL 2 MG/ML IJ SOLN
5.0000 mL/h | INTRAMUSCULAR | Status: DC
  Administered 2015-04-06: 5 mL/h via EPIDURAL
  Filled 2015-04-06 (×3): qty 200

## 2015-04-06 MED ORDER — POLYETHYLENE GLYCOL 3350 17 G PO PACK
17.0000 g | PACK | Freq: Every day | ORAL | Status: DC | PRN
Start: 2015-04-06 — End: 2015-04-09

## 2015-04-06 MED ORDER — LACTATED RINGERS IV SOLN
INTRAVENOUS | Status: DC
  Administered 2015-04-06: 12:00:00 via INTRAVENOUS

## 2015-04-06 MED ORDER — PROPOFOL 10 MG/ML IV BOLUS
INTRAVENOUS | Status: AC
  Filled 2015-04-06: qty 20

## 2015-04-06 MED ORDER — ROPIVACAINE HCL 2 MG/ML IJ SOLN
INTRAMUSCULAR | Status: DC | PRN
  Administered 2015-04-06: 5 mL/h via EPIDURAL

## 2015-04-06 MED ORDER — BUPIVACAINE HCL (PF) 0.5 % IJ SOLN
INTRAMUSCULAR | Status: DC | PRN
  Administered 2015-04-06: 3 mL via INTRATHECAL

## 2015-04-06 MED ORDER — SODIUM CHLORIDE 0.9 % IR SOLN
Status: DC | PRN
  Administered 2015-04-06: 3000 mL

## 2015-04-06 MED ORDER — 0.9 % SODIUM CHLORIDE (POUR BTL) OPTIME
TOPICAL | Status: DC | PRN
Start: 2015-04-06 — End: 2015-04-06
  Administered 2015-04-06: 1000 mL

## 2015-04-06 MED ORDER — HYDROMORPHONE HCL 1 MG/ML IJ SOLN: 1.0000 mg | INTRAMUSCULAR | Status: DC | PRN

## 2015-04-06 MED ORDER — LACTATED RINGERS IV SOLN
INTRAVENOUS | Status: DC | PRN
  Administered 2015-04-06 (×4): via INTRAVENOUS

## 2015-04-06 MED ORDER — METHOCARBAMOL 1000 MG/10ML IJ SOLN
500.0000 mg | Freq: Four times a day (QID) | INTRAVENOUS | Status: DC | PRN
  Filled 2015-04-06: qty 5

## 2015-04-06 MED ORDER — PROPOFOL 500 MG/50ML IV EMUL
INTRAVENOUS | Status: DC | PRN
  Administered 2015-04-06: 75 ug/kg/min via INTRAVENOUS

## 2015-04-06 MED ORDER — MEPERIDINE HCL 25 MG/ML IJ SOLN
6.2500 mg | INTRAMUSCULAR | Status: DC | PRN
Start: 2015-04-06 — End: 2015-04-06

## 2015-04-06 MED ORDER — SODIUM CHLORIDE 0.9 % IJ SOLN
INTRAMUSCULAR | Status: AC
  Filled 2015-04-06: qty 10

## 2015-04-06 MED ORDER — ZOLPIDEM TARTRATE 5 MG PO TABS: 5.0000 mg | ORAL_TABLET | Freq: Every evening | ORAL | Status: DC | PRN

## 2015-04-06 MED ORDER — ONDANSETRON HCL 4 MG/2ML IJ SOLN: 4.0000 mg | Freq: Four times a day (QID) | INTRAMUSCULAR | Status: DC | PRN

## 2015-04-06 MED ORDER — DIPHENHYDRAMINE HCL 12.5 MG/5ML PO ELIX: 12.5000 mg | ORAL_SOLUTION | ORAL | Status: DC | PRN

## 2015-04-06 MED ORDER — MEPERIDINE HCL 25 MG/ML IJ SOLN
INTRAMUSCULAR | Status: AC
  Filled 2015-04-06: qty 1

## 2015-04-06 MED ORDER — DOCUSATE SODIUM 100 MG PO CAPS
100.0000 mg | ORAL_CAPSULE | Freq: Two times a day (BID) | ORAL | Status: DC
  Administered 2015-04-06 – 2015-04-09 (×6): 100 mg via ORAL
  Filled 2015-04-06 (×4): qty 1

## 2015-04-06 MED ORDER — METHOCARBAMOL 500 MG PO TABS
500.0000 mg | ORAL_TABLET | Freq: Four times a day (QID) | ORAL | Status: DC | PRN
Start: 2015-04-06 — End: 2015-04-09
  Administered 2015-04-06 – 2015-04-09 (×11): 500 mg via ORAL
  Filled 2015-04-06 (×11): qty 1

## 2015-04-06 MED ORDER — HYDROMORPHONE HCL 1 MG/ML IJ SOLN: 0.2500 mg | INTRAMUSCULAR | Status: DC | PRN

## 2015-04-06 MED ORDER — METOCLOPRAMIDE HCL 5 MG PO TABS: 5.0000 mg | ORAL_TABLET | Freq: Three times a day (TID) | ORAL | Status: DC | PRN

## 2015-04-06 MED ORDER — SUCCINYLCHOLINE CHLORIDE 20 MG/ML IJ SOLN
INTRAMUSCULAR | Status: AC
  Filled 2015-04-06: qty 1

## 2015-04-06 MED ORDER — MENTHOL 3 MG MT LOZG: 1.0000 | LOZENGE | OROMUCOSAL | Status: DC | PRN

## 2015-04-06 MED ORDER — SODIUM CHLORIDE 0.9 % IV SOLN
INTRAVENOUS | Status: DC
  Administered 2015-04-06: 75 mL/h via INTRAVENOUS

## 2015-04-06 MED ORDER — MIDAZOLAM HCL 5 MG/5ML IJ SOLN
INTRAMUSCULAR | Status: DC | PRN
  Administered 2015-04-06: 2 mg via INTRAVENOUS

## 2015-04-06 MED ORDER — ONDANSETRON HCL 4 MG/2ML IJ SOLN
INTRAMUSCULAR | Status: AC
  Filled 2015-04-06: qty 2

## 2015-04-06 MED ORDER — ACETAMINOPHEN 325 MG PO TABS: 650.0000 mg | ORAL_TABLET | Freq: Four times a day (QID) | ORAL | Status: DC | PRN

## 2015-04-06 MED ORDER — PHENYLEPHRINE HCL 10 MG/ML IJ SOLN
10.0000 mg | INTRAVENOUS | Status: DC | PRN
  Administered 2015-04-06: 40 ug/min via INTRAVENOUS

## 2015-04-06 MED ORDER — GLYCOPYRROLATE 0.2 MG/ML IJ SOLN
INTRAMUSCULAR | Status: DC | PRN
  Administered 2015-04-06: 0.1 mg via INTRAVENOUS

## 2015-04-06 MED ORDER — PHENYLEPHRINE HCL 10 MG/ML IJ SOLN
INTRAMUSCULAR | Status: DC | PRN
  Administered 2015-04-06: 120 ug via INTRAVENOUS
  Administered 2015-04-06: 80 ug via INTRAVENOUS
  Administered 2015-04-06: 120 ug via INTRAVENOUS
  Administered 2015-04-06: 80 ug via INTRAVENOUS

## 2015-04-06 MED ORDER — VANCOMYCIN HCL IN DEXTROSE 1-5 GM/200ML-% IV SOLN
1000.0000 mg | Freq: Two times a day (BID) | INTRAVENOUS | Status: DC
  Filled 2015-04-06: qty 200

## 2015-04-06 MED ORDER — VANCOMYCIN HCL IN DEXTROSE 1-5 GM/200ML-% IV SOLN
1000.0000 mg | Freq: Two times a day (BID) | INTRAVENOUS | Status: AC
  Administered 2015-04-06: 1000 mg via INTRAVENOUS
  Filled 2015-04-06: qty 200

## 2015-04-06 MED ORDER — TRIAMTERENE-HCTZ 37.5-25 MG PO TABS
1.0000 | ORAL_TABLET | Freq: Every day | ORAL | Status: DC
  Administered 2015-04-06 – 2015-04-09 (×4): 1 via ORAL
  Filled 2015-04-06 (×4): qty 1

## 2015-04-06 MED ORDER — ROCURONIUM BROMIDE 50 MG/5ML IV SOLN
INTRAVENOUS | Status: AC
  Filled 2015-04-06: qty 1

## 2015-04-06 MED ORDER — OXYCODONE HCL 5 MG/5ML PO SOLN: 5.0000 mg | Freq: Once | ORAL | Status: DC | PRN

## 2015-04-06 MED ORDER — VANCOMYCIN HCL 1000 MG IV SOLR
1000.0000 mg | INTRAVENOUS | Status: DC | PRN
  Administered 2015-04-06: 1000 mg via INTRAVENOUS

## 2015-04-06 MED ORDER — HYDROMORPHONE HCL 1 MG/ML IJ SOLN
0.2500 mg | INTRAMUSCULAR | Status: DC | PRN
Start: 2015-04-06 — End: 2015-04-06

## 2015-04-06 MED ORDER — PHENYLEPHRINE 40 MCG/ML (10ML) SYRINGE FOR IV PUSH (FOR BLOOD PRESSURE SUPPORT)
PREFILLED_SYRINGE | INTRAVENOUS | Status: AC
  Filled 2015-04-06: qty 10

## 2015-04-06 MED ORDER — MEPERIDINE HCL 25 MG/ML IJ SOLN
6.2500 mg | INTRAMUSCULAR | Status: DC | PRN
Start: 2015-04-06 — End: 2015-04-06
  Administered 2015-04-06 (×2): 12.5 mg via INTRAVENOUS

## 2015-04-06 MED ORDER — METOCLOPRAMIDE HCL 5 MG/ML IJ SOLN: 5.0000 mg | Freq: Three times a day (TID) | INTRAMUSCULAR | Status: DC | PRN

## 2015-04-06 MED ORDER — MIDAZOLAM HCL 2 MG/2ML IJ SOLN
INTRAMUSCULAR | Status: AC
  Filled 2015-04-06: qty 4

## 2015-04-06 MED ORDER — PHENOL 1.4 % MT LIQD: 1.0000 | OROMUCOSAL | Status: DC | PRN

## 2015-04-06 MED ORDER — OXYCODONE HCL 5 MG PO TABS
5.0000 mg | ORAL_TABLET | ORAL | Status: DC | PRN
  Administered 2015-04-07 – 2015-04-09 (×17): 15 mg via ORAL
  Filled 2015-04-06 (×17): qty 3

## 2015-04-06 MED ORDER — FENTANYL CITRATE (PF) 250 MCG/5ML IJ SOLN
INTRAMUSCULAR | Status: AC
  Filled 2015-04-06: qty 5

## 2015-04-06 MED ORDER — ASPIRIN EC 325 MG PO TBEC
325.0000 mg | DELAYED_RELEASE_TABLET | Freq: Two times a day (BID) | ORAL | Status: DC
  Administered 2015-04-07 – 2015-04-09 (×5): 325 mg via ORAL
  Filled 2015-04-06 (×5): qty 1

## 2015-04-06 MED ORDER — ALBUMIN HUMAN 5 % IV SOLN
INTRAVENOUS | Status: DC | PRN
  Administered 2015-04-06: 14:00:00 via INTRAVENOUS

## 2015-04-06 MED ORDER — FENTANYL CITRATE (PF) 100 MCG/2ML IJ SOLN
INTRAMUSCULAR | Status: DC | PRN
  Administered 2015-04-06: 100 ug via INTRAVENOUS

## 2015-04-06 MED ORDER — ACETAMINOPHEN 650 MG RE SUPP: 650.0000 mg | Freq: Four times a day (QID) | RECTAL | Status: DC | PRN

## 2015-04-06 MED ORDER — ALUM & MAG HYDROXIDE-SIMETH 200-200-20 MG/5ML PO SUSP: 30.0000 mL | ORAL | Status: DC | PRN

## 2015-04-06 MED ORDER — OXYCODONE HCL 5 MG PO TABS: 5.0000 mg | ORAL_TABLET | Freq: Once | ORAL | Status: DC | PRN

## 2015-04-06 SURGICAL SUPPLY — 64 items
APL SKNCLS STERI-STRIP NONHPOA (GAUZE/BANDAGES/DRESSINGS)
BENZOIN TINCTURE PRP APPL 2/3 (GAUZE/BANDAGES/DRESSINGS) ×2 IMPLANT
BLADE SAW SGTL 18X1.27X75 (BLADE) ×3 IMPLANT
BLADE SAW SGTL 18X1.27X75MM (BLADE) ×2
BLADE SURG 10 STRL SS (BLADE) ×2 IMPLANT
BLADE SURG ROTATE 9660 (MISCELLANEOUS) IMPLANT
CAPT HIP TOTAL 2 ×4 IMPLANT
CELLS DAT CNTRL 66122 CELL SVR (MISCELLANEOUS) ×2 IMPLANT
CLOSURE STERI-STRIP 1/2X4 (GAUZE/BANDAGES/DRESSINGS)
CLSR STERI-STRIP ANTIMIC 1/2X4 (GAUZE/BANDAGES/DRESSINGS) ×2 IMPLANT
COVER PERINEAL POST (MISCELLANEOUS) ×3 IMPLANT
COVER SURGICAL LIGHT HANDLE (MISCELLANEOUS) ×3 IMPLANT
DRAPE C-ARM 42X72 X-RAY (DRAPES) ×6 IMPLANT
DRAPE IMP U-DRAPE 54X76 (DRAPES) ×5 IMPLANT
DRAPE STERI IOBAN 125X83 (DRAPES) ×6 IMPLANT
DRAPE U-SHAPE 47X51 STRL (DRAPES) ×19 IMPLANT
DRSG AQUACEL AG ADV 3.5X10 (GAUZE/BANDAGES/DRESSINGS) ×6 IMPLANT
DURAPREP 26ML APPLICATOR (WOUND CARE) ×6 IMPLANT
ELECT BLADE 4.0 EZ CLEAN MEGAD (MISCELLANEOUS) ×3
ELECT BLADE TIP CTD 4 INCH (ELECTRODE) ×3 IMPLANT
ELECT CAUTERY BLADE 6.4 (BLADE) IMPLANT
ELECT REM PT RETURN 9FT ADLT (ELECTROSURGICAL) ×3
ELECTRODE BLDE 4.0 EZ CLN MEGD (MISCELLANEOUS) IMPLANT
ELECTRODE REM PT RTRN 9FT ADLT (ELECTROSURGICAL) ×1 IMPLANT
FACESHIELD WRAPAROUND (MASK) ×12 IMPLANT
FACESHIELD WRAPAROUND OR TEAM (MASK) ×4 IMPLANT
GAUZE XEROFORM 1X8 LF (GAUZE/BANDAGES/DRESSINGS) ×4 IMPLANT
GLOVE BIOGEL PI IND STRL 8 (GLOVE) ×4 IMPLANT
GLOVE BIOGEL PI INDICATOR 8 (GLOVE) ×8
GLOVE ORTHO TXT STRL SZ7.5 (GLOVE) ×6 IMPLANT
GLOVE SURG ORTHO 8.0 STRL STRW (GLOVE) ×6 IMPLANT
GOWN STRL REUS W/ TWL LRG LVL3 (GOWN DISPOSABLE) ×1 IMPLANT
GOWN STRL REUS W/ TWL XL LVL3 (GOWN DISPOSABLE) ×4 IMPLANT
GOWN STRL REUS W/TWL LRG LVL3 (GOWN DISPOSABLE) ×3
GOWN STRL REUS W/TWL XL LVL3 (GOWN DISPOSABLE) ×6
HANDPIECE INTERPULSE COAX TIP (DISPOSABLE) ×3
KIT BASIN OR (CUSTOM PROCEDURE TRAY) ×3 IMPLANT
KIT ROOM TURNOVER OR (KITS) ×3 IMPLANT
LINER BOOT UNIVERSAL DISP (MISCELLANEOUS) ×1 IMPLANT
MANIFOLD NEPTUNE II (INSTRUMENTS) ×3 IMPLANT
NS IRRIG 1000ML POUR BTL (IV SOLUTION) ×3 IMPLANT
PACK TOTAL JOINT (CUSTOM PROCEDURE TRAY) ×3 IMPLANT
PACK UNIVERSAL I (CUSTOM PROCEDURE TRAY) ×6 IMPLANT
PAD ARMBOARD 7.5X6 YLW CONV (MISCELLANEOUS) ×6 IMPLANT
PENCIL BUTTON HOLSTER BLD 10FT (ELECTRODE) ×2 IMPLANT
RETRACTOR WND ALEXIS 18 MED (MISCELLANEOUS) ×2 IMPLANT
RTRCTR WOUND ALEXIS 18CM MED (MISCELLANEOUS) ×6
SET HNDPC FAN SPRY TIP SCT (DISPOSABLE) ×1 IMPLANT
SPONGE LAP 18X18 X RAY DECT (DISPOSABLE) ×9 IMPLANT
SPONGE LAP 4X18 X RAY DECT (DISPOSABLE) IMPLANT
STAPLER SKIN PROX WIDE 3.9 (STAPLE) ×1 IMPLANT
STAPLER VISISTAT 35W (STAPLE) ×6 IMPLANT
SUT ETHIBOND NAB CT1 #1 30IN (SUTURE) ×8 IMPLANT
SUT MNCRL AB 4-0 PS2 18 (SUTURE) ×6 IMPLANT
SUT VIC AB 0 CT1 27 (SUTURE) ×3
SUT VIC AB 0 CT1 27XBRD ANBCTR (SUTURE) ×2 IMPLANT
SUT VIC AB 1 CT1 27 (SUTURE) ×9
SUT VIC AB 1 CT1 27XBRD ANBCTR (SUTURE) ×2 IMPLANT
SUT VIC AB 2-0 CT1 27 (SUTURE) ×9
SUT VIC AB 2-0 CT1 TAPERPNT 27 (SUTURE) ×2 IMPLANT
TOWEL OR 17X24 6PK STRL BLUE (TOWEL DISPOSABLE) ×3 IMPLANT
TOWEL OR 17X26 10 PK STRL BLUE (TOWEL DISPOSABLE) ×3 IMPLANT
TRAY FOLEY CATH 16FRSI W/METER (SET/KITS/TRAYS/PACK) ×2 IMPLANT
WATER STERILE IRR 1000ML POUR (IV SOLUTION) ×2 IMPLANT

## 2015-04-06 NOTE — H&P (Signed)
TOTAL HIP ADMISSION H&P  Patient is admitted for bilaterally total hip arthroplasty.  Subjective:  Chief Complaint: bilaterally hip pain  HPI: Spencer Fischer, 49 y.o. male, has a history of pain and functional disability in the bilaterally hip(s) due to arthritis and patient has failed non-surgical conservative treatments for greater than 12 weeks to include NSAID's and/or analgesics, corticosteriod injections, flexibility and strengthening excercises, use of assistive devices, weight reduction as appropriate and activity modification.  Onset of symptoms was gradual starting 5 years ago with gradually worsening course since that time.The patient noted no past surgery on the bilaterally hip(s).  Patient currently rates pain in the bilaterally hip at 10 out of 10 with activity. Patient has night pain, worsening of pain with activity and weight bearing, pain that interfers with activities of daily living and pain with passive range of motion. Patient has evidence of subchondral cysts, subchondral sclerosis, periarticular osteophytes and joint space narrowing by imaging studies. This condition presents safety issues increasing the risk of falls.  There is no current active infection.  Patient Active Problem List   Diagnosis Date Noted  . Osteoarthritis of bilateral hips 04/06/2015   Past Medical History  Diagnosis Date  . Hypertension   . Carpal tunnel syndrome   . Arthritis   . Anxiety     Past Surgical History  Procedure Laterality Date  . Knee surgery Left     Prescriptions prior to admission  Medication Sig Dispense Refill Last Dose  . HYDROcodone-acetaminophen (NORCO/VICODIN) 5-325 MG per tablet Take 2 tablets by mouth every 4 (four) hours as needed. 10 tablet 0 04/05/2015 at Unknown time  . triamterene-hydrochlorothiazide (MAXZIDE-25) 37.5-25 MG tablet Take 1 tablet by mouth daily.  1 04/05/2015 at Unknown time   No Known Allergies  Social History  Substance Use Topics  . Smoking  status: Current Every Day Smoker -- 0.25 packs/day    Types: Cigarettes  . Smokeless tobacco: Not on file  . Alcohol Use: 3.6 oz/week    6 Cans of beer per week    History reviewed. No pertinent family history.   Review of Systems  Musculoskeletal: Positive for joint pain.  All other systems reviewed and are negative.   Objective:  Physical Exam  Constitutional: He is oriented to person, place, and time. He appears well-developed and well-nourished.  HENT:  Head: Normocephalic and atraumatic.  Eyes: EOM are normal. Pupils are equal, round, and reactive to light.  Neck: Normal range of motion. Neck supple.  Cardiovascular: Normal rate and regular rhythm.   Respiratory: Effort normal. He has wheezes.  GI: Soft. Bowel sounds are normal.  Musculoskeletal:       Right hip: He exhibits decreased range of motion, decreased strength, tenderness and bony tenderness.       Left hip: He exhibits decreased range of motion, decreased strength, tenderness and bony tenderness.  Neurological: He is alert and oriented to person, place, and time.  Skin: Skin is warm and dry.  Psychiatric: He has a normal mood and affect.    Vital signs in last 24 hours: Temp:  [97.5 F (36.4 C)] 97.5 F (36.4 C) (10/25 1049) Pulse Rate:  [83] 83 (10/25 1049) Resp:  [20] 20 (10/25 1156) BP: (151-152)/(89-101) 152/89 mmHg (10/25 1156) SpO2:  [99 %-100 %] 100 % (10/25 1156) Weight:  [104.781 kg (231 lb)] 104.781 kg (231 lb) (10/25 1049)  Labs:   Estimated body mass index is 32.23 kg/(m^2) as calculated from the following:   Height as  of this encounter:  (1.803 m).   Weight as of this encounter: 104.781 kg (231 lb).   Imaging Review Plain radiographs demonstrate severe degenerative joint disease of the bilateral hip(s). The bone quality appears to be good for age and reported activity level.  Assessment/Plan:  End stage arthritis, bilaterally hip(s)  The patient history, physical examination,  clinical judgement of the provider and imaging studies are consistent with end stage degenerative joint disease of the bilaterally hip(s) and total hip arthroplasty is deemed medically necessary. The treatment options including medical management, injection therapy, arthroscopy and arthroplasty were discussed at length. The risks and benefits of total hip arthroplasty were presented and reviewed. The risks due to aseptic loosening, infection, stiffness, dislocation/subluxation,  thromboembolic complications and other imponderables were discussed.  The patient acknowledged the explanation, agreed to proceed with the plan and consent was signed. Patient is being admitted for inpatient treatment for surgery, pain control, PT, OT, prophylactic antibiotics, VTE prophylaxis, progressive ambulation and ADL's and discharge planning.The patient is planning to be discharged home with home health services

## 2015-04-06 NOTE — Transfer of Care (Signed)
Immediate Anesthesia Transfer of Care Note  Patient: Spencer Fischer  Procedure(s) Performed: Procedure(s): BILATERAL ANTERIOR TOTAL HIP ARTHROPLASTY (Bilateral)  Patient Location: PACU  Anesthesia Type:MAC, Spinal and Epidural  Level of Consciousness: awake  Airway & Oxygen Therapy: Patient Spontanous Breathing  Post-op Assessment: Report given to RN and Post -op Vital signs reviewed and stable  Post vital signs: Reviewed and stable  Last Vitals:  Filed Vitals:   04/06/15 1156  BP: 152/89  Pulse:   Temp:   Resp: 20    Complications: No apparent anesthesia complications

## 2015-04-06 NOTE — Anesthesia Procedure Notes (Signed)
Epidural Patient location during procedure: pre-op  Staffing Anesthesiologist: Alizae Bechtel Performed by: anesthesiologist   Preanesthetic Checklist Completed: patient identified, surgical consent, pre-op evaluation, timeout performed, IV checked, risks and benefits discussed and monitors and equipment checked  Epidural Patient position: sitting Prep: DuraPrep Patient monitoring: heart rate, cardiac monitor, continuous pulse ox and blood pressure Approach: midline Location: L3-L4 Injection technique: LOR saline  Needle:  Needle type: Tuohy  Needle gauge: 17 G Needle length: 9 cm Needle insertion depth: 8 cm Catheter type: closed end flexible Catheter size: 19 Gauge Catheter at skin depth: 13 cm  Assessment Events: blood not aspirated, injection not painful, no injection resistance and no paresthesia  Additional Notes Reason for block:procedure for pain  Spinal Patient location during procedure: pre-op Staffing Anesthesiologist: Alexxis Mackert Preanesthetic Checklist Completed: patient identified, surgical consent, pre-op evaluation, timeout performed, IV checked, risks and benefits discussed and monitors and equipment checked Spinal Block Patient position: sitting Prep: site prepped and draped and DuraPrep Patient monitoring: heart rate, cardiac monitor, continuous pulse ox and blood pressure Approach: midline Location: L3-4 Injection technique: single-shot Needle Needle type: Whitacre  Needle gauge: 25 G Needle length: 10 cm Assessment Sensory level: T8

## 2015-04-06 NOTE — Brief Op Note (Signed)
04/06/2015  4:03 PM  PATIENT:  Anna GenreKevin L Snoke  49 y.o. male  PRE-OPERATIVE DIAGNOSIS:  Severe osteoarthritis bilateral hips  POST-OPERATIVE DIAGNOSIS:  Severe osteoarthritis bilateral hips  PROCEDURE:  Procedure(s): BILATERAL ANTERIOR TOTAL HIP ARTHROPLASTY (Bilateral)  SURGEON:  Surgeon(s) and Role:    * Kathryne Hitchhristopher Y Katyana Trolinger, MD - Primary  PHYSICIAN ASSISTANT: Rexene EdisonGil Clark, PA-C  ANESTHESIA:   epidural and spinal  EBL:  Total I/O In: 3500 [I.V.:3000; IV Piggyback:500] Out: 665 [Urine:165; Blood:500]  BLOOD ADMINISTERED:none  DRAINS: none   LOCAL MEDICATIONS USED:  NONE  SPECIMEN:  No Specimen  DISPOSITION OF SPECIMEN:  N/A  COUNTS:  YES  TOURNIQUET:  * No tourniquets in log *  DICTATION: .Other Dictation: Dictation Number [id number]  PLAN OF CARE: Admit to inpatient   PATIENT DISPOSITION:  PACU - hemodynamically stable.   Delay start of Pharmacological VTE agent (>24hrs) due to surgical blood loss or risk of bleeding: no

## 2015-04-06 NOTE — Progress Notes (Addendum)
Dr. Maple HudsonMoser aware pt. Ate 1/2 sanwhich @ 0100 and drank 4 oz water  @ 0500.

## 2015-04-06 NOTE — Anesthesia Postprocedure Evaluation (Signed)
  Anesthesia Post-op Note  Patient: Spencer Fischer  Procedure(s) Performed: Procedure(s): BILATERAL ANTERIOR TOTAL HIP ARTHROPLASTY (Bilateral)  Patient Location: PACU  Anesthesia Type: Combined Spinal and Epidural   Level of Consciousness: awake, alert  and oriented  Airway and Oxygen Therapy: Patient Spontanous Breathing  Post-op Pain: none  Post-op Assessment: Post-op Vital signs reviewed  Post-op Vital Signs: Reviewed  Last Vitals:  Filed Vitals:   04/06/15 1834  BP:   Pulse: 86  Temp: 36.5 C  Resp: 15    Complications: No apparent anesthesia complications

## 2015-04-06 NOTE — Anesthesia Preprocedure Evaluation (Addendum)
Anesthesia Evaluation  Patient identified by MRN, date of birth, ID band Patient awake    Reviewed: Allergy & Precautions, NPO status , Patient's Chart, lab work & pertinent test results  History of Anesthesia Complications Negative for: history of anesthetic complications  Airway Mallampati: II  TM Distance: >3 FB Neck ROM: Full    Dental  (+) Teeth Intact, Dental Advisory Given   Pulmonary Current Smoker,    breath sounds clear to auscultation       Cardiovascular hypertension, Pt. on medications  Rhythm:Regular     Neuro/Psych Anxiety  Neuromuscular disease    GI/Hepatic negative GI ROS, Neg liver ROS,   Endo/Other  Morbid obesity  Renal/GU negative Renal ROS     Musculoskeletal  (+) Arthritis , Osteoarthritis,    Abdominal   Peds  Hematology negative hematology ROS (+)   Anesthesia Other Findings   Reproductive/Obstetrics                            Anesthesia Physical Anesthesia Plan  ASA: II  Anesthesia Plan: Combined Spinal and Epidural   Post-op Pain Management:    Induction:   Airway Management Planned: Mask  Additional Equipment: None  Intra-op Plan:   Post-operative Plan:   Informed Consent: I have reviewed the patients History and Physical, chart, labs and discussed the procedure including the risks, benefits and alternatives for the proposed anesthesia with the patient or authorized representative who has indicated his/her understanding and acceptance.   Dental advisory given  Plan Discussed with: CRNA, Anesthesiologist and Surgeon  Anesthesia Plan Comments:        Anesthesia Quick Evaluation

## 2015-04-07 ENCOUNTER — Encounter (HOSPITAL_COMMUNITY): Payer: Self-pay | Admitting: Orthopaedic Surgery

## 2015-04-07 DIAGNOSIS — N179 Acute kidney failure, unspecified: Secondary | ICD-10-CM

## 2015-04-07 DIAGNOSIS — F419 Anxiety disorder, unspecified: Secondary | ICD-10-CM

## 2015-04-07 DIAGNOSIS — G8918 Other acute postprocedural pain: Secondary | ICD-10-CM

## 2015-04-07 DIAGNOSIS — F17200 Nicotine dependence, unspecified, uncomplicated: Secondary | ICD-10-CM

## 2015-04-07 DIAGNOSIS — M1990 Unspecified osteoarthritis, unspecified site: Secondary | ICD-10-CM

## 2015-04-07 DIAGNOSIS — R000 Tachycardia, unspecified: Secondary | ICD-10-CM

## 2015-04-07 DIAGNOSIS — I200 Unstable angina: Secondary | ICD-10-CM

## 2015-04-07 DIAGNOSIS — R269 Unspecified abnormalities of gait and mobility: Secondary | ICD-10-CM

## 2015-04-07 DIAGNOSIS — Z9660 Presence of unspecified orthopedic joint implant: Secondary | ICD-10-CM

## 2015-04-07 LAB — BASIC METABOLIC PANEL
ANION GAP: 9 (ref 5–15)
BUN: 12 mg/dL (ref 6–20)
CO2: 23 mmol/L (ref 22–32)
Calcium: 8.7 mg/dL — ABNORMAL LOW (ref 8.9–10.3)
Chloride: 98 mmol/L — ABNORMAL LOW (ref 101–111)
Creatinine, Ser: 1.44 mg/dL — ABNORMAL HIGH (ref 0.61–1.24)
GFR calc Af Amer: >60 mL/min (ref >60)
GFR, EST NON AFRICAN AMERICAN: 57 mL/min — AB (ref >60)
Glucose, Bld: 125 mg/dL — ABNORMAL HIGH (ref 65–99)
POTASSIUM: 3.6 mmol/L (ref 3.5–5.1)
SODIUM: 130 mmol/L — AB (ref 135–145)

## 2015-04-07 LAB — CBC
HCT: 33.2 % — ABNORMAL LOW (ref 39.0–52.0)
Hemoglobin: 11.4 g/dL — ABNORMAL LOW (ref 13.0–17.0)
MCH: 30.8 pg (ref 26.0–34.0)
MCHC: 34.3 g/dL (ref 30.0–36.0)
MCV: 89.7 fL (ref 78.0–100.0)
PLATELETS: 205 10*3/uL (ref 150–400)
RBC: 3.7 MIL/uL — AB (ref 4.22–5.81)
RDW: 13.6 % (ref 11.5–15.5)
WBC: 13.3 10*3/uL — AB (ref 4.0–10.5)

## 2015-04-07 MED ORDER — HYDRALAZINE HCL 20 MG/ML IJ SOLN
10.0000 mg | Freq: Once | INTRAMUSCULAR | Status: AC
  Administered 2015-04-07: 10 mg via INTRAVENOUS
  Filled 2015-04-07: qty 1

## 2015-04-07 NOTE — Care Management (Signed)
Utilization review completed. Tresia Revolorio, RN Case Manager [phone]. 

## 2015-04-07 NOTE — Progress Notes (Signed)
Physical Therapy Treatment Patient Details Name: Spencer Fischer MRN: [id number] DOB: 05/26/1966 Today's Date: 04/07/2015    History of Present Illness Pt presents for bilateral direct anterior hip replacements after failing conservative measures for management of arthritis.    PT Comments    Pt ambulated 15' with RW and mod A. Better knee control this afternoon as well as decreased buttocks pain. PT will continue to follow.   Follow Up Recommendations  CIR     Equipment Recommendations  Rolling walker with 5" wheels;3in1 (PT)    Recommendations for Other Services OT consult     Precautions / Restrictions Precautions Precautions: None Restrictions Weight Bearing Restrictions: Yes RLE Weight Bearing: Weight bearing as tolerated LLE Weight Bearing: Weight bearing as tolerated    Mobility  Bed Mobility Overal bed mobility: Needs Assistance Bed Mobility: Supine to Sit;Sit to Supine     Supine to sit: Min guard Sit to supine: Min assist   General bed mobility comments: pt able to get to EOB with HOB elevated, rail, increased time, and vc's for technique but did not need physical assist. Min A to B LE's for return to supine as well as mon A to reposition  Transfers Overall transfer level: Needs assistance Equipment used: Rolling walker (2 wheeled) Transfers: Sit to/from Stand Sit to Stand: Mod assist         General transfer comment: mod A for power up from sitting as well as vc's for hand placement and technique  Ambulation/Gait Ambulation/Gait assistance: Mod assist Ambulation Distance (Feet): 15 Feet Assistive device: Rolling walker (2 wheeled) Gait Pattern/deviations: Step-through pattern;Wide base of support;Shuffle Gait velocity: very slow Gait velocity interpretation: <1.8 ft/sec, indicative of risk for recurrent falls General Gait Details: pt without knee buckling with ambulation once epidural removed, widened BOS, mod A to steady, pt with increased WB'ing  through UE's on RW.    Stairs            Wheelchair Mobility    Modified Rankin (Stroke Patients Only)       Balance Overall balance assessment: Needs assistance Sitting-balance support: Feet supported Sitting balance-Leahy Scale: Good     Standing balance support: Bilateral upper extremity supported Standing balance-Leahy Scale: Poor                      Cognition Arousal/Alertness: Awake/alert Behavior During Therapy: WFL for tasks assessed/performed Overall Cognitive Status: Within Functional Limits for tasks assessed                      Exercises Total Joint Exercises Ankle Circles/Pumps: AROM;Both;10 reps;Seated Quad Sets: AROM;Both;10 reps;Seated Heel Slides: AROM;Both;10 reps;Supine Hip ABduction/ADduction: AAROM;Both;Supine;10 reps Long Arc Quad: AROM;Both;10 reps;Seated    General Comments General comments (skin integrity, edema, etc.): pt glut pain has decreased since epi removed, tolerating mobility slightly better      Pertinent Vitals/Pain Pain Assessment: 0-10 Pain Score: 9  Pain Location: bilateral hips Pain Intervention(s): Premedicated before session;Monitored during session;Limited activity within patient's tolerance    Home Living                      Prior Function            PT Goals (current goals can now be found in the care plan section) Acute Rehab PT Goals Patient Stated Goal: decreased pain PT Goal Formulation: With patient Time For Goal Achievement: 04/14/15 Potential to Achieve Goals: Good Progress towards PT goals: Progressing  toward goals    Frequency  BID    PT Plan Current plan remains appropriate    Co-evaluation             End of Session Equipment Utilized During Treatment: Gait belt Activity Tolerance: Patient limited by pain Patient left: in bed;with call bell/phone within reach;with family/visitor present     Time: 1423-1450 PT Time Calculation (min) (ACUTE ONLY): 27  min  Charges:  $Gait Training: 8-22 mins $Therapeutic Activity: 8-22 mins                    G Codes:     Lyanne Co, PT  Acute Rehab Services  [phone]  Lyanne Co 04/07/2015, 3:07 PM

## 2015-04-07 NOTE — Evaluation (Signed)
Physical Therapy Evaluation Patient Details Name: Spencer Fischer MRN: [id number] DOB: 02/28/1966 Today's Date: 04/07/2015   History of Present Illness  Pt presents for bilateral direct anterior hip replacements after failing conservative measures for management of arthritis.  Clinical Impression  Pt is s/p bilateral THA resulting in the deficits listed below (see PT Problem List). Pt still had epidural on eval so mobility limited to bed to chair transfer for safety. Pt with cramping in buttocks that is limiting mobility as well.  Pt will benefit from skilled PT to increase their independence and safety with mobility to allow discharge to the venue listed below.      Follow Up Recommendations CIR    Equipment Recommendations  Rolling walker with 5" wheels;3in1 (PT)    Recommendations for Other Services OT consult     Precautions / Restrictions Precautions Precautions: None Restrictions Weight Bearing Restrictions: Yes RLE Weight Bearing: Weight bearing as tolerated LLE Weight Bearing: Weight bearing as tolerated      Mobility  Bed Mobility Overal bed mobility: +2 for physical assistance;Needs Assistance Bed Mobility: Supine to Sit     Supine to sit: +2 for physical assistance;Mod assist     General bed mobility comments: used partial rolling, partial pivoting technique to minimize hip pain, mod A +2 to LE's as well as trunk to get to sitting. Pt able to scoot to EOB once in sitting  Transfers Overall transfer level: Needs assistance Equipment used: 2 person hand held assist Transfers: Sit to/from UGI CorporationStand;Stand Pivot Transfers Sit to Stand: Mod assist;+2 physical assistance Stand pivot transfers: Mod assist;+2 physical assistance       General transfer comment: mobility limited by epidural, arm in arm assist used for better control of pt in case knees buckled. Pt locked out knees, no buckling noted. Had difficulty stepping feet and needed mod support throughout transfer to  chair  Ambulation/Gait             General Gait Details: did not test while pt with epidural  Stairs            Wheelchair Mobility    Modified Rankin (Stroke Patients Only)       Balance Overall balance assessment: Needs assistance Sitting-balance support: Feet supported Sitting balance-Leahy Scale: Good     Standing balance support: Bilateral upper extremity supported Standing balance-Leahy Scale: Poor                               Pertinent Vitals/Pain Pain Assessment: Faces Faces Pain Scale: Hurts worst Pain Location: buttocks Pain Descriptors / Indicators: Cramping Pain Intervention(s): Limited activity within patient's tolerance;Monitored during session;Repositioned         H# 107 bpm, O2 sats 97% on RA  Home Living Family/patient expects to be discharged to:: Unsure Living Arrangements: Parent               Additional Comments: pt hoping to go to rehab before going home    Prior Function Level of Independence: Independent         Comments: pt has been unable to work due to hip pain, 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 3/5 knee extension and 2/5 hip flex witnessed with mobility LLE Deficits / Details: same as RLE  Cervical / Trunk Assessment: Normal  Communication   Communication: No difficulties  Cognition Arousal/Alertness: Awake/alert Behavior During Therapy: Anxious;WFL for tasks assessed/performed Overall Cognitive Status: Within Functional Limits for tasks assessed                      General Comments General comments (skin integrity, edema, etc.): pt's  biggest complaint currently is cramping in buttocks, pt cannot get comfortable, nor can he sleep.     Exercises Total Joint Exercises Ankle Circles/Pumps: AROM;Both;10 reps;Seated Quad Sets: AROM;Both;10 reps;Seated Heel Slides: AROM;Both;10 reps;Seated      Assessment/Plan    PT Assessment Patient needs continued PT services  PT Diagnosis Difficulty walking;Acute pain   PT Problem List Decreased strength;Decreased range of motion;Decreased balance;Decreased mobility;Decreased coordination;Impaired sensation;Decreased knowledge of use of DME;Decreased knowledge of precautions;Pain  PT Treatment Interventions DME instruction;Gait training;Stair training;Functional mobility training;Therapeutic activities;Therapeutic exercise;Balance training;Neuromuscular re-education;Patient/family education   PT Goals (Current goals can be found in the Care Plan section) Acute Rehab PT Goals Patient Stated Goal: decreased pain PT Goal Formulation: With patient Time For Goal Achievement: 04/14/15 Potential to Achieve Goals: Good    Frequency BID   Barriers to discharge        Co-evaluation               End of Session Equipment Utilized During Treatment: Gait belt Activity Tolerance: Patient limited by pain Patient left: in chair;with call bell/phone within reach;with family/visitor present Nurse Communication: Mobility status         Time: 0804-0828 PT Time Calculation (min) (ACUTE ONLY): 24 min   Charges:   PT Evaluation $Initial PT Evaluation Tier I: 1 Procedure PT Treatments $Therapeutic Activity: 8-22 mins   PT G Codes:       Lyanne Co, PT  Acute Rehab Services  [phone]  Lyanne Co 04/07/2015, 10:54 AM

## 2015-04-07 NOTE — Care Management Note (Signed)
Case Management Note  Patient Details  Name: Spencer Fischer MRN: [id number] Date of Birth: 05/04/1966  Subjective/Objective:   49 yr old male s/p Bilateral hips arthroplasty, anterior approach.   Action/Plan: Case manager spoke with patient concerning discharge plan. Patient has been referred for CIR. Case manager will continue to follow.   Expected Discharge Date:                  Expected Discharge Plan:     In-House Referral:     Discharge planning Services  CM Consult  Post Acute Care Choice:    Choice offered to:     DME Arranged:    DME Agency:     HH Arranged:    HH Agency:     Status of Service:  In process, will continue to follow  Medicare Important Message Given:    Date Medicare IM Given:    Medicare IM give by:    Date Additional Medicare IM Given:    Additional Medicare Important Message give by:     If discussed at Long Length of Stay Meetings, dates discussed:    Additional Comments:  Spencer Fischer, Spencer Moncrief Naomi, RN 04/07/2015, 12:28 PM

## 2015-04-07 NOTE — Progress Notes (Signed)
Subjective: 1 Day Post-Op Procedure(s) (LRB): BILATERAL ANTERIOR TOTAL HIP ARTHROPLASTY (Bilateral) Patient reports pain as moderate.  Labs not back yet.  Epidural to be pulled this am by anesthesia.  Objective: Vital signs in last 24 hours: Temp:  [97.4 F (36.3 C)-99.7 F (37.6 C)] 99.7 F (37.6 C) (10/26 0036) Pulse Rate:  [74-112] 105 (10/26 0036) Resp:  [12-25] 18 (10/26 0036) BP: (90-152)/(65-101) 131/80 mmHg (10/26 0036) SpO2:  [97 %-100 %] 100 % (10/26 0036) Weight:  [104.781 kg (231 lb)] 104.781 kg (231 lb) (10/25 1049)  Intake/Output from previous day: 10/25 0701 - 10/26 0700 In: 4840 [P.O.:240; I.V.:4100; IV Piggyback:500] Out: 3015 [Urine:2515; Blood:500] Intake/Output this shift: Total I/O In: 1140 [P.O.:240; I.V.:900] Out: 2125 [Urine:2125]  No results for input(s): HGB in the last 72 hours. No results for input(s): WBC, RBC, HCT, PLT in the last 72 hours. No results for input(s): NA, K, CL, CO2, BUN, CREATININE, GLUCOSE, CALCIUM in the last 72 hours. No results for input(s): LABPT, INR in the last 72 hours.  Intact pulses distally Dorsiflexion/Plantar flexion intact Incision: scant drainage  Assessment/Plan: 1 Day Post-Op Procedure(s) (LRB): BILATERAL ANTERIOR TOTAL HIP ARTHROPLASTY (Bilateral) Up with therapy - WBAT bilateral hips; no hip precautions.  Alcee Sipos Y 04/07/2015, 6:59 AM

## 2015-04-07 NOTE — Consult Note (Signed)
Physical Medicine and Rehabilitation Consult   Reason for Consult: B-THR Referring Physician: Dr. Magnus IvanBlackman.    HPI: Spencer Fischer is a 49 y.o. male with history HTN, anxiety, progressive hip pain due to OA for the past 5 years and failure of conservative therapy. His pain was 10/10 in severity and he had associated difficulties with ambulation.  He failed conservative treatment and elected to undergo B-THR on 04/06/15 by Dr. Magnus IvanBlackman. Post-op course complicated by pain, tachycardia, AKI, ABLA, PT evaluation done today and limited to transfers due to epidural as well as muscle spasms. CIR recommended by MD and PT for follow up therapy.    Review of Systems  HENT: Negative for hearing loss.   Eyes: Negative for blurred vision and double vision.  Respiratory: Negative for cough, shortness of breath and wheezing.   Cardiovascular: Negative for chest pain and palpitations.  Gastrointestinal: Negative for heartburn, nausea and abdominal pain.       Lack of appetite today  Genitourinary: Negative for urgency and frequency.  Musculoskeletal: Positive for joint pain. Negative for myalgias.  Skin: Negative for itching.  Neurological: Negative for dizziness, speech change, focal weakness and headaches.       B/l CTS  All other systems reviewed and are negative.     Past Medical History  Diagnosis Date  . Hypertension   . Carpal tunnel syndrome   . Arthritis   . Anxiety     Past Surgical History  Procedure Laterality Date  . Knee surgery Left     History reviewed. No pertinent family history.    Social History:  Currently living with mother. Reports " Homeless---have to sleep on the couch" but can return to mother's home after discharge. Disabled due to hip pain and was sedentary. Used walker or chair for mobility PTA. He reports that he has been smoking Cigarettes.  He has been smoking about 0.25 packs per day. He does not have any smokeless tobacco history on file. He reports  that he drinks about 12 pack per week. He reports that he does not use illicit drugs.    Allergies: No Known Allergies    Medications Prior to Admission  Medication Sig Dispense Refill  . HYDROcodone-acetaminophen (NORCO/VICODIN) 5-325 MG per tablet Take 2 tablets by mouth every 4 (four) hours as needed. 10 tablet 0  . triamterene-hydrochlorothiazide (MAXZIDE-25) 37.5-25 MG tablet Take 1 tablet by mouth daily.  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 Pt able to scoot to EOB once in sitting Transfers Overall transfer level: Needs assistance Equipment used: 2 person hand held assist Transfers: Sit to/from Stand, Stand Pivot Transfers Sit to Stand: Mod assist, +2 physical assistance Stand pivot transfers: Mod assist, +2 physical assistance General transfer comment: mobility limited by epidural, arm in arm assist used for better control of pt in  case knees buckled. Pt locked out knees, no buckling noted. Had difficulty stepping feet and needed mod support throughout transfer to chair Ambulation/Gait General Gait Details: did not test while pt with epidural    ADL:    Cognition: Cognition Overall Cognitive Status: Within Functional Limits for tasks assessed Orientation Level: Oriented  X4 Cognition Arousal/Alertness: Awake/alert Behavior During Therapy: Anxious, WFL for tasks assessed/performed Overall Cognitive Status: Within Functional Limits for tasks assessed   Blood pressure 129/66, pulse 104, temperature 99.2 F (37.3 C), temperature source Oral, resp. rate 18, height  (1.803 m), weight 104.781 kg (231 lb), SpO2 96 %. Physical Exam  Nursing note and vitals reviewed. Constitutional: He is oriented to person, place, and time. He appears well-developed and well-nourished.  Restless and distracted due to pain  HENT:  Head: Normocephalic and atraumatic.  Eyes: Conjunctivae and EOM are normal. Pupils are equal, round, and reactive to light.  Neck: Normal range of motion. Neck supple.  Cardiovascular: Normal rate and regular rhythm.   Respiratory: Effort normal and breath sounds normal. No respiratory distress. He has no wheezes.  GI: Soft. Bowel sounds are normal. He exhibits no distension. There is no tenderness.  Musculoskeletal: He exhibits edema and tenderness.  Strength b/l UE 5/5 grossly B/l LE hip flex 3-/5 grossly (limited by pain), 5/5 ankle dorsi/plantar flexion  Neurological: He is alert and oriented to person, place, and time. No cranial nerve deficit.  Skin: Skin is warm and dry. No rash noted.  Psychiatric: He has a normal mood and affect. His speech is normal and behavior is normal. Cognition and memory are normal.    Results for orders placed or performed during the hospital encounter of 04/06/15 (from the past 24 hour(s))  CBC     Status: Abnormal   Collection Time: 04/07/15  6:55 AM  Result Value Ref Range   WBC 13.3 (H) 4.0 - 10.5 K/uL   RBC 3.70 (L) 4.22 - 5.81 MIL/uL   Hemoglobin 11.4 (L) 13.0 - 17.0 g/dL   HCT 40.9 (L) 81.1 - 91.4 %   MCV 89.7 78.0 - 100.0 fL   MCH 30.8 26.0 - 34.0 pg   MCHC 34.3 30.0 - 36.0 g/dL   RDW 78.2 95.6 - 21.3 %   Platelets 205 150 - 400 K/uL  Basic metabolic panel     Status: Abnormal   Collection  Time: 04/07/15  6:55 AM  Result Value Ref Range   Sodium 130 (L) 135 - 145 mmol/L   Potassium 3.6 3.5 - 5.1 mmol/L   Chloride 98 (L) 101 - 111 mmol/L   CO2 23 22 - 32 mmol/L   Glucose, Bld 125 (H) 65 - 99 mg/dL   BUN 12 6 - 20 mg/dL   Creatinine, Ser 0.86 (H) 0.61 - 1.24 mg/dL   Calcium 8.7 (L) 8.9 - 10.3 mg/dL   GFR calc non Af Amer 57 (L) >60 mL/min   GFR calc Af Amer >60 >60 mL/min   Anion gap 9 5 - 15   Dg Pelvis Portable  04/06/2015  CLINICAL DATA:  Status post bilateral hip arthroplasty EXAM: PORTABLE PELVIS 1-2 VIEWS COMPARISON:  None. FINDINGS: Bilateral hip replacements are noted. Pelvic ring appears intact. No acute bony abnormality is seen. IMPRESSION: Status post bilateral hip arthroplasty Electronically Signed   By: Alcide Clever M.D.   On: 04/06/2015 16:34   Dg Hip Operative Unilat With Pelvis Left  04/06/2015  CLINICAL DATA:  Patient status post left hip arthroplasty. EXAM:  OPERATIVE LEFT HIP (WITH PELVIS IF PERFORMED) 2 VIEWS TECHNIQUE: Fluoroscopic spot image(s) were submitted for interpretation post-operatively. COMPARISON:  Hip radiographs 01/28/2015 FINDINGS: 2 intraoperative fluoroscopic images were submitted. Patient status post left hip arthroplasty. No definite evidence for acute abnormality. IMPRESSION: Patient status post left hip arthroplasty. Electronically Signed   By: Annia Belt M.D.   On: 04/06/2015 16:02   Dg Hip Operative Unilat With Pelvis Right  04/06/2015  CLINICAL DATA:  Patient status post right hip arthroplasty. EXAM: OPERATIVE RIGHT HIP (WITH PELVIS IF PERFORMED) 2 VIEWS TECHNIQUE: Fluoroscopic spot image(s) were submitted for interpretation post-operatively. COMPARISON:  Pelvic radiograph 09/16/2014 FINDINGS: Patient status post right hip arthroplasty. No evidence for acute abnormality. IMPRESSION: Patient status post right hip arthroplasty.  No acute process. Electronically Signed   By: Annia Belt M.D.   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  Records reviewed and summated above. Oral pharmacological pain control  Bowel program: consider colace, miralax and/or Senna. PRN suppository Fall precautions Monitor surgical wound and skin especially over pressure sensitive areas Prevent immobility complications: pressure ulcers, contractures, HO Consider modalities, such as TENs PT/OT consults for mobility strengthening, endurance training and adaptive ADLs   1. Does the need for close, 24 hr/day medical supervision in concert with the patient's rehab needs make it unreasonable for this patient to be served in a less intensive setting? Yes 2. Co-Morbidities requiring supervision/potential complications: Tachycardia (monitor in conjunction with therapies and address pain if necessary), pain (cont to monitor in accordance with therapies), hyponatremia, AKI (avoid nephrotoxic meds), OA (ensure pain does not limit progress in therapies), anxiety (treat if necessary, so that it does not interfere with functional progress), ABLA (transfuse if appropriate to maximize energy for therapies), b/l CTS (avoid further pain and injury with adaptive techniques), HTN (monitor and adjust meds as activity levels increase), tobacco abuse (consider counseling to promote wound healing, prosthesis longevity, and overall health) 3. Due to safety, skin/wound care, disease management, medication administration, pain management and patient education, does the patient require 24 hr/day rehab nursing? Yes 4. Does the patient require coordinated care of a physician, rehab nurse, PT (1.5-2 hrs/day, 5 days/week) and OT (1.5-2 hrs/day, 5 days/week) to address physical and functional deficits in the context of the above medical diagnosis(es)? Yes Addressing deficits in the following areas: balance, endurance, locomotion, strength, transferring, bathing, dressing, toileting  and psychosocial support 5. Can the patient actively participate in an intensive therapy program of at least 3 hrs of therapy per day at least 5 days per week? Potentially 6. The potential for patient to make measurable gains while on inpatient rehab is excellent and good 7. Anticipated functional outcomes upon discharge from inpatient rehab are modified independent and supervision  with PT, modified independent and supervision with OT, n/a with SLP. 8. Estimated rehab length of stay to reach the above functional goals is: 14-16 days. 9. Does the patient have adequate social supports and living environment to accommodate these discharge functional goals? Potentially 10. Anticipated D/C setting: Home 11. Anticipated post D/C treatments: HH therapy and Home excercise program 12. Overall Rehab/Functional Prognosis: excellent  RECOMMENDATIONS: This patient's condition is appropriate for continued rehabilitative care in the following setting: CIR Patient has agreed to participate in recommended program. Yes Note that insurance prior authorization may be required for reimbursement for recommended care.  Comment: Rehab Admissions Coordinator to follow up.  Maryla Morrow, MD 04/07/2015

## 2015-04-07 NOTE — Progress Notes (Signed)
Pt complained of severe pain and spasm not being relieved by epidural. Onalee Huaavid Crew,MD called and orders given to increase epidural naropin rate from 885ml/hr to 5110ml/hr. Rate adjusted to 4010ml/hr. Patient pain reassessed and he stated his pain is much better. He does still complain of discomfort and "unable to get comfortable" but this appears to be more position and stiffness/spasm related. Patient repositioned in bed several times during the night which appears to give him some relief. Will continue to monitor patient.

## 2015-04-07 NOTE — Op Note (Signed)
Spencer Fischer, Spencer Fischer NO.:  [id number]  MEDICAL RECORD NO.:  [id number]  LOCATION:  5N02C                        FACILITY:  [facility]  PHYSICIAN:  Vanita Panda. Magnus Ivan, M.D.DATE OF BIRTH:  1965/06/28  DATE OF PROCEDURE:  04/06/2015 DATE OF DISCHARGE:                              OPERATIVE REPORT   PREOPERATIVE DIAGNOSES:  Bilateral hip severe osteoarthritis and degenerative joint disease.  POSTOPERATIVE DIAGNOSES:  Bilateral hip severe osteoarthritis and degenerative joint disease.  PROCEDURE:  Bilateral total hip arthroplasty through direct anterior approach.  IMPLANTS: 1. Left side, size 52 DePuy Gription acetabular component with a     single screw, size 36+ 0 neutral polyethylene liner, size 11 Corail     femoral component with standard offset, size 36+ 5 ceramic hip     ball. 2. Right side, size 52 DePuy Sector Gription acetabular component with     a single screw, size 36+ 0 neutral polyethylene liner, size 11     Corail femoral component with standard offset, size 36+ 1.5 ceramic     hip ball.  SURGEON:  Vanita Panda. Magnus Ivan, M.D.  ASSISTANT:  Richardean Canal, PA-C.  ANESTHESIA:  Continuous spinal epidural.  ANTIBIOTICS: 1. 2 g of IV Ancef. 2. 1 g of IV vancomycin.  BLOOD LOSS:  500 mL to 600 mL total.  COMPLICATIONS:  None.  INDICATIONS:  Spencer Fischer is a 49 year old with debilitating arthritis involving both of his hips.  He has radiographic evidence of complete joint space narrowing, periarticular osteophytes, severe sclerotic changes and cystic changes on both sides.  Both sides are quite debilitating to him in terms of pain.  He has tried and failed all forms of conservative treatment.  This is now disability for him that he is unemployed and he is hoping to have his hips replaced so he can obtain gainful employment again at some point and become a productive member of society.  He understands with bilateral hips, he has increased  chance of acute blood loss anemia, nerve and vessel injury, fracture, infection, dislocation, DVT.  He understands our goal to decrease pain, improve mobility and overall improved quality of life.  PROCEDURE DESCRIPTION:  After informed consent was obtained, appropriate left and right hips were marked.  Anesthesia obtained a continuous spinal epidural.  He was then brought to the operating room, laid supine on the stretcher.  A Foley catheter was placed and both feet had traction boots applied to them.  Next, he was placed supine on the Hana fracture table with a perineal post in place and both legs in inline skeletal traction devices with no traction applied.  We started with the left hip first.  This left hip was prepped and draped with DuraPrep and sterile drapes.  Time-out was called and he was identified as correct patient and correct left hip.  We then made incision inferior and posterior to the anterior superior iliac spine and carried this obliquely down the leg.  We dissected down the tensor fascia lata muscle and tensor fascia was then divided longitudinally, so we could proceed with a direct anterior approach to the hip.  We identified and cauterized the lateral femoral circumflex  vessels and then identified the hip capsule, opened up the hip capsule in L-type format finding a large joint effusion.  We then used an oscillating saw to make our femoral neck cut proximal to the lesser trochanter and completed this with an osteotome.  We placed a corkscrew guide in the femoral head and removed the femoral head in its entirety.  We then cleaned the acetabular remnants to the acetabular labrum and debris.  We placed the bent Hohmann to the medial acetabular rim and then began reaming from a size 42 reamer in 2 mm increments up to a size 52 with all reamers under direct visualization and the last reamer under direct fluoroscopy so we could obtain our depth of reaming, our inclination  and anteversion.  We then placed the real DePuy Sector Gription acetabular component size 52, single screw and then a 36+ 0 neutral polyethylene liner.  Attention was then turned to the femur.  With the leg externally rotated to 100 degrees, extended and adducted, we were able to use a Mueller retractor medially and a Hohmann retractor behind the greater trochanter.  We were able to use a box cutting osteotome to enter the femoral canal and a rongeur to lateralize after releasing the lateral joint capsule.  We then began broaching from a size 8 broach up to a size 11.  With the size 11, we trialed a standard neck and a 36+ 1.5 hip ball.  We brought the leg back over and up with traction and internal rotation reducing the pelvis and we were pleased with stability, but we felt like we could increase his leg length a little.  We dislocated the hip and then removed the trial components and placed the real size 11 Corail femoral component and the real 36+ 5 ceramic hip ball and reduced this in the pelvis and we were pleased with range of motion, stability, offset and leg length.  We then irrigated the soft tissues with normal saline solution.  There was no joint capsule that closed, but we were not worried about stability.  We closed the iliotibial band with interrupted #1 Vicryl suture followed by 0 Vicryl in the deep tissue, 2-0 Vicryl in the subcutaneous tissue, and interrupted staples on the skin.  Xeroform and an Aquacel dressing were applied.  We then broke down the drapes and kept the back table sterile.  We prepped and draped his opposite side and performed the exact same procedure.  A time-out was called to identify the correct patient and correct right leg.  Anesthesia said it was safest to proceed with the case on this side due to the blood loss being between 250 and 300 mL on the left side.  We then made an incision inferior and posterior to the anterior suprailiac spine on the  right side and carried this down to the tensor fascia lata muscle, then went through the same exact dissection and preparation on the right side.  We were able to ream.  Once we exposed the joint capsule, we removed the femoral head to a size 52 and placed a 52 acetabular component with a single screw and a 36+ 0 neutral vitamin liner.  We went to the femur, we were able to broach up to a size 11 again and we made our femoral neck cut a little bit higher, so we trialed a 36+ 1.5 hip ball and reduced the acetabular and we were pleased with leg length and offset. We then dislocated the hip and  removed the trial components and placed the real Corail femoral component with standard offset and the real 36+ 1.5 ceramic hip ball and reduced this in the pelvis.  Again, we were pleased with leg lengths offset, range of motion and stability.  We then irrigated the soft tissue on this side and closed the tensor fascia with interrupted #1 Vicryl suture followed by 0 Vicryl in the deep tissue, 2- 0 Vicryl in the subcutaneous tissue, interrupted staples on the skin, Xeroform and Aquacel dressing.  He was then taken off the Hana table and to the recovery room in stable condition.  All final counts were correct.  There were no complications noted.     Vanita Panda. Magnus Ivan, M.D.    CYB/MEDQ  D:  04/06/2015  T:  04/07/2015  Job:  [id number]

## 2015-04-07 NOTE — Progress Notes (Signed)
Called Dr. Magnus IvanBlackman to report increased BP.  He will see the pt later today.

## 2015-04-07 NOTE — Progress Notes (Signed)
Rehab Admissions Coordinator Note:  Patient was screened by Trish MageLogue, Lynsi Dooner M for appropriateness for an Inpatient Acute Rehab Consult.  At this time, we are recommending Inpatient Rehab consult.  Trish MageLogue, Toni Hoffmeister M 04/07/2015, 12:59 PM  I can be reached at [phone].

## 2015-04-08 ENCOUNTER — Encounter (HOSPITAL_COMMUNITY): Payer: Self-pay | Admitting: General Practice

## 2015-04-08 LAB — CBC
HEMATOCRIT: 32.8 % — AB (ref 39.0–52.0)
Hemoglobin: 11.5 g/dL — ABNORMAL LOW (ref 13.0–17.0)
MCH: 31.3 pg (ref 26.0–34.0)
MCHC: 35.1 g/dL (ref 30.0–36.0)
MCV: 89.1 fL (ref 78.0–100.0)
PLATELETS: 201 10*3/uL (ref 150–400)
RBC: 3.68 MIL/uL — AB (ref 4.22–5.81)
RDW: 13.7 % (ref 11.5–15.5)
WBC: 19.8 10*3/uL — ABNORMAL HIGH (ref 4.0–10.5)

## 2015-04-08 NOTE — Progress Notes (Signed)
Rehab admissions - Evaluated for possible admission.  I met with patient and his sister.  Patient would like inpatient rehab prior to home with his mother.  I am hopeful that we can get patient into inpatient rehab tomorrow if he is medically ready.  Call me for questions.  #[phone]

## 2015-04-08 NOTE — Plan of Care (Signed)
Problem: Consults Goal: Diagnosis- Total Joint Replacement Primary Total Hip     

## 2015-04-08 NOTE — Progress Notes (Signed)
Subjective: 2 Days Post-Op Procedure(s) (LRB): BILATERAL ANTERIOR TOTAL HIP ARTHROPLASTY (Bilateral) Patient reports pain as moderate.  Working with therapy.  Objective: Vital signs in last 24 hours: Temp:  [97.9 F (36.6 C)-99 F (37.2 C)] 97.9 F (36.6 C) (10/27 0512) Pulse Rate:  [93-112] 107 (10/27 0512) Resp:  [16-18] 17 (10/27 0512) BP: (152-166)/(92-102) 152/95 mmHg (10/27 0512) SpO2:  [96 %-99 %] 98 % (10/27 0512)  Intake/Output from previous day: 10/26 0701 - 10/27 0700 In: 600 [P.O.:600] Out: 850 [Urine:850] Intake/Output this shift:     Recent Labs  04/07/15 0655 04/08/15 0817  HGB 11.4* 11.5*    Recent Labs  04/07/15 0655 04/08/15 0817  WBC 13.3* 19.8*  RBC 3.70* 3.68*  HCT 33.2* 32.8*  PLT 205 201    Recent Labs  04/07/15 0655  NA 130*  K 3.6  CL 98*  CO2 23  BUN 12  CREATININE 1.44*  GLUCOSE 125*  CALCIUM 8.7*   No results for input(s): LABPT, INR in the last 72 hours.  Sensation intact distally Intact pulses distally Dorsiflexion/Plantar flexion intact Incision: scant drainage  Assessment/Plan: 2 Days Post-Op Procedure(s) (LRB): BILATERAL ANTERIOR TOTAL HIP ARTHROPLASTY (Bilateral) Up with therapy Discharge home with home health next 2-3 days pending progress with therapy and pian control  Tamora Huneke Y 04/08/2015, 9:49 AM

## 2015-04-08 NOTE — Progress Notes (Signed)
Physical Therapy Treatment Patient Details Name: Spencer GoodKevin L Guadiana MRN: [id number] DOB: 08/04/1965 Today's Date: 04/08/2015    History of Present Illness Pt presents for bilateral direct anterior hip replacements after failing conservative measures for management of arthritis.    PT Comments    Patient able to increase ambulation to 40 feet with rw and min guard assistance. Will continue to follow and progress mobility and strengthening as tolerated. Family present throughout session.   Follow Up Recommendations  CIR     Equipment Recommendations  Rolling walker with 5" wheels;3in1 (PT)    Recommendations for Other Services       Precautions / Restrictions Precautions Precautions: Fall Restrictions Weight Bearing Restrictions: Yes RLE Weight Bearing: Weight bearing as tolerated LLE Weight Bearing: Weight bearing as tolerated    Mobility  Bed Mobility               General bed mobility comments: up in chair upon arrival  Transfers Overall transfer level: Needs assistance Equipment used: Rolling walker (2 wheeled) Transfers: Sit to/from Stand Sit to Stand: Min assist         General transfer comment: from chair.   Ambulation/Gait Ambulation/Gait assistance: Min guard Ambulation Distance (Feet): 40 Feet Assistive device: Rolling walker (2 wheeled) Gait Pattern/deviations: Step-through pattern;Decreased step length - right;Decreased step length - left     General Gait Details: slow pattern but no loss of balance. Patient reports feeling better up and walking   Stairs            Wheelchair Mobility    Modified Rankin (Stroke Patients Only)       Balance Overall balance assessment: Needs assistance Sitting-balance support: No upper extremity supported Sitting balance-Leahy Scale: Fischer     Standing balance support: Bilateral upper extremity supported Standing balance-Leahy Scale: Poor Standing balance comment: using rw                     Cognition Arousal/Alertness: Awake/alert Behavior During Therapy: WFL for tasks assessed/performed Overall Cognitive Status: Within Functional Limits for tasks assessed                      Exercises      General Comments General comments (skin integrity, edema, etc.): reviewed continued performance of ankle pumps, quad sets and glute sets.       Pertinent Vitals/Pain Pain Assessment: 0-10 Pain Score: 8  Pain Location: both legs Pain Descriptors / Indicators: Cramping;Spasm Pain Intervention(s): Limited activity within patient's tolerance;Monitored during session    Home Living                      Prior Function            PT Goals (current goals can now be found in the care plan section) Acute Rehab PT Goals Patient Stated Goal: less pain and be able to move better.  PT Goal Formulation: With patient Time For Goal Achievement: 04/14/15 Potential to Achieve Goals: Fischer Progress towards PT goals: Progressing toward goals    Frequency  7X/week    PT Plan Current plan remains appropriate;Frequency needs to be updated    Co-evaluation             End of Session Equipment Utilized During Treatment: Gait belt Activity Tolerance: No increased pain;Patient limited by fatigue Patient left: in chair;with call bell/phone within reach;with family/visitor present     Time: [id number] PT Time Calculation (min) (ACUTE ONLY): 21 min  Charges:  $Gait Training: 8-22 mins                    G Codes:      Christiane Ha, PT, CSCS Pager [phone] Office [phone]  04/08/2015, 2:19 PM

## 2015-04-08 NOTE — Progress Notes (Signed)
Occupational Therapy Evaluation Patient Details Name: Spencer Fischer MRN: [id number] DOB: 03/16/1966 Today's Date: 04/08/2015    History of Present Illness Pt presents for bilateral direct anterior hip replacements after failing conservative measures for management of arthritis.   Clinical Impression   PTA, pt independent with ADL and mobility. Pt currently requires Min A with mobility and mod to max A with LB ADL, will benefit from CIR to reach mod I level. Will follow acutely.    Follow Up Recommendations  CIR;Supervision/Assistance - 24 hour    Equipment Recommendations  3 in 1 bedside comode;Tub/shower bench    Recommendations for Other Services Rehab consult     Precautions / Restrictions Precautions Precautions: Fall Restrictions Weight Bearing Restrictions: Yes RLE Weight Bearing: Weight bearing as tolerated LLE Weight Bearing: Weight bearing as tolerated      Mobility Bed Mobility   General bed mobility comments: pt OOB in chair  Transfers Overall transfer level: Needs assistance Equipment used: Rolling walker (2 wheeled) Transfers: Sit to/from Stand Sit to Stand: Min assist Stand pivot transfers: From elevated surface       General transfer comment: from recliner    Balance Overall balance assessment: Needs assistance Sitting-balance support: No upper extremity supported Sitting balance-Leahy Scale: Good     Standing balance support: Bilateral upper extremity supported Standing balance-Leahy Scale: Poor Standing balance comment: heavy reliance on RW. [place] to release to complete pericare                            ADL Overall ADL's : Needs assistance/impaired     Grooming: Set up   Upper Body Bathing: Set up;Sitting   Lower Body Bathing: Sit to/from stand;Maximal assistance   Upper Body Dressing : Set up;Sitting   Lower Body Dressing: Maximal assistance;Sit to/from stand   Toilet Transfer: Minimal assistance;RW;Ambulation    Toileting- Clothing Manipulation and Hygiene: Minimal assistance;Sit to/from stand       Functional mobility during ADLs: Minimal assistance;Rolling walker;Cueing for safety;Cueing for sequencing General ADL Comments: Pt will benefit from AE for LB ADL. Able to ambulate @ 15 ft before requiring a break. Began education on availability of AE. Pt states he is homeless but that the friend that he will live with after D/C has a tub/shower and bathroom is RW accessible.      Vision     Perception     Praxis      Pertinent Vitals/Pain Pain Assessment: 0-10 Pain Score: 7  Pain Location: B hips Pain Descriptors / Indicators: Aching;Burning Pain Intervention(s): Limited activity within patient's tolerance;Monitored during session     Hand Dominance Right   Extremity/Trunk Assessment Upper Extremity Assessment Upper Extremity Assessment: Overall WFL for tasks assessed   Lower Extremity Assessment Lower Extremity Assessment: Defer to PT evaluation   Cervical / Trunk Assessment Cervical / Trunk Assessment: Normal   Communication Communication Communication: No difficulties   Cognition Arousal/Alertness: Awake/alert Behavior During Therapy: WFL for tasks assessed/performed Overall Cognitive Status: Within Functional Limits for tasks assessed                     General Comments       Exercises      Shoulder Instructions      Home Living Family/patient expects to be discharged to:: Unsure  Additional Comments: Pt states he is homeless adn stays with friends/families      Prior Functioning/Environment Level of Independence: Independent        Comments: pt has been unable to work due to hip pain, 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    OT Diagnosis: Generalized weakness;Acute pain   OT Problem List: Decreased strength;Decreased range of motion;Decreased  activity tolerance;Impaired balance (sitting and/or standing);Decreased safety awareness;Decreased knowledge of use of DME or AE;Pain   OT Treatment/Interventions: Self-care/ADL training;DME and/or AE instruction;Therapeutic activities;Patient/family education;Balance training    OT Goals(Current goals can be found in the care plan section) Acute Rehab OT Goals Patient Stated Goal: to be independent and work again OT Goal Formulation: With patient Time For Goal Achievement: 04/22/15 Potential to Achieve Goals: Good ADL Goals Pt Will Perform Lower Body Bathing: with modified independence;with adaptive equipment;sit to/from stand Pt Will Perform Lower Body Dressing: with adaptive equipment;sit to/from stand;with modified independence Pt Will Transfer to Toilet: with modified independence;bedside commode;ambulating Pt Will Perform Toileting - Clothing Manipulation and hygiene: with modified independence;sit to/from stand  OT Frequency: Min 3X/week   Barriers to D/C:            Co-evaluation              End of Session Equipment Utilized During Treatment: Gait belt;Rolling walker Nurse Communication: Mobility status  Activity Tolerance: Patient tolerated treatment well Patient left: in chair;with family/visitor present;with call bell/phone within reach   Time: 1611-1630 OT Time Calculation (min): 19 min Charges:  OT General Charges $OT Visit: 1 Procedure OT Evaluation $Initial OT Evaluation Tier I: 1 Procedure G-Codes:    Lelynd Poer,HILLARY 05-08-2015, 4:42 PM   [facility], OTR/L  [phone] 05/08/2015

## 2015-04-08 NOTE — Progress Notes (Signed)
Physical Therapy Treatment Patient Details Name: Spencer Fischer MRN: [id number] DOB: 01/19/1966 Today's Date: 04/08/2015    History of Present Illness Pt presents for bilateral direct anterior hip replacements after failing conservative measures for management of arthritis.    PT Comments    Patient making gradual progress but still requiring moderate assistance with bed mobility and min assist with sit to stand from elevated surfaces. Ambulation improving but very slow pattern and heavy reliance on rw needed. Will continue with PT services to progress strength and general mobility.   Follow Up Recommendations  CIR     Equipment Recommendations  Rolling walker with 5" wheels;3in1 (PT)    Recommendations for Other Services       Precautions / Restrictions Precautions Precautions: Fall Restrictions Weight Bearing Restrictions: Yes RLE Weight Bearing: Weight bearing as tolerated LLE Weight Bearing: Weight bearing as tolerated    Mobility  Bed Mobility Overal bed mobility: Needs Assistance Bed Mobility: Supine to Sit     Supine to sit: Mod assist;HOB elevated     General bed mobility comments: mod assist to move hips laterally to attempt sitting, mod assist moving LLE toward edge of bed, min assist at trunk. HOB elevated and needing use of rails.   Transfers Overall transfer level: Needs assistance Equipment used: Rolling walker (2 wheeled) Transfers: Sit to/from Stand Sit to Stand: Min assist Stand pivot transfers: From elevated surface       General transfer comment: from elevated bed, assist with trunk and stability with initial standing.   Ambulation/Gait Ambulation/Gait assistance: Min guard Ambulation Distance (Feet): 55 Feet Assistive device: Rolling walker (2 wheeled) Gait Pattern/deviations: Decreased step length - right;Decreased step length - left Gait velocity: very slow pattern   General Gait Details: slow pattern, noted instability but no gross loss  of balance. Patient unable to take full strides with either LE.    Stairs            Wheelchair Mobility    Modified Rankin (Stroke Patients Only)       Balance Overall balance assessment: Needs assistance Sitting-balance support: No upper extremity supported Sitting balance-Leahy Scale: Good     Standing balance support: Bilateral upper extremity supported Standing balance-Leahy Scale: Poor Standing balance comment: using rw                    Cognition Arousal/Alertness: Awake/alert Behavior During Therapy: WFL for tasks assessed/performed Overall Cognitive Status: Within Functional Limits for tasks assessed                      Exercises Total Joint Exercises Ankle Circles/Pumps: AROM;Both;15 reps Quad Sets: Strengthening;Both;10 reps Gluteal Sets: Strengthening;Both;10 reps Short Arc Quad: Strengthening;Both;10 reps Heel Slides: AAROM;Both;10 reps Hip ABduction/ADduction: Strengthening;Both;10 reps;Other (comment) (max assist)    General Comments       Pertinent Vitals/Pain Pain Assessment: 0-10 Pain Score: 8  Pain Location: thighs adn hips Pain Descriptors / Indicators: Spasm Pain Intervention(s): Limited activity within patient's tolerance;Monitored during session    Home Living                      Prior Function            PT Goals (current goals can now be found in the care plan section) Acute Rehab PT Goals Patient Stated Goal: less pain and be able to move better.  PT Goal Formulation: With patient Time For Goal Achievement: 04/14/15 Potential to Achieve Goals:  Good Progress towards PT goals: Progressing toward goals    Frequency  7X/week    PT Plan Current plan remains appropriate;Frequency needs to be updated    Co-evaluation             End of Session Equipment Utilized During Treatment: Gait belt Activity Tolerance: Patient limited by pain;Patient limited by fatigue Patient left: in chair;with  call bell/phone within reach;with family/visitor present     Time: 1191-4782 PT Time Calculation (min) (ACUTE ONLY): 39 min  Charges:  $Gait Training: 8-22 mins $Therapeutic Exercise: 23-37 mins                    G Codes:      Christiane Ha, PT, CSCS Pager [phone] Office [phone]  04/08/2015, 3:46 PM

## 2015-04-09 ENCOUNTER — Inpatient Hospital Stay (HOSPITAL_COMMUNITY)
Admission: AD | Admit: 2015-04-09 | Payer: Medicaid Other | Source: Intra-hospital | Admitting: Physical Medicine & Rehabilitation

## 2015-04-09 ENCOUNTER — Inpatient Hospital Stay (HOSPITAL_COMMUNITY)
Admission: AD | Admit: 2015-04-09 | Discharge: 2015-04-20 | DRG: 560 | Disposition: A | Discharge status: Home Health | Payer: Medicaid Other | Source: Intra-hospital | Attending: Physical Medicine & Rehabilitation | Admitting: Physical Medicine & Rehabilitation

## 2015-04-09 DIAGNOSIS — R000 Tachycardia, unspecified: Secondary | ICD-10-CM

## 2015-04-09 DIAGNOSIS — N179 Acute kidney failure, unspecified: Secondary | ICD-10-CM | POA: Diagnosis not present

## 2015-04-09 DIAGNOSIS — R5381 Other malaise: Secondary | ICD-10-CM | POA: Diagnosis not present

## 2015-04-09 DIAGNOSIS — E876 Hypokalemia: Secondary | ICD-10-CM | POA: Diagnosis present

## 2015-04-09 DIAGNOSIS — K5900 Constipation, unspecified: Secondary | ICD-10-CM | POA: Diagnosis not present

## 2015-04-09 DIAGNOSIS — E871 Hypo-osmolality and hyponatremia: Secondary | ICD-10-CM

## 2015-04-09 DIAGNOSIS — Z471 Aftercare following joint replacement surgery: Secondary | ICD-10-CM | POA: Diagnosis present

## 2015-04-09 DIAGNOSIS — D62 Acute posthemorrhagic anemia: Secondary | ICD-10-CM | POA: Diagnosis not present

## 2015-04-09 DIAGNOSIS — K219 Gastro-esophageal reflux disease without esophagitis: Secondary | ICD-10-CM | POA: Diagnosis not present

## 2015-04-09 DIAGNOSIS — F419 Anxiety disorder, unspecified: Secondary | ICD-10-CM

## 2015-04-09 DIAGNOSIS — D72829 Elevated white blood cell count, unspecified: Secondary | ICD-10-CM | POA: Diagnosis not present

## 2015-04-09 DIAGNOSIS — Z96643 Presence of artificial hip joint, bilateral: Secondary | ICD-10-CM

## 2015-04-09 DIAGNOSIS — R609 Edema, unspecified: Secondary | ICD-10-CM

## 2015-04-09 DIAGNOSIS — M160 Bilateral primary osteoarthritis of hip: Secondary | ICD-10-CM | POA: Diagnosis present

## 2015-04-09 DIAGNOSIS — M129 Arthropathy, unspecified: Secondary | ICD-10-CM

## 2015-04-09 LAB — CBC
HCT: 28.6 % — ABNORMAL LOW (ref 39.0–52.0)
HEMOGLOBIN: 9.9 g/dL — AB (ref 13.0–17.0)
MCH: 30.5 pg (ref 26.0–34.0)
MCHC: 34.6 g/dL (ref 30.0–36.0)
MCV: 88 fL (ref 78.0–100.0)
Platelets: 227 10*3/uL (ref 150–400)
RBC: 3.25 MIL/uL — AB (ref 4.22–5.81)
RDW: 13.8 % (ref 11.5–15.5)
WBC: 16.6 10*3/uL — AB (ref 4.0–10.5)

## 2015-04-09 MED ORDER — PHENOL 1.4 % MT LIQD: 1.0000 | OROMUCOSAL | Status: DC | PRN

## 2015-04-09 MED ORDER — MENTHOL 3 MG MT LOZG: 1.0000 | LOZENGE | OROMUCOSAL | Status: DC | PRN

## 2015-04-09 MED ORDER — ENSURE ENLIVE PO LIQD
237.0000 mL | Freq: Two times a day (BID) | ORAL | Status: DC
  Administered 2015-04-09 – 2015-04-11 (×3): 237 mL via ORAL

## 2015-04-09 MED ORDER — PROCHLORPERAZINE 25 MG RE SUPP: 12.5000 mg | Freq: Four times a day (QID) | RECTAL | Status: DC | PRN

## 2015-04-09 MED ORDER — TRIAMTERENE-HCTZ 37.5-25 MG PO TABS
1.0000 | ORAL_TABLET | Freq: Every day | ORAL | Status: DC
  Administered 2015-04-10 – 2015-04-20 (×11): 1 via ORAL
  Filled 2015-04-09 (×12): qty 1

## 2015-04-09 MED ORDER — PROCHLORPERAZINE EDISYLATE 5 MG/ML IJ SOLN: 5.0000 mg | Freq: Four times a day (QID) | INTRAMUSCULAR | Status: DC | PRN

## 2015-04-09 MED ORDER — ASPIRIN EC 325 MG PO TBEC
325.0000 mg | DELAYED_RELEASE_TABLET | Freq: Two times a day (BID) | ORAL | Status: DC
  Administered 2015-04-19 – 2015-04-20 (×3): 325 mg via ORAL
  Filled 2015-04-09 (×3): qty 1

## 2015-04-09 MED ORDER — METHOCARBAMOL 500 MG PO TABS
750.0000 mg | ORAL_TABLET | Freq: Four times a day (QID) | ORAL | Status: DC
  Administered 2015-04-09 – 2015-04-10 (×3): 750 mg via ORAL
  Filled 2015-04-09 (×3): qty 2

## 2015-04-09 MED ORDER — ENOXAPARIN SODIUM 40 MG/0.4ML ~~LOC~~ SOLN
40.0000 mg | SUBCUTANEOUS | Status: DC
  Administered 2015-04-09 – 2015-04-19 (×11): 40 mg via SUBCUTANEOUS
  Filled 2015-04-09 (×11): qty 0.4

## 2015-04-09 MED ORDER — SORBITOL 70 % SOLN
30.0000 mL | Freq: Once | Status: AC
  Administered 2015-04-09: 30 mL via ORAL
  Filled 2015-04-09: qty 30

## 2015-04-09 MED ORDER — POLYETHYLENE GLYCOL 3350 17 G PO PACK
17.0000 g | PACK | Freq: Every day | ORAL | Status: DC
  Administered 2015-04-09 – 2015-04-19 (×10): 17 g via ORAL
  Filled 2015-04-09 (×12): qty 1

## 2015-04-09 MED ORDER — OXYCODONE HCL 5 MG PO TABS
20.0000 mg | ORAL_TABLET | ORAL | Status: DC | PRN
  Administered 2015-04-09 – 2015-04-20 (×54): 20 mg via ORAL
  Filled 2015-04-09 (×54): qty 4

## 2015-04-09 MED ORDER — ACETAMINOPHEN 325 MG PO TABS
325.0000 mg | ORAL_TABLET | ORAL | Status: DC | PRN
  Administered 2015-04-14: 650 mg via ORAL
  Filled 2015-04-09: qty 2

## 2015-04-09 MED ORDER — POLYSACCHARIDE IRON COMPLEX 150 MG PO CAPS
150.0000 mg | ORAL_CAPSULE | Freq: Two times a day (BID) | ORAL | Status: DC
  Administered 2015-04-09 – 2015-04-19 (×21): 150 mg via ORAL
  Filled 2015-04-09 (×21): qty 1

## 2015-04-09 MED ORDER — TRAZODONE HCL 50 MG PO TABS
25.0000 mg | ORAL_TABLET | Freq: Every evening | ORAL | Status: DC | PRN
  Administered 2015-04-10: 50 mg via ORAL
  Filled 2015-04-09: qty 1

## 2015-04-09 MED ORDER — PROCHLORPERAZINE MALEATE 5 MG PO TABS: 5.0000 mg | ORAL_TABLET | Freq: Four times a day (QID) | ORAL | Status: DC | PRN

## 2015-04-09 MED ORDER — BISACODYL 10 MG RE SUPP
10.0000 mg | Freq: Every day | RECTAL | Status: DC | PRN
  Administered 2015-04-11 – 2015-04-14 (×2): 10 mg via RECTAL
  Filled 2015-04-09 (×2): qty 1

## 2015-04-09 MED ORDER — DIPHENHYDRAMINE HCL 12.5 MG/5ML PO ELIX: 12.5000 mg | ORAL_SOLUTION | ORAL | Status: DC | PRN

## 2015-04-09 MED ORDER — TRAMADOL HCL 50 MG PO TABS
50.0000 mg | ORAL_TABLET | Freq: Four times a day (QID) | ORAL | Status: DC | PRN
  Administered 2015-04-09 – 2015-04-19 (×18): 50 mg via ORAL
  Filled 2015-04-09 (×21): qty 1

## 2015-04-09 MED ORDER — FLEET ENEMA 7-19 GM/118ML RE ENEM: 1.0000 | ENEMA | Freq: Once | RECTAL | Status: DC | PRN

## 2015-04-09 MED ORDER — OXYCODONE HCL ER 20 MG PO T12A
20.0000 mg | EXTENDED_RELEASE_TABLET | Freq: Two times a day (BID) | ORAL | Status: DC
  Administered 2015-04-09 – 2015-04-14 (×11): 20 mg via ORAL
  Filled 2015-04-09 (×11): qty 1

## 2015-04-09 MED ORDER — GUAIFENESIN-DM 100-10 MG/5ML PO SYRP
5.0000 mL | ORAL_SOLUTION | Freq: Four times a day (QID) | ORAL | Status: DC | PRN
Start: 2015-04-09 — End: 2015-04-20

## 2015-04-09 MED ORDER — ALUM & MAG HYDROXIDE-SIMETH 200-200-20 MG/5ML PO SUSP: 30.0000 mL | ORAL | Status: DC | PRN

## 2015-04-09 NOTE — Progress Notes (Signed)
Patient ID: Spencer GoodKevin L Fischer, male   DOB: 10/21/1965, 49 y.o.   MRN: [id number] Doing well overall.  Medically stable.  Both hips doing well.  Making progress with therapy.  Inpatient rehab has been recommended.  Can be discharged to rehab today if accepted.

## 2015-04-09 NOTE — Discharge Summary (Signed)
Patient ID: JEANPAUL BIEHL MRN: [id number] DOB/AGE: 47-Sep-1967 47 y.o.  Admit date: 04/06/2015 Discharge date: 04/09/2015  Admission Diagnoses:  Principal Problem:   Osteoarthritis of bilateral hips Active Problems:   Status post bilateral hip replacements   Discharge Diagnoses:  Same  Past Medical History  Diagnosis Date  . Hypertension   . Carpal tunnel syndrome   . Arthritis   . Anxiety     Surgeries: Procedure(s): BILATERAL ANTERIOR TOTAL HIP ARTHROPLASTY on 04/06/2015   Consultants:    Discharged Condition: Improved  Hospital Course: Spencer Fischer is an 49 y.o. male who was admitted 04/06/2015 for operative treatment ofOsteoarthritis of left hip. Patient has severe unremitting pain that affects sleep, daily activities, and work/hobbies. After pre-op clearance the patient was taken to the operating room on 04/06/2015 and underwent  Procedure(s): BILATERAL ANTERIOR TOTAL HIP ARTHROPLASTY.    Patient was given perioperative antibiotics: Anti-infectives    Start     Dose/Rate Route Frequency Ordered Stop   04/07/15 0000  vancomycin (VANCOCIN) IVPB 1000 mg/200 mL premix     1,000 mg 200 mL/hr over 60 Minutes Intravenous Every 12 hours 04/06/15 1911 04/07/15 0039   04/06/15 1245  vancomycin (VANCOCIN) IVPB 1000 mg/200 mL premix  Status:  Discontinued     1,000 mg 200 mL/hr over 60 Minutes Intravenous Every 12 hours 04/06/15 1237 04/06/15 1853   04/06/15 1145  ceFAZolin (ANCEF) IVPB 2 g/50 mL premix     2 g 100 mL/hr over 30 Minutes Intravenous To [facility]Stay [facility] 04/05/15 1401 04/06/15 1235       Patient was given sequential compression devices, early ambulation, and chemoprophylaxis to prevent DVT.  Patient benefited maximally from hospital stay and there were no complications.    Recent vital signs: Patient Vitals for the past 24 hrs:  BP Temp Temp src Pulse Resp SpO2  04/09/15 0300 (!) 144/85 mmHg 99.2 F (37.3 C) Oral (!) 101 18 96 %  04/08/15 1937 133/68  mmHg 98.8 F (37.1 C) Oral (!) 101 16 97 %  04/08/15 1300 131/73 mmHg 98.3 F (36.8 C) - (!) 114 16 95 %     Recent laboratory studies:  Recent Labs  04/07/15 0655 04/08/15 0817 04/09/15 0501  WBC 13.3* 19.8* 16.6*  HGB 11.4* 11.5* 9.9*  HCT 33.2* 32.8* 28.6*  PLT 205 201 227  NA 130*  --   --   K 3.6  --   --   CL 98*  --   --   CO2 23  --   --   BUN 12  --   --   CREATININE 1.44*  --   --   GLUCOSE 125*  --   --   CALCIUM 8.7*  --   --      Discharge Medications:     Medication List    TAKE these medications        HYDROcodone-acetaminophen 5-325 MG tablet  Commonly known as:  NORCO/VICODIN  Take 2 tablets by mouth every 4 (four) hours as needed.     triamterene-hydrochlorothiazide 37.5-25 MG tablet  Commonly known as:  MAXZIDE-25  Take 1 tablet by mouth daily.        Diagnostic Studies: Dg Pelvis Portable  04/06/2015  CLINICAL DATA:  Status post bilateral hip arthroplasty EXAM: PORTABLE PELVIS 1-2 VIEWS COMPARISON:  None. FINDINGS: Bilateral hip replacements are noted. Pelvic ring appears intact. No acute bony abnormality is seen. IMPRESSION: Status post bilateral hip arthroplasty Electronically Signed   By:  Alcide CleverMark  Lukens M.D.   On: 04/06/2015 16:34   Dg Hip Operative Unilat With Pelvis Left  04/06/2015  CLINICAL DATA:  Patient status post left hip arthroplasty. EXAM: OPERATIVE LEFT HIP (WITH PELVIS IF PERFORMED) 2 VIEWS TECHNIQUE: Fluoroscopic spot image(s) were submitted for interpretation post-operatively. COMPARISON:  Hip radiographs 01/28/2015 FINDINGS: 2 intraoperative fluoroscopic images were submitted. Patient status post left hip arthroplasty. No definite evidence for acute abnormality. IMPRESSION: Patient status post left hip arthroplasty. Electronically Signed   By: Annia Beltrew  Davis M.D.   On: 04/06/2015 16:02   Dg Hip Operative Unilat With Pelvis Right  04/06/2015  CLINICAL DATA:  Patient status post right hip arthroplasty. EXAM: OPERATIVE RIGHT HIP  (WITH PELVIS IF PERFORMED) 2 VIEWS TECHNIQUE: Fluoroscopic spot image(s) were submitted for interpretation post-operatively. COMPARISON:  Pelvic radiograph 09/16/2014 FINDINGS: Patient status post right hip arthroplasty. No evidence for acute abnormality. IMPRESSION: Patient status post right hip arthroplasty.  No acute process. Electronically Signed   By: Annia Beltrew  Davis M.D.   On: 04/06/2015 16:04    Disposition: to inpatient rehab      Discharge Instructions    Discharge patient    Complete by:  As directed            Follow-up Information    Follow up with Kathryne HitchBLACKMAN,Tiffeny Minchew Y, MD In 2 weeks.   Specialty:  Orthopedic Surgery   Contact information:   [address] [place] [address] [phone]        Signed: Kathryne HitchBLACKMAN,Jarvis Knodel Y 04/09/2015, 11:31 AM

## 2015-04-09 NOTE — PMR Pre-admission (Signed)
PMR Admission Coordinator Pre-Admission Assessment  Patient: Spencer Fischer is an 49 y.o., male MRN: [id number] DOB: [birth year] Height: 5\' 11"  (180.3 cm) Weight: 104.781 kg (231 lb)              Insurance Information HMO:    PPO:      PCP:      IPA:      80/20:      OTHER:  PRIMARY: Medicaid of [place]      Policy#: [id number]      Subscriber: Juanita Craver CM Name:        Phone#:       Fax#:   Pre-Cert#:        Employer: [employer] Benefits:  Phone #: [phone]     Name: Automated Eff. Date: Eligible 04/09/15     Deduct:        Out of Pocket Max:        Life Max:   CIR:        SNF:   Outpatient:       Co-Pay:   Home Health:        Co-Pay:   DME:       Co-Pay:   Providers:    Medicaid Application Date:        Case Manager:   Disability Application Date:        Case Worker:    Emergency Contact Information Contact Information    Name Relation Home Work Mobile   Seba Dalkai Mother [phone]     Vivianne Master [phone]       Current Medical History  Patient Admitting Diagnosis:  B THR  History of Present Illness:A 49 y.o. male with history HTN, anxiety, progressive hip pain due to OA for the past 5 years and failure of conservative therapy. His pain was 10/10 in severity and he had associated difficulties with ambulation. He failed conservative treatment and elected to undergo B-THR on 04/06/15 by Dr. Magnus Ivan. Post-op course complicated by pain, tachycardia, AKI as well as ABLA. Therapy ongoing and CIR was recommended by MD and PT for follow up therapy.     Past Medical History  Past Medical History  Diagnosis Date  . Hypertension   . Carpal tunnel syndrome   . Arthritis   . Anxiety     Family History  family history is not on file.  Prior Rehab/Hospitalizations: No previous rehab admissions   Has the patient had major surgery during 100 days prior to admission? No  Current Medications   Current facility-administered medications:  .  acetaminophen (TYLENOL)  tablet 650 mg, 650 mg, Oral, Q6H PRN **OR** acetaminophen (TYLENOL) suppository 650 mg, 650 mg, Rectal, Q6H PRN, Kathryne Hitch, MD .  alum & mag hydroxide-simeth (MAALOX/MYLANTA) 200-200-20 MG/5ML suspension 30 mL, 30 mL, Oral, Q4H PRN, Kathryne Hitch, MD .  aspirin EC tablet 325 mg, 325 mg, Oral, BID PC, Kathryne Hitch, MD, 325 mg at 04/09/15 0102 .  diphenhydrAMINE (BENADRYL) 12.5 MG/5ML elixir 12.5-25 mg, 12.5-25 mg, Oral, Q4H PRN, Kathryne Hitch, MD .  docusate sodium (COLACE) capsule 100 mg, 100 mg, Oral, BID, Kathryne Hitch, MD, 100 mg at 04/09/15 7253 .  HYDROmorphone (DILAUDID) injection 1 mg, 1 mg, Intravenous, Q2H PRN, Kathryne Hitch, MD .  menthol-cetylpyridinium (CEPACOL) lozenge 3 mg, 1 lozenge, Oral, PRN **OR** phenol (CHLORASEPTIC) mouth spray 1 spray, 1 spray, Mouth/Throat, PRN, Kathryne Hitch, MD .  methocarbamol (ROBAXIN) tablet 500 mg, 500 mg, Oral, Q6H PRN, 500  mg at 04/09/15 0556 **OR** methocarbamol (ROBAXIN) 500 mg in dextrose 5 % 50 mL IVPB, 500 mg, Intravenous, Q6H PRN, Kathryne Hitch, MD .  metoCLOPramide (REGLAN) tablet 5-10 mg, 5-10 mg, Oral, Q8H PRN **OR** metoCLOPramide (REGLAN) injection 5-10 mg, 5-10 mg, Intravenous, Q8H PRN, Kathryne Hitch, MD .  ondansetron [facility]) tablet 4 mg, 4 mg, Oral, Q6H PRN **OR** ondansetron (ZOFRAN) injection 4 mg, 4 mg, Intravenous, Q6H PRN, Kathryne Hitch, MD .  oxyCODONE (Oxy IR/ROXICODONE) immediate release tablet 5-15 mg, 5-15 mg, Oral, Q3H PRN, Kathryne Hitch, MD, 15 mg at 04/09/15 0924 .  polyethylene glycol (MIRALAX / GLYCOLAX) packet 17 g, 17 g, Oral, Daily PRN, Kathryne Hitch, MD .  ropivacaine (PF) 2 mg/ml (0.2%) (NAROPIN) epidural, 5 mL/hr, Epidural, Continuous, Val Eagle, MD, Last Rate: 5 mL/hr at 04/06/15 1728, 5 mL/hr at 04/06/15 1728 .  triamterene-hydrochlorothiazide (MAXZIDE-25) 37.5-25 MG per tablet 1 tablet, 1 tablet,  Oral, Daily, Kathryne Hitch, MD, 1 tablet at 04/09/15 1610 .  zolpidem (AMBIEN) tablet 5 mg, 5 mg, Oral, QHS PRN,MR X 1, Kathryne Hitch, MD  Patients Current Diet: Diet regular Room service appropriate?: Yes; Fluid consistency:: Thin  Precautions / Restrictions Precautions Precautions: Fall Restrictions Weight Bearing Restrictions: Yes RLE Weight Bearing: Weight bearing as tolerated LLE Weight Bearing: Weight bearing as tolerated   Has the patient had 2 or more falls or a fall with injury in the past year?No  Prior Activity Level Community (5-7x/wk): Went out daily.  Took the bus.  Out of work for the past 2 yrs and looking for a job.  Home Assistive Devices / Equipment Home Assistive Devices/Equipment: Eyeglasses  Prior Device Use: Indicate devices/aids used by the patient prior to current illness, exacerbation or injury? used a stick or a cane.  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  Needed some help with his socks and his pants.  Needed help at times off and on the toilet.  Indoor Mobility: Did the patient need assistance with walking from room to room (with or without device)? Independent  Stairs: Did the patient need assistance with internal or external stairs (with or without device)? Needed some help  Functional Cognition: Did the patient need help planning regular tasks such as shopping or remembering to take medications? Independent  Current Functional Level Cognition  Overall Cognitive Status: Within Functional Limits for tasks assessed Orientation Level: Oriented X4    Extremity Assessment (includes Sensation/Coordination)  Upper Extremity Assessment: Overall WFL for tasks assessed   Lower Extremity Assessment: Defer to PT evaluation RLE Deficits / Details: pt still with epidural so unable to accurately assess strength at this point. 3/5 knee extension and 2/5 hip flex witnessed with mobility RLE Sensation: decreased light touch, decreased proprioception RLE Coordination: decreased gross motor LLE Deficits / Details: same as RLE LLE Sensation: decreased light touch, decreased proprioception LLE Coordination: decreased gross motor    ADLs  Overall ADL's : Needs assistance/impaired Grooming: Set up Upper Body Bathing: Set up, Sitting Lower Body Bathing: Sit to/from stand, Maximal assistance Upper Body Dressing : Set up, Sitting Lower Body Dressing: Maximal assistance, Sit to/from stand Toilet Transfer: Minimal assistance, RW, Ambulation Toileting- Clothing Manipulation and Hygiene: Minimal assistance, Sit to/from stand Functional  mobility during ADLs: Minimal assistance, Rolling walker, Cueing for safety, Cueing for sequencing General ADL Comments: Pt will benefit from AE for LB ADL. Able to ambulate @ 15 ft before requiring a break. Began education on availability of AE. Pt states he is homeless but that the friend that he will live with after D/C has a tub/shower and bathroom is RW accessible.     Mobility  Overal bed mobility: Needs Assistance Bed Mobility: Supine to Sit Supine to sit: Mod assist, HOB elevated Sit to supine: Min assist General bed mobility comments: pt OOB in chair    Transfers  Overall transfer level: Needs assistance Equipment used: Rolling walker (2 wheeled) Transfers: Sit to/from Stand Sit to Stand: Min assist Stand pivot transfers: From elevated surface General transfer comment: from recliner    Ambulation / Gait / Stairs / Psychologist, prison and probation services  Ambulation/Gait Ambulation/Gait assistance: Hydrographic surveyor (Feet): 55 Feet Assistive device: Rolling walker (2 wheeled) Gait Pattern/deviations: Decreased step length - right,  Decreased step length - left General Gait Details: slow pattern, noted instability but no gross loss of balance. Patient unable to take full strides with either LE.  Gait velocity: very slow pattern Gait velocity interpretation: <1.8 ft/sec, indicative of risk for recurrent falls    Posture / Balance Balance Overall balance assessment: Needs assistance Sitting-balance support: No upper extremity supported Sitting balance-Leahy Scale: Good Standing balance support: Bilateral upper extremity supported Standing balance-Leahy Scale: Poor Standing balance comment: heavy reliance on RW. [place] to release to complete pericare    Special needs/care consideration BiPAP/CPAP No CPM No Continuous Drip IV No Dialysis No      Life Vest No Oxygen No Special Bed No Trach Size No Wound Vac (area) No     Skin Has new bilateral hip post op incisions with dressings in place                            Bowel mgmt: No BM since 04/05/15 Bladder mgmt: Voiding in urinal with assistance Diabetic mgmt: No    Previous Home Environment Living Arrangements: Other relatives, Non-relatives/Friends Home Care Services: No Additional Comments: Pt states he is homeless adn stays with friends/families  Discharge Living Setting Plans for Discharge Living Setting: House, Other (Comment) (Plans to go home with sister so he will have a bed.) Type of Home at Discharge: House Discharge Home Layout: One level Discharge Home Access: Stairs to enter Entrance Stairs-Number of Steps: 2 onto porch and 1 into house Does the patient have any problems obtaining your medications?: No  Social/Family/Support Systems Patient Roles: Other (Comment) (Has a mom and a sister.) Contact Information: Seleta Rhymes - mother [phone] Anticipated Caregiver: self and sister Anticipated Caregiver's Contact Information: Burman Blacksmith - sister [phone] Ability/Limitations of Caregiver: Sister works, but has flexible hours Caregiver  Availability: Intermittent Discharge Plan Discussed with Primary Caregiver: Yes Is Caregiver In Agreement with Plan?: Yes Does Caregiver/Family have Issues with Lodging/Transportation while Pt is in Rehab?: No  Goals/Additional Needs Patient/Family Goal for Rehab: PT/OT mod I and supervision goals Expected length of stay: 7-10 days Cultural Considerations: None Dietary Needs: Regular diet, thin liquids Equipment Needs: TBD Pt/Family Agrees to Admission and willing to participate: Yes Program Orientation Provided & Reviewed with Pt/Caregiver Including Roles  & Responsibilities: Yes  Decrease burden of Care through IP rehab admission: N/A  Possible need for SNF placement upon discharge: Not planned  Patient Condition: This patient's medical and functional status  has changed since the consult dated: 04/07/15 in which the Rehabilitation Physician determined and documented that the patient's condition is appropriate for intensive rehabilitative care in an inpatient rehabilitation facility. See "History of Present Illness" (above) for medical update. Functional changes are: Currently requiring mod assist for bed mobility and min guard to ambulate 55 ft with RW. Patient's medical and functional status update has been discussed with the Rehabilitation physician and patient remains appropriate for inpatient rehabilitation. Will admit to inpatient rehab today.  Preadmission Screen Completed By:  Trish MageLogue, Levone Otten M, 04/09/2015 10:56 AM ______________________________________________________________________   Discussed status with Dr. Allena KatzPatel on 04/09/15 at  1055 and received telephone approval for admission today.  Admission Coordinator:  Trish MageLogue, Kaylia Winborne M, time1055/Date10/28/16

## 2015-04-09 NOTE — H&P (Addendum)
  Physical Medicine and Rehabilitation Admission H&P    CC: Impaired gait and pain after b/l THA.  HPI:   Spencer Fischer is a 49 y.o. male with history HTN, anxiety, progressive hip pain due to OA for the past 5 years and failure of conservative therapy. His pain was 10/10 in severity and he had associated difficulties with ambulation. He failed conservative treatment and elected to undergo B-THR on 04/06/15 by Dr. Blackman. Post-op course complicated by pain, tachycardia, AKI as well as  ABLA.  Today, pain is 7/10, non-radiating, and improved with medications.  He denies associated numbness/tingling.  Therapy ongoing and CIR was recommended by MD and PT for follow up therapy.   Review of Systems  Musculoskeletal: Positive for myalgias and joint pain.  Neurological:       B/l CTS  All other systems reviewed and are negative.  Past Medical History  Diagnosis Date  . Hypertension   . Carpal tunnel syndrome   . Arthritis   . Anxiety    Past Surgical History  Procedure Laterality Date  . Knee surgery Left   . Bilateral anterior total hip arthroplasty Bilateral 04/06/2015    Procedure: BILATERAL ANTERIOR TOTAL HIP ARTHROPLASTY;  Surgeon: Christopher Y Blackman, MD;  Location: [facility] OR;  Service: Orthopedics;  Laterality: Bilateral;   History reviewed. No pertinent family history. Social History:  reports that he has been smoking Cigarettes.  He has been smoking about 0.25 packs per day. He has never used smokeless tobacco. He reports that he drinks about 3.6 oz of alcohol per week. He reports that he does not use illicit drugs. Allergies: No Known Allergies Medications Prior to Admission  Medication Sig Dispense Refill  . HYDROcodone-acetaminophen (NORCO/VICODIN) 5-325 MG per tablet Take 2 tablets by mouth every 4 (four) hours as needed. 10 tablet 0  . triamterene-hydrochlorothiazide (MAXZIDE-25) 37.5-25 MG tablet Take 1 tablet by mouth daily.  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  Functional Status:  Mobility: Bed Mobility Overal bed mobility: Needs Assistance Bed Mobility: Supine to Sit Supine to sit: Mod assist, HOB elevated Sit to supine: Min assist General bed mobility comments: pt OOB in chair Transfers Overall transfer level: Needs assistance Equipment used: Rolling walker (2 wheeled) Transfers: Sit to/from Stand Sit to Stand: Min assist Stand pivot transfers: From elevated surface General transfer comment: from recliner Ambulation/Gait Ambulation/Gait assistance: Min guard Ambulation Distance (Feet): 55 Feet Assistive device: Rolling walker (2 wheeled) Gait Pattern/deviations: Decreased step length - right, Decreased step length - left General Gait Details: slow pattern, noted instability but no gross loss of balance. Patient unable to take full strides with either LE.  Gait velocity: very slow pattern Gait velocity interpretation: <1.8 ft/sec, indicative of risk for recurrent falls    ADL: ADL Overall ADL's : Needs assistance/impaired Grooming: Set up Upper Body Bathing: Set up, Sitting Lower Body Bathing: Sit to/from stand, Maximal assistance Upper Body Dressing : Set up, Sitting Lower Body Dressing: Maximal assistance, Sit to/from stand Toilet Transfer: Minimal assistance, RW, Ambulation Toileting- Clothing Manipulation and Hygiene: Minimal assistance, Sit to/from stand Functional mobility during ADLs: Minimal assistance, Rolling walker, Cueing for   safety, Cueing for sequencing General ADL Comments: Pt will benefit from AE for LB ADL. Able to ambulate @  15 ft before requiring a break. Began education on availability of AE. Pt states he is homeless but that the friend that he will live with after D/C has a tub/shower and bathroom is RW accessible.   Cognition: Cognition Overall Cognitive Status: Within Functional Limits for tasks assessed Orientation Level: Oriented X4 Cognition Arousal/Alertness: Awake/alert Behavior During Therapy: WFL for tasks assessed/performed Overall Cognitive Status: Within Functional Limits for tasks assessed    Blood pressure 144/85, pulse 101, temperature 99.2 F (37.3 C), temperature source Oral, resp. rate 18, height  (1.803 m), weight 104.781 kg (231 lb), SpO2 96 %. Physical Exam  Vitals reviewed. Constitutional: He is oriented to person, place, and time. He appears well-developed and well-nourished.  HENT:  Head: Normocephalic and atraumatic.  Eyes: Conjunctivae and EOM are normal.  Neck: Normal range of motion. Neck supple.  Cardiovascular: Normal rate and regular rhythm.   No murmur heard. Respiratory: Effort normal and breath sounds normal. No respiratory distress.  GI: Soft. Bowel sounds are normal. He exhibits no distension.  Musculoskeletal: He exhibits edema and tenderness.  Strength b/l UE 5/5 grossly B/l LE hip flex 3-/5 grossly (limited by pain), 5/5 ankle dorsi/plantar flexion   Neurological: He is alert and oriented to person, place, and time.  Skin: Skin is warm and dry.  Incisions c/d/i  Psychiatric: He has a normal mood and affect. His behavior is normal.    Results for orders placed or performed during the hospital encounter of 04/06/15 (from the past 48 hour(s))  CBC     Status: Abnormal   Collection Time: 04/08/15  8:17 AM  Result Value Ref Range   WBC 19.8 (H) 4.0 - 10.5 K/uL   RBC 3.68 (L) 4.22 - 5.81 MIL/uL   Hemoglobin 11.5 (L) 13.0 - 17.0 g/dL   HCT 40.9 (L) 81.1 - 91.4 %   MCV 89.1 78.0 - 100.0 fL   MCH 31.3 26.0 - 34.0 pg   MCHC 35.1 30.0 - 36.0 g/dL   RDW  78.2 95.6 - 21.3 %   Platelets 201 150 - 400 K/uL  CBC     Status: Abnormal   Collection Time: 04/09/15  5:01 AM  Result Value Ref Range   WBC 16.6 (H) 4.0 - 10.5 K/uL   RBC 3.25 (L) 4.22 - 5.81 MIL/uL   Hemoglobin 9.9 (L) 13.0 - 17.0 g/dL   HCT 08.6 (L) 57.8 - 46.9 %   MCV 88.0 78.0 - 100.0 fL   MCH 30.5 26.0 - 34.0 pg   MCHC 34.6 30.0 - 36.0 g/dL   RDW 62.9 52.8 - 41.3 %   Platelets 227 150 - 400 K/uL   No results found.     Medical Problem List and Plan: 1. Functional deficits secondary to end stage OA s/p b/l THA 2.  DVT Prophylaxis/Anticoagulation: Pharmaceutical: Lovenox. Will check doppler to rule out DVT. Resume ASA at discharge.  3. Pain Management:  Poor as using oxycodone 15 mg every 3 hours at this time. Will add OxyContin for more consistent coverage. Wean at discharge. Ice after therapy.  4. Mood: LCSW to follow for evaluation and support.  5. Neuropsych: This patient is capable of making decisions on his own behalf. 6. Skin/Wound Care: Routine pressure relief measures.  7. Fluids/Electrolytes/Nutrition:  Monitor I/O. Offer supplements between meals.  8. ABLA: Hgb down to 9.9 today. Add iron supplement. Recheck  CBC in am.  9. AKI: Push po fluids. Recheck lytes in am.  10. Leucocytosis: Monitor for signs of infection.   11. Resting tachycardia: Question due to deconditioning and pain. Monitor for symptoms with activity.   Post Admission Physician Evaluation: 1. Functional deficits secondary  to end stage OA s/p b/l THA. 2. Patient is admitted to receive collaborative, interdisciplinary care between the physiatrist, rehab nursing staff, and therapy team. 3. Patient's level of medical complexity and substantial therapy needs in context of that medical necessity cannot be provided at a lesser intensity of care such as a SNF. 4. Patient has experienced substantial functional loss from his/her baseline which was documented above under the "Functional History" and  "Functional Status" headings.  Judging by the patient's diagnosis, physical exam, and functional history, the patient has potential for functional progress which will result in measurable gains while on inpatient rehab.  These gains will be of substantial and practical use upon discharge  in facilitating mobility and self-care at the household level. 5. Physiatrist will provide 24 hour management of medical needs as well as oversight of the therapy plan/treatment and provide guidance as appropriate regarding the interaction of the two. 6. 24 hour rehab nursing will assist with safety, skin/wound care, disease management, medication administration, pain management and patient education and help integrate therapy concepts, techniques,education, etc. 7. PT will assess and treat for/with: Lower extremity strength, range of motion, stamina, balance, functional mobility, safety, adaptive techniques and equipment, woundcare, coping skills, pain control, ROM education.   Goals are: Mod I/Supervision. 8. OT will assess and treat for/with: ADL's, functional mobility, safety, upper extremity strength, adaptive techniques and equipment, wound mgt, ego support, and community reintegration.   Goals are: Mod I/Supervision. Therapy may proceed with showering this patient. 9. Case Management and Social Worker will assess and treat for psychological issues and discharge planning. 10. Team conference will be held weekly to assess progress toward goals and to determine barriers to discharge. 11. Patient will receive at least 3 hours of therapy per day at least 5 days per week. 12. ELOS: 10-12 days.       13. Prognosis:  excellent   Maryla MorrowAnkit Patel, MD  04/09/2015

## 2015-04-09 NOTE — Discharge Instructions (Signed)

## 2015-04-09 NOTE — Progress Notes (Signed)
Patient arrived from 5N via bed with RN, belongings, and friend. Patient oriented to rehab process, fall prevention plan, safety plan, rehab schedule, and nurse call system with verbal understanding. Per this RN request, patient received 30mL Sorbitol prior to arriving to 4M12 due to last BM of 04/05/15. Patient resting comfortably in bed with call bell at side. Will continue to monitor for pain and bowel movement.

## 2015-04-09 NOTE — Progress Notes (Signed)
Pt ready for transfer to 4M12. [facility] Questions answered and per request pt received an order for Sorbitol. Pt's belongings will be transported in the bed with him, and pt's friend with the trach will accompany him to the new room. Will continue to monitor. Sedonia SmallKatelin E Sawicki, RN

## 2015-04-09 NOTE — Progress Notes (Signed)
Rehab admissions - I met with patient and his friend at the bedside.  Patient now plans to go home with his sister after rehab stay.  He will have a bed at his sister's home.  Patient very much agrees to inpatient rehab admission.  Bed available and will admit to acute inpatient rehab today.  Call me for questions.  #[phone]

## 2015-04-10 ENCOUNTER — Inpatient Hospital Stay (HOSPITAL_COMMUNITY): Payer: Medicaid Other

## 2015-04-10 ENCOUNTER — Inpatient Hospital Stay (HOSPITAL_COMMUNITY): Payer: No Typology Code available for payment source | Admitting: Occupational Therapy

## 2015-04-10 ENCOUNTER — Inpatient Hospital Stay (HOSPITAL_COMMUNITY): Payer: Medicaid Other | Admitting: Physical Therapy

## 2015-04-10 DIAGNOSIS — R609 Edema, unspecified: Secondary | ICD-10-CM

## 2015-04-10 DIAGNOSIS — E876 Hypokalemia: Secondary | ICD-10-CM

## 2015-04-10 DIAGNOSIS — Z96643 Presence of artificial hip joint, bilateral: Secondary | ICD-10-CM

## 2015-04-10 DIAGNOSIS — M129 Arthropathy, unspecified: Secondary | ICD-10-CM

## 2015-04-10 DIAGNOSIS — D62 Acute posthemorrhagic anemia: Secondary | ICD-10-CM

## 2015-04-10 DIAGNOSIS — E871 Hypo-osmolality and hyponatremia: Secondary | ICD-10-CM

## 2015-04-10 LAB — CBC WITH DIFFERENTIAL/PLATELET
BASOS PCT: 0 %
Basophils Absolute: 0 10*3/uL (ref 0.0–0.1)
Eosinophils Absolute: 0.3 10*3/uL (ref 0.0–0.7)
Eosinophils Relative: 2 %
HCT: 28 % — ABNORMAL LOW (ref 39.0–52.0)
Hemoglobin: 9.7 g/dL — ABNORMAL LOW (ref 13.0–17.0)
Lymphocytes Relative: 18 %
Lymphs Abs: 2.7 10*3/uL (ref 0.7–4.0)
MCH: 30.9 pg (ref 26.0–34.0)
MCHC: 34.6 g/dL (ref 30.0–36.0)
MCV: 89.2 fL (ref 78.0–100.0)
MONO ABS: 1.6 10*3/uL — AB (ref 0.1–1.0)
MONOS PCT: 11 %
NEUTROS ABS: 10.2 10*3/uL — AB (ref 1.7–7.7)
Neutrophils Relative %: 69 %
Platelets: 281 10*3/uL (ref 150–400)
RBC: 3.14 MIL/uL — ABNORMAL LOW (ref 4.22–5.81)
RDW: 13.8 % (ref 11.5–15.5)
WBC: 14.9 10*3/uL — ABNORMAL HIGH (ref 4.0–10.5)

## 2015-04-10 LAB — COMPREHENSIVE METABOLIC PANEL
ALT: 25 U/L (ref 17–63)
ANION GAP: 8 (ref 5–15)
AST: 29 U/L (ref 15–41)
Albumin: 2.3 g/dL — ABNORMAL LOW (ref 3.5–5.0)
Alkaline Phosphatase: 65 U/L (ref 38–126)
BILIRUBIN TOTAL: 0.6 mg/dL (ref 0.3–1.2)
BUN: 13 mg/dL (ref 6–20)
CO2: 28 mmol/L (ref 22–32)
Calcium: 8.8 mg/dL — ABNORMAL LOW (ref 8.9–10.3)
Chloride: 93 mmol/L — ABNORMAL LOW (ref 101–111)
Creatinine, Ser: 1.27 mg/dL — ABNORMAL HIGH (ref 0.61–1.24)
Glucose, Bld: 150 mg/dL — ABNORMAL HIGH (ref 65–99)
POTASSIUM: 3.1 mmol/L — AB (ref 3.5–5.1)
Sodium: 129 mmol/L — ABNORMAL LOW (ref 135–145)
TOTAL PROTEIN: 6 g/dL — AB (ref 6.5–8.1)

## 2015-04-10 MED ORDER — SORBITOL 70 % SOLN
60.0000 mL | Status: AC
  Administered 2015-04-10: 60 mL via ORAL
  Filled 2015-04-10: qty 60

## 2015-04-10 MED ORDER — TRAZODONE HCL 50 MG PO TABS
50.0000 mg | ORAL_TABLET | Freq: Every evening | ORAL | Status: DC | PRN
  Administered 2015-04-11 – 2015-04-20 (×9): 50 mg via ORAL
  Filled 2015-04-10 (×10): qty 1

## 2015-04-10 MED ORDER — POTASSIUM CHLORIDE CRYS ER 20 MEQ PO TBCR
20.0000 meq | EXTENDED_RELEASE_TABLET | Freq: Every day | ORAL | Status: DC
  Administered 2015-04-10 – 2015-04-20 (×11): 20 meq via ORAL
  Filled 2015-04-10 (×11): qty 1

## 2015-04-10 MED ORDER — METHOCARBAMOL 500 MG PO TABS
1000.0000 mg | ORAL_TABLET | Freq: Four times a day (QID) | ORAL | Status: DC
  Administered 2015-04-10 – 2015-04-20 (×40): 1000 mg via ORAL
  Filled 2015-04-10 (×42): qty 2

## 2015-04-10 NOTE — Evaluation (Signed)
Occupational Therapy Assessment and Plan  Patient Details  Name: Spencer Fischer MRN: [id number] Date of Birth: Jul 29, 1965  OT Diagnosis: acute pain and pain in joint Rehab Potential: Rehab Potential (ACUTE ONLY): Good ELOS: 7-10 days   Today's Date: 04/10/2015 OT Individual Time:  - 1000-1100  ()60 min)   1st session         Problem List:  Patient Active Problem List   Diagnosis Date Noted  . Arthritis of both hips 04/09/2015  . Osteoarthritis of bilateral hips 04/06/2015  . 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    Past Medical History:  Past Medical History  Diagnosis Date  . Hypertension   . Carpal tunnel syndrome   . Arthritis   . Anxiety    Past Surgical History:  Past Surgical History  Procedure Laterality Date  . Knee surgery Left   . Bilateral anterior total hip arthroplasty Bilateral 04/06/2015    Procedure: BILATERAL ANTERIOR TOTAL HIP ARTHROPLASTY;  Surgeon: Mcarthur Rossetti, MD;  Location: [facility];  Service: Orthopedics;  Laterality: Bilateral;    Assessment & Plan Clinical Impression: HPI: Spencer Fischer is a 49 y.o. male with history HTN, anxiety, progressive hip pain due to OA for the past 5 years and failure of conservative therapy. His pain was 10/10 in severity and he had associated difficulties with ambulation. He failed conservative treatment and elected to undergo B-THR on 04/06/15 by Dr. Ninfa Linden. Post-op course complicated by pain, tachycardia, AKI as well as ABLA. Today, pain is 7/10, non-radiating, and improved with medications. He denies associated numbness/tingling. Therapy ongoing and CIR was recommended   Patient transferred to CIR on 04/09/2015 .    Patient currently requires mod with basic self-care skills secondary to muscle weakness and muscle joint tightness.  Prior to hospitalization, patient could complete BADL and IADL with independent .  Patient will benefit from skilled intervention to increase independence with basic  self-care skills and increase level of independence with iADL prior to discharge home independently.  Anticipate patient will require intermittent supervision and follow up home health.  OT - End of Session Activity Tolerance: Tolerates 30+ min activity with multiple rests Endurance Deficit: Yes Endurance Deficit Description: goes to bed at end of session OT Assessment Rehab Potential (ACUTE ONLY): Good Barriers to Discharge: Decreased caregiver support OT Plan OT Intensity: Minimum of 1-2 x/day, 45 to 90 minutes OT Frequency: 5 out of 7 days OT Duration/Estimated Length of Stay: 7-10 days OT Treatment/Interventions: Pain management;Neuromuscular re-education;Functional mobility training;DME/adaptive equipment instruction;Discharge planning;Balance/vestibular training;Patient/family education;Self Care/advanced ADL retraining OT Recommendation Recommendations for Other Services:  (none) Patient destination: Home Follow Up Recommendations: Home health OT Equipment Recommended: 3 in 1 bedside comode;Tub/shower seat   Skilled Therapeutic Intervention  1st session:   Pain=  8/10  Pt engaged in bathing and dressing at sink level  Did tolerated standing balance for 2- 3 minutes while cleaning self.  Pt ambulated to toilet with RW.  Continent of urine.  Pt ambulated with RW back to bed.   2nd session:  1615 -1700   Pain: 8/10  Explained OT POC and OT  Goals with pt being mod I with bathing, dressing, shower transfer, toilet., meal prep, laundry, light housekeeping.  Pt. Left with all needs in place  OT Evaluation Precautions/Restrictions  Precautions Precautions: Fall Restrictions Weight Bearing Restrictions: Yes RLE Weight Bearing: Weight bearing as tolerated LLE Weight Bearing: Weight bearing as tolerated    Pain Pain Assessment Pain Assessment: 0-10 Pain Score: 7  Pain Type: Acute pain Pain Location: Hip Pain Orientation: Right;Left Pain Descriptors / Indicators: Aching Pain  Onset: On-going Pain Intervention(s): Medication (See eMAR) Multiple Pain Sites: No Home Living/Prior Functioning Home Living Home Access:  (2) Home Layout: One level Bathroom Shower/Tub: Gaffer, Architectural technologist: Associate Professor Accessibility: Yes Additional Comments: Pt states he is homeless and stays with friends/families  Lives With: Family IADL History Homemaking Responsibilities: Yes Meal Prep Responsibility: Primary Laundry Responsibility: Primary Cleaning Responsibility: Secondary Bill Paying/Finance Responsibility: Primary Shopping Responsibility: Secondary Current License: No Leisure and Hobbies: baseball Prior Function Level of Independence: Needs assistance with homemaking, Independent with basic ADLs, Needs assistance with gait, Needs assistance with tranfers Light Housekeeping: Minimal  Able to Take Stairs?: No Driving: No Vocation: Unemployed Leisure:  (baseball;) Comments: pt has been unable to work due to hip pain, 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    Vision/Perception  Vision- History Baseline Vision/History: Wears glasses Wears Glasses: At all times Patient Visual Report: No change from baseline Vision- Assessment Vision Assessment?: No apparent visual deficits Perception Perception: Within Functional Limits Comments: appears wfl  Cognition Overall Cognitive Status: Within Functional Limits for tasks assessed Arousal/Alertness: Awake/alert Month: October Day of Week: Incorrect (said Friday and it was [place]) Memory: Appears intact Memory Impairment: Decreased recall of new information (2/4 words recalled) Immediate Memory Recall: Sock;Blue;Bed Memory Recall: Sock;Blue;Bed Memory Recall Sock: Without Cue Memory Recall Blue: Without Cue Memory Recall Bed: With Cue Attention: Sustained Focused Attention: Appears intact Sustained Attention: Appears intact Awareness:  Appears intact Problem Solving: Appears intact Executive Function:  (intact) Safety/Judgment: Appears intact Sensation Sensation Light Touch: Impaired by gross assessment (carpel tunnel syndrome) Light Touch Impaired Details: Impaired LUE;Impaired RUE Stereognosis: Not tested Hot/Cold: Not tested Proprioception: Appears Intact Additional Comments: intermittent numb/tingly in B hands and B shoulders - pt reports from carpal tunnel Coordination Gross Motor Movements are Fluid and Coordinated: Yes Fine Motor Movements are Fluid and Coordinated: No (CTS with decreased grip, writers cramp, drops things) Finger Nose Finger Test: wfl B LEs Heel Shin Test: NT due to hip pain Motor  Motor Motor: Within Functional Limits Motor - Skilled Clinical Observations: slow purposedful movement Mobility  Bed Mobility Bed Mobility: Supine to Sit;Sit to Supine Rolling Right: 4: Min assist Rolling Right Details: Manual facilitation for weight shifting;Verbal cues for technique Rolling Left: 4: Min assist Rolling Left Details: Manual facilitation for weight shifting;Verbal cues for technique Supine to Sit: 5: Supervision Supine to Sit Details: Tactile cues for placement;Verbal cues for precautions/safety Sit to Supine: 4: Min assist;HOB flat Sit to Supine - Details: Verbal cues for precautions/safety;Verbal cues for sequencing;Visual cues/gestures for precautions/safety Transfers Sit to Stand: 4: Min assist Sit to Stand Details: Tactile cues for initiation  Trunk/Postural Assessment  Cervical Assessment Cervical Assessment: Within Functional Limits Thoracic Assessment Thoracic Assessment: Within Functional Limits Lumbar Assessment Lumbar Assessment: Within Functional Limits Postural Control Postural Control: Deficits on evaluation Protective Responses: slow  Balance Balance Balance Assessed: Yes Static Sitting Balance Static Sitting - Balance Support: Bilateral upper extremity  supported Static Sitting - Level of Assistance: 5: Stand by assistance Dynamic Sitting Balance Dynamic Sitting - Balance Support: Bilateral upper extremity supported Dynamic Sitting - Level of Assistance: 5: Stand by assistance Dynamic Sitting - Balance Activities: Reaching for objects;Reaching across midline (SBA) Static Standing Balance Static Standing - Balance Support: Bilateral upper extremity supported;During functional activity Static Standing - Level of Assistance: 4: Min assist Dynamic Standing Balance  Dynamic Standing - Balance Support: Bilateral upper extremity supported;During functional activity Dynamic Standing - Level of Assistance: 4: Min assist Extremity/Trunk Assessment RUE Assessment RUE Assessment: Within Functional Limits LUE Assessment LUE Assessment: Within Functional Limits (CTS in hand)   See Function Navigator for Current Functional Status.   Refer to Care Plan for Long Term Goals  Recommendations for other services: None  Discharge Criteria: Patient will be discharged from OT if patient refuses treatment 3 consecutive times without medical reason, if treatment goals not met, if there is a change in medical status, if patient makes no progress towards goals or if patient is discharged from hospital.  The above assessment, treatment plan, treatment alternatives and goals were discussed and mutually agreed upon: by patient  Lisa Roca 04/10/2015, 4:43 PM

## 2015-04-10 NOTE — Progress Notes (Signed)
VASCULAR LAB PRELIMINARY  PRELIMINARY  PRELIMINARY  PRELIMINARY  Bilateral lower extremity venous duplex completed.    Preliminary report:  Bilateral:  No evidence of DVT, superficial thrombosis, or Baker's Cyst.   Olin Gurski, RVS 04/10/2015, 8:20 AM

## 2015-04-10 NOTE — Evaluation (Signed)
Physical Therapy Assessment and Plan  Patient Details  Name: Spencer Fischer MRN: [id number] Date of Birth: December 28, 1965  PT Diagnosis: Difficulty walking, Edema, Impaired cognition, Impaired sensation, Muscle weakness and Osteoarthritis Rehab Potential: Good ELOS: 7-10 days   Today's Date: 04/10/2015 PT Individual Time: 1300-1430 PT Individual Time Calculation (min): 90 min    Problem List:  Patient Active Problem List   Diagnosis Date Noted  . Arthritis of both hips 04/09/2015  . Osteoarthritis of bilateral hips 04/06/2015  . 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    Past Medical History:  Past Medical History  Diagnosis Date  . Hypertension   . Carpal tunnel syndrome   . Arthritis   . Anxiety    Past Surgical History:  Past Surgical History  Procedure Laterality Date  . Knee surgery Left   . Bilateral anterior total hip arthroplasty Bilateral 04/06/2015    Procedure: BILATERAL ANTERIOR TOTAL HIP ARTHROPLASTY;  Surgeon: Mcarthur Rossetti, MD;  Location: [facility];  Service: Orthopedics;  Laterality: Bilateral;    Assessment & Plan Clinical Impression: Spencer Fischer is a 49 y.o. male with history HTN, anxiety, progressive hip pain due to OA for the past 5 years and failure of conservative therapy. His pain was 10/10 in severity and he had associated difficulties with ambulation. He failed conservative treatment and elected to undergo B-THR on 04/06/15 by Dr. Ninfa Linden. Post-op course complicated by pain, tachycardia, AKI as well as ABLA. Today, pain is 7/10, non-radiating, and improved with medications. He denies associated numbness/tingling. Therapy ongoing and CIR was recommended by MD and PT for follow up therapy.  Patient transferred to CIR on 04/09/2015 .   Patient currently requires min with mobility secondary to muscle weakness, decreased cardiorespiratoy endurance and decreased sitting balance, decreased standing balance and difficulty maintaining  precautions.  Prior to hospitalization, patient was modified independent  with mobility and lived with Family in a   home.  Home access is   (2).  Patient will benefit from skilled PT intervention to maximize safe functional mobility, minimize fall risk and decrease caregiver burden for planned discharge home with an unsure level of assist - pending d/c destination.  Anticipate patient will benefit from follow up OP at discharge.  PT - End of Session Activity Tolerance: Tolerates 10 - 20 min activity with multiple rests Endurance Deficit: Yes Endurance Deficit Description: cardiorespiratory PT Assessment Rehab Potential (ACUTE/IP ONLY): Good Barriers to Discharge: Other (comment) (patient is homeless and unsure of d/c destination) PT Patient demonstrates impairments in the following area(s): Balance;Skin Integrity;Edema;Endurance;Motor;Pain;Safety;Sensory PT Transfers Functional Problem(s): Bed Mobility;Bed to Chair;Car;Furniture PT Locomotion Functional Problem(s): Ambulation;Wheelchair Mobility;Stairs PT Plan PT Intensity: Minimum of 1-2 x/day ,45 to 90 minutes PT Frequency: 5 out of 7 days PT Duration Estimated Length of Stay: 7-10 days PT Treatment/Interventions: Ambulation/gait training;Discharge planning;Functional mobility training;Psychosocial support;Therapeutic Activities;Balance/vestibular training;Disease management/prevention;Neuromuscular re-education;Skin care/wound management;Therapeutic Exercise;Wheelchair propulsion/positioning;Cognitive remediation/compensation;DME/adaptive equipment instruction;Pain management;UE/LE Strength taining/ROM;Community reintegration;Patient/family education;Stair training;UE/LE Coordination activities PT Transfers Anticipated Outcome(s): mod I PT Locomotion Anticipated Outcome(s): mod I ambulation PT Recommendation Follow Up Recommendations: Outpatient PT Patient destination: Home (exact destination tbd) Equipment Recommended: To be  determined Equipment Details: Pt owns a SPC. Will likely req a RW and possibly a w/c, pending progress  Skilled Therapeutic Intervention Pt received in bed - agreeable to PT evaluation. PT Evaluation - pt presents s/p B THA - anterior approach and is primarily a pain dominant patient, but is willing to work hard and push himself to achieve an independent level  of functioning. Pt's B hip ROM and strength is limited, impaired by post-surgical pain, as well as activity tolerance and dynamic sitting & standing balance. Pt is unsure if he will have to complete stairs at d/c and is unsure of which type of vehicle he may get a ride home in. Therapeutic Activity - see function tab for details. Pt completes supine to/from sit bed mobility through modified long sit with assist from PT at trunk for balance. PT sets car at high height with curb height step to simulate a riser, as pt lists many high cars he may d/c home in (eg - SUV, Suburband, large trucks). W/C Management - pt currently req assist for w/c parts management and verbal cues to learn how to steer w/c with B UEs, including up/down a low ramp - see function tab for details. Gait Training - see function tab for details. Pt demonstrates a very slow gait with steppage pattern and B heel strike - at end of 37' pt demonstrates one LOB req min A to correct. Stair training - PT instructs pt on a single 3" height step with B handrails req min A. Pt tolerates ambulating over compliant mat surface with steadying assist. Therapeutic Exercise - PT instructs pt in B LE AROM exercises: heel slides, supine hip abduction/adduction, ankle pumps, and LAQ x 10 reps each. Pt agrees to practice supine hip abduction/adduction on his own. Pt is likely to benefit from iPR PT to maximize functional independence and safety. Continue per PT POC.    PT Evaluation Precautions/Restrictions Precautions Precautions: Fall Restrictions Weight Bearing Restrictions: Yes RLE Weight  Bearing: Weight bearing as tolerated LLE Weight Bearing: Weight bearing as tolerated General Chart Reviewed: Yes Family/Caregiver Present: No  Pain Pain Assessment Pain Assessment: 0-10 Pain Score: 7  Pain Type: Acute pain Pain Location: Hip Pain Orientation: Right;Left Pain Descriptors / Indicators: Aching Pain Onset: On-going Pain Intervention(s): Medication (See eMAR) Multiple Pain Sites: No Home Living/Prior Functioning Home Living Additional Comments: Pt states he is homeless and stays with friends/families  Lives With: Family Prior Function Level of Independence: Requires assistive device for independence  Able to Take Stairs?: Yes (one or two) Driving: No Vocation: Unemployed Comments: pt has been unable to work due to hip pain, 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 Vision/Perception  Perception Comments: appears wfl  Cognition Overall Cognitive Status: Within Functional Limits for tasks assessed Arousal/Alertness: Awake/alert Orientation Level: Oriented X4 Attention: Focused;Sustained Focused Attention: Appears intact Sustained Attention: Appears intact Memory: Impaired Memory Impairment: Decreased recall of new information (2/4 words recalled) Awareness: Appears intact Problem Solving: Appears intact Safety/Judgment: Appears intact Sensation Sensation Light Touch: Impaired Detail Light Touch Impaired Details: Impaired LUE;Impaired RUE Stereognosis: Not tested Hot/Cold: Not tested Proprioception: Appears Intact Additional Comments: intermittent numb/tingly in B hands and B shoulders - pt reports from carpal tunnel Coordination Fine Motor Movements are Fluid and Coordinated: Not tested Finger Nose Finger Test: wfl B LEs Heel Shin Test: NT due to hip pain Motor  Motor Motor: Within Functional Limits  Mobility Bed Mobility Bed Mobility: Rolling Right;Rolling Left;Supine to Sit Rolling Right: 4: Min  assist Rolling Right Details: Manual facilitation for weight shifting;Verbal cues for technique Rolling Left: 4: Min assist Rolling Left Details: Manual facilitation for weight shifting;Verbal cues for technique Supine to Sit: 4: Min assist Supine to Sit Details: Manual facilitation for placement;Manual facilitation for weight shifting;Verbal cues for technique Transfers Transfers: Yes Sit to Stand: 4: Min  assist Sit to Stand Details: Manual facilitation for placement Stand Pivot Transfers: 4: Min assist Stand Pivot Transfer Details: Manual facilitation for weight shifting;Verbal cues for technique;Verbal cues for safe use of DME/AE Locomotion  Ambulation Ambulation: Yes Ambulation/Gait Assistance: 4: Min assist Ambulation Distance (Feet): 90 Feet Assistive device: Rolling walker Ambulation/Gait Assistance Details: Verbal cues for technique;Manual facilitation for weight shifting;Verbal cues for safe use of DME/AE Ambulation/Gait Assistance Details: mostly CGA, but one small LOB at end req min A to correct Gait Gait: Yes Gait Pattern: Impaired Gait Pattern: Step-through pattern;Left steppage;Right steppage;Trunk flexed Gait velocity: very slow pattern Stairs / Additional Locomotion Stairs: Yes Stairs Assistance: 4: Min assist Stairs Assistance Details: Manual facilitation for placement;Verbal cues for technique Stair Management Technique: Two rails Number of Stairs: 1 Height of Stairs: 3 Curb: 4: Min assist (with RW, backwards approach) Product manager Mobility: Yes Wheelchair Assistance: 5: Investment banker, operational Details: Visual cues for safe use of DME/AE Wheelchair Propulsion: Both upper extremities Wheelchair Parts Management: Needs assistance Distance: 200'  Trunk/Postural Assessment  Cervical Assessment Cervical Assessment: Within Functional Limits Thoracic Assessment Thoracic Assessment: Within Functional Limits Lumbar Assessment Lumbar  Assessment: Within Functional Limits Postural Control Postural Control: Within Functional Limits  Balance Balance Balance Assessed: Yes Static Sitting Balance Static Sitting - Balance Support: Bilateral upper extremity supported;Feet supported Static Sitting - Level of Assistance: 5: Stand by assistance Dynamic Sitting Balance Dynamic Sitting - Balance Support: Bilateral upper extremity supported;Feet supported Dynamic Sitting - Level of Assistance: 5: Stand by assistance Static Standing Balance Static Standing - Balance Support: Bilateral upper extremity supported;During functional activity Static Standing - Level of Assistance: 4: Min assist Dynamic Standing Balance Dynamic Standing - Balance Support: Bilateral upper extremity supported;During functional activity Dynamic Standing - Level of Assistance: 4: Min assist Extremity Assessment  RUE Assessment RUE Assessment: Within Functional Limits LUE Assessment LUE Assessment: Within Functional Limits RLE Assessment RLE Assessment: Exceptions to [facility] RLE AROM (degrees) Overall AROM Right Lower Extremity: Deficits;Due to pain;Due to precautions RLE Overall AROM Comments: hip flexion limited 50%; knee wfl; ankle wfl RLE Strength RLE Overall Strength: Deficits;Due to pain RLE Overall Strength Comments: hip flexion 3-/5, hip abduction 2-/5, knee grossly 4/5; ankle DF 4/5 LLE Assessment LLE Assessment: Exceptions to WFL LLE AROM (degrees) Overall AROM Left Lower Extremity: Deficits;Due to decreased strength;Due to pain;Due to precautions LLE Overall AROM Comments: hip flexion limited 50%; knee & ankle wfl LLE Strength LLE Overall Strength: Deficits;Due to pain LLE Overall Strength Comments: hip flexion 3-/5, hip abduction 2-/5, knee grossly 4/5; ankle DF 4/5   See Function Navigator for Current Functional Status.   Refer to Care Plan for Long Term Goals  Recommendations for other services: None  Discharge Criteria: Patient  will be discharged from PT if patient refuses treatment 3 consecutive times without medical reason, if treatment goals not met, if there is a change in medical status, if patient makes no progress towards goals or if patient is discharged from hospital.  The above assessment, treatment plan, treatment alternatives and goals were discussed and mutually agreed upon: by patient  [facility] M 04/10/2015, 3:50 PM

## 2015-04-10 NOTE — Progress Notes (Signed)
Spencer Fischer PHYSICAL MEDICINE & REHABILITATION     PROGRESS NOTE    Subjective/Complaints: Pt didn'Fischer sleep well. Up most of the night per rn. Pt states that he was having a lot of spasms. Oxy seems to work fairly well for frank pain. Hasn'Fischer had bm since being in hospital.  ROS: Pt denies fever, rash/itching, headache, blurred or double vision, nausea, vomiting, abdominal pain, diarrhea, chest pain, shortness of breath, palpitations, dysuria, dizziness, neck or back pain, bleeding,  or depression   Objective: Vital Signs: Blood pressure 132/60, pulse 104, temperature 98.3 F (36.8 C), temperature source Oral, resp. rate 18, height  (1.803 m), weight 106.1 kg (233 lb 14.5 oz), SpO2 96 %. No results found.  Recent Labs  04/09/15 0501 04/10/15 0555  WBC 16.6* 14.9*  HGB 9.9* 9.7*  HCT 28.6* 28.0*  PLT 227 281    Recent Labs  04/10/15 0555  NA 129*  K 3.1*  CL 93*  GLUCOSE 150*  BUN 13  CREATININE 1.27*  CALCIUM 8.8*   CBG (last 3)  No results for input(s): GLUCAP in the last 72 hours.  Wt Readings from Last 3 Encounters:  04/09/15 106.1 kg (233 lb 14.5 oz)  04/06/15 104.781 kg (231 lb)  03/26/15 104.962 kg (231 lb 6.4 oz)    Physical Exam:  Constitutional: He is oriented to person, place, and time. He appears well-developed and well-nourished.  HENT:  Head: Normocephalic and atraumatic.  Eyes: Conjunctivae and EOM are normal.  Neck: Normal range of motion. Neck supple.  Cardiovascular: Normal rate and regular rhythm.  No murmur heard. No rubs Respiratory: Effort normal and breath sounds normal. No respiratory distress.  GI: Soft. Bowel sounds are normal. He exhibits no distension.  Musculoskeletal: He exhibits edema and tenderness.  Strength b/l UE 5/5 grossly B/l LE hip flex 3-/5 grossly (limited by pain), 5/5 ankle dorsi/plantar flexion  Neurological: He is alert and oriented to person, place, and time.  Skin: Skin is warm and dry.  Incisions  c/d/i, dressing soaked with old blood Psychiatric: He has a normal mood and affect. His behavior is normal.   Assessment/Plan: 1. Functional deficits secondary to oa hips s/p bilateral THA's which require 3+ hours per day of interdisciplinary therapy in a comprehensive inpatient rehab setting. Physiatrist is providing close team supervision and 24 hour management of active medical problems listed below. Physiatrist and rehab team continue to assess barriers to discharge/monitor patient progress toward functional and medical goals.  Function:  Bathing Bathing position      Bathing parts      Bathing assist        Upper Body Dressing/Undressing Upper body dressing                    Upper body assist        Lower Body Dressing/Undressing Lower body dressing                                  Lower body assist        Toileting Toileting Toileting activity did not occur: No continent bowel/bladder event (using urinal, No BM)        Toileting assist     Transfers Chair/bed Optician, dispensing  Cognition Comprehension Comprehension assist level: Follows basic conversation/direction with no assist  Expression Expression assist level: Expresses complex ideas: With no assist  Social Interaction Social Interaction assist level: Interacts appropriately 90% of the time - Needs monitoring or encouragement for participation or interaction., Interacts appropriately with others with medication or extra time (anti-anxiety, antidepressant).  Problem Solving Problem solving assist level: Solves basic problems with no assist  Memory Memory assist level: Complete Independence: No helper   Medical Problem List and Plan: 1. Functional deficits secondary to end stage OA s/p b/l THA 2. DVT Prophylaxis/Anticoagulation: Pharmaceutical: Lovenox. Will check doppler to rule out DVT. Resume ASA at discharge.  3.  Pain Management: oxy cr added 20mg  q12, oxy ir 20mg  q4 prn  -change robaxin to 1000mg  qid scheduled  -q2 hour ice  -sleep/anxiety rx 4. Mood: LCSW to follow for evaluation and support.  5. Neuropsych: This patient is capable of making decisions on his own behalf. 6. Skin/Wound Care: Routine pressure relief measures.  7. Fluids/Electrolytes/Nutrition: Monitor I/O. Offer supplements between meals.   -supplement potassium  -hyponatremia--may due to volume post-op, decreased po---recheck in AM 8. ABLA:   Added iron supplement. I personally reviewed the patient's labs today. hgb at 9.7  -recheck in AM 9. AKI: Push po fluids. Recheck lytes in am.  10. Leucocytosis: Monitor for signs of infection.wbc down to 14.9 today  11. Resting tachycardia: Question due to deconditioning and pain. Monitor for symptoms with activity 12. Constipation: rx   LOS (Days) 1 A FACE TO FACE EVALUATION WAS PERFORMED  Spencer Fischer 04/10/2015 8:48 AM

## 2015-04-10 NOTE — Progress Notes (Signed)
Awake all night with various complaints. PRN oxy IR given at 2210 and 0251. PRN trazodone given at 0146, not effective. LBM 10/24, per report, sorbitol given on previous unit. Scheduled miralax given. Refused dulcolax supp. Spencer MartinezMurray, [name] A

## 2015-04-11 ENCOUNTER — Inpatient Hospital Stay (HOSPITAL_COMMUNITY): Payer: Medicaid Other | Admitting: Physical Therapy

## 2015-04-11 LAB — BASIC METABOLIC PANEL
ANION GAP: 10 (ref 5–15)
BUN: 11 mg/dL (ref 6–20)
CO2: 28 mmol/L (ref 22–32)
Calcium: 9.1 mg/dL (ref 8.9–10.3)
Chloride: 92 mmol/L — ABNORMAL LOW (ref 101–111)
Creatinine, Ser: 1.04 mg/dL (ref 0.61–1.24)
GFR calc Af Amer: >60 mL/min (ref >60)
GLUCOSE: 130 mg/dL — AB (ref 65–99)
POTASSIUM: 3.3 mmol/L — AB (ref 3.5–5.1)
Sodium: 130 mmol/L — ABNORMAL LOW (ref 135–145)

## 2015-04-11 LAB — CBC
HEMATOCRIT: 28.9 % — AB (ref 39.0–52.0)
Hemoglobin: 9.7 g/dL — ABNORMAL LOW (ref 13.0–17.0)
MCH: 30.4 pg (ref 26.0–34.0)
MCHC: 33.6 g/dL (ref 30.0–36.0)
MCV: 90.6 fL (ref 78.0–100.0)
PLATELETS: 323 10*3/uL (ref 150–400)
RBC: 3.19 MIL/uL — AB (ref 4.22–5.81)
RDW: 13.9 % (ref 11.5–15.5)
WBC: 14.5 10*3/uL — ABNORMAL HIGH (ref 4.0–10.5)

## 2015-04-11 MED ORDER — SORBITOL 70 % SOLN
60.0000 mL | Status: AC
  Administered 2015-04-11: 60 mL via ORAL
  Filled 2015-04-11: qty 60

## 2015-04-11 NOTE — Progress Notes (Signed)
[facility] PHYSICAL MEDICINE & REHABILITATION     PROGRESS NOTE    Subjective/Complaints: Slept better last night although he feels that eating  [employer] might have disturbed his sleep. His girlfriend apparently created a bit of a disturbance as well.  ROS: Pt denies fever, rash/itching, headache, blurred or double vision, nausea, vomiting, abdominal pain, diarrhea, chest pain, shortness of breath, palpitations, dysuria, dizziness, neck or back pain, bleeding,  or depression   Objective: Vital Signs: Blood pressure 154/86, pulse 101, temperature 98.5 F (36.9 C), temperature source Oral, resp. rate 20, height 5\' 11"  (1.803 m), weight 106.1 kg (233 lb 14.5 oz), SpO2 100 %. No results found.  Recent Labs  04/10/15 0555 04/11/15 0515  WBC 14.9* 14.5*  HGB 9.7* 9.7*  HCT 28.0* 28.9*  PLT 281 323    Recent Labs  04/10/15 0555 04/11/15 0515  NA 129* 130*  K 3.1* 3.3*  CL 93* 92*  GLUCOSE 150* 130*  BUN 13 11  CREATININE 1.27* 1.04  CALCIUM 8.8* 9.1   CBG (last 3)  No results for input(s): GLUCAP in the last 72 hours.  Wt Readings from Last 3 Encounters:  04/09/15 106.1 kg (233 lb 14.5 oz)  04/06/15 104.781 kg (231 lb)  03/26/15 104.962 kg (231 lb 6.4 oz)    Physical Exam:  Constitutional: He is oriented to person, place, and time. He appears well-developed and well-nourished.  HENT:  Head: Normocephalic and atraumatic.  Eyes: Conjunctivae and EOM are normal.  Neck: Normal range of motion. Neck supple.  Cardiovascular: Normal rate and regular rhythm.  No murmur heard. No rubs Respiratory: Effort normal and breath sounds normal. No respiratory distress.  GI: Soft. Bowel sounds are normal. He exhibits no distension.  Musculoskeletal: He exhibits edema and tenderness.  Strength b/l UE 5/5 grossly B/l LE hip flex 3-/5 grossly (limited by pain), 5/5 ankle dorsi/plantar flexion  Neurological: He is alert and oriented to person, place, and time.  Skin: Skin is warm  and dry.  Incisions clean, mild drainage Psychiatric: He has a normal mood and affect. His behavior is normal.   Assessment/Plan: 1. Functional deficits secondary to oa hips s/p bilateral THA's which require 3+ hours per day of interdisciplinary therapy in a comprehensive inpatient rehab setting. Physiatrist is providing close team supervision and 24 hour management of active medical problems listed below. Physiatrist and rehab team continue to assess barriers to discharge/monitor patient progress toward functional and medical goals.  Function:  Bathing Bathing position   Position: Sitting EOB  Bathing parts   Body parts bathed by helper: Right arm, Left arm, Chest, Abdomen, Front perineal area, Buttocks, Right upper leg, Left upper leg  Bathing assist Assist Level: Touching or steadying assistance(Pt > 75%)      Upper Body Dressing/Undressing Upper body dressing   What is the patient wearing?: Pull over shirt/dress     Pull over shirt/dress - Perfomed by patient: Thread/unthread right sleeve, Thread/unthread left sleeve, Put head through opening, Pull shirt over trunk          Upper body assist Assist Level: Supervision or verbal cues, Set up      Lower Body Dressing/Undressing Lower body dressing   What is the patient wearing?: Non-skid slipper socks, Pants     Pants- Performed by patient: Pull pants up/down Pants- Performed by helper: Thread/unthread right pants leg, Thread/unthread left pants leg   Non-skid slipper socks- Performed by helper: Don/doff right sock, Don/doff left sock  Lower body assist Assist for lower body dressing: Touching or steadying assistance (Pt > 75%)      Toileting Toileting Toileting activity did not occur: No continent bowel/bladder event Toileting steps completed by patient: Performs perineal hygiene, Adjust clothing prior to toileting Toileting steps completed by helper: Adjust clothing after toileting Toileting  Assistive Devices: Grab bar or rail  Toileting assist Assist level: Touching or steadying assistance (Pt.75%)   Transfers Chair/bed transfer   Chair/bed transfer method: Ambulatory Chair/bed transfer assist level: Touching or steadying assistance (Pt > 75%) Chair/bed transfer assistive device: Armrests, Patent attorney     Max distance: 38' Assist level: Touching or steadying assistance (Pt > 75%)   Wheelchair   Type: Manual Max wheelchair distance: 200' Assist Level: Supervision or verbal cues  Cognition Comprehension Comprehension assist level: Understands complex 90% of the time/cues 10% of the time  Expression Expression assist level: Expresses complex ideas: With no assist  Social Interaction Social Interaction assist level: Interacts appropriately 90% of the time - Needs monitoring or encouragement for participation or interaction.  Problem Solving Problem solving assist level: Solves basic problems with no assist  Memory Memory assist level: Recognizes or recalls 50 - 74% of the time/requires cueing 25 - 49% of the time   Medical Problem List and Plan: 1. Functional deficits secondary to end stage OA s/p b/l THA 2. DVT Prophylaxis/Anticoagulation: Pharmaceutical: Lovenox. Dopplers negative.  - Resume ASA at discharge.  3. Pain Management: oxy cr added  q12, oxy ir  q4 prn  -changed robaxin to  qid scheduled  -q2 hour ice  -sleep/anxiety rx 4. Mood: LCSW to follow for evaluation and support.  5. Neuropsych: This patient is capable of making decisions on his own behalf. 6. Skin/Wound Care: foam dressings to hip incisions---may need something more absorptive 7. Fluids/Electrolytes/Nutrition: Monitor I/O. Offer supplements between meals.   -continue to supplement potassium  -hyponatremia--I personally reviewed the patient's labs today. Improved to 130 today 8. ABLA:   Added iron supplement. I personally reviewed the patient's labs  today. hgb at 9.7  -recheck in AM 9. AKI: Push po fluids. improved  10. Leucocytosis: Monitor for signs of infection.wbc down to 14.9 today  11. Resting tachycardia: Question due to deconditioning and pain. Monitor for symptoms with activity 12. Constipation: sorbitol today---Supp/SSE if no BM by pm  LOS (Days) 2 A FACE TO FACE EVALUATION WAS PERFORMED  Guida Asman T 04/11/2015 9:16 AM

## 2015-04-11 NOTE — Progress Notes (Signed)
Physical Therapy Session Note  Patient Details  Name: Spencer Fischer MRN: [id number] Date of Birth: 03/15/1966  Today's Date: 04/11/2015 PT Individual Time: [id number] PT Individual Time Calculation (min): 60 min   Short Term Goals: Week 1:  PT Short Term Goal 1 (Week 1): STGs = LTGs due to ELOS  Skilled Therapeutic Interventions/Progress Updates:  Pt was seen bedside in the am. Pt transferred supine to edge of bed with head of bed elevated, side rails and S. Pt transferred sit to stand with rolling walker and S. Pt ambulated about 50 feet with rolling walker and S. Pt propelled w/c about 50 feet with B UEs and S. Pt transferred w/c to edge of mat with S and rolling walker. Pt transferred edge of mat to supine with min A and verbal cues. While supine on mat, pt performed heel slides, hip abd/add, SAQs, and SLRs, AROM to AAROM, 3 sets x 10 reps each. Pt transferred supine to edge of mat with S and increased time. Pt transferred sit to stand with S and rolling walker. Pt ambulated about 75 feet with rolling walker and S. Pt returned to room. Pt transferred w/c to recliner with rolling walker and S. Pt left sitting up with family at bedside.    Therapy Documentation Precautions:  Precautions Precautions: Fall Restrictions Weight Bearing Restrictions: Yes RLE Weight Bearing: Weight bearing as tolerated LLE Weight Bearing: Weight bearing as tolerated General:    Pain: Pt c/o 8/10 pain B hips, medicated at beginning of therapy.   See Function Navigator for Current Functional Status.   Therapy/Group: Individual Therapy  Spencer Fischer, Spencer Fischer 04/11/2015, 12:46 PM

## 2015-04-11 NOTE — Progress Notes (Signed)
+/-   sleep. PRN Oxy Ir 20mg  given at 2129, 0135, & 0535. PRN ultram given at 2043. Alfredo MartinezMurray, Joanne Brander A

## 2015-04-12 ENCOUNTER — Inpatient Hospital Stay (HOSPITAL_COMMUNITY): Payer: Medicaid Other

## 2015-04-12 ENCOUNTER — Inpatient Hospital Stay (HOSPITAL_COMMUNITY): Payer: No Typology Code available for payment source | Admitting: Physical Therapy

## 2015-04-12 ENCOUNTER — Inpatient Hospital Stay (HOSPITAL_COMMUNITY): Payer: Medicaid Other | Admitting: Physical Therapy

## 2015-04-12 MED ORDER — MENTHOL 3 MG MT LOZG: 1.0000 | LOZENGE | OROMUCOSAL | Status: DC | PRN

## 2015-04-12 MED ORDER — FAMOTIDINE 20 MG PO TABS
20.0000 mg | ORAL_TABLET | Freq: Two times a day (BID) | ORAL | Status: DC
  Administered 2015-04-12 – 2015-04-20 (×16): 20 mg via ORAL
  Filled 2015-04-12: qty 1
  Filled 2015-04-12: qty 2
  Filled 2015-04-12 (×14): qty 1

## 2015-04-12 NOTE — IPOC Note (Addendum)
.  Overall Plan of Care [facility](IPOC) Patient Details Name: Spencer Fischer MRN: [id number] DOB: 02/29/1966  Admitting Diagnosis: [facility]  Hospital Problems: Active Problems:   Arthritis of both hips     Functional Problem List: Nursing Bladder, Bowel, Edema, Endurance, Medication Management, Pain, Safety, Sensory, Skin Integrity  PT Balance, Skin Integrity, Edema, Endurance, Motor, Pain, Safety, Sensory  OT Balance, Endurance, Motor, Pain, Safety  SLP    TR         Basic ADL's: OT Grooming, Bathing, Dressing, Toileting     Advanced  ADL's: OT Simple Meal Preparation, Laundry, Light Housekeeping     Transfers: PT Bed Mobility, Bed to Chair, Car, Occupational psychologisturniture  OT Toilet, Research scientist (life sciences)Tub/Shower     Locomotion: PT Ambulation, Psychologist, prison and probation servicesWheelchair Mobility, Stairs     Additional Impairments: OT None  SLP        TR      Anticipated Outcomes Item Anticipated Outcome  Self Feeding independent  Swallowing      Basic self-care  mod I  Toileting  mod I for toilet    Bathroom Transfers mod I for toilet and SBA for shower  Bowel/Bladder  min assist  Transfers  mod I  Locomotion  mod I ambulation  Communication     Cognition     Pain  <3 on a 0-10 pain scale  Safety/Judgment  min assist   Therapy Plan: PT Intensity: Minimum of 1-2 x/day ,45 to 90 minutes PT Frequency: 5 out of 7 days PT Duration Estimated Length of Stay: 7-10 days OT Intensity: Minimum of 1-2 x/day, 45 to 90 minutes OT Frequency: 5 out of 7 days OT Duration/Estimated Length of Stay: 7-10 days         Team Interventions: Nursing Interventions Patient/Family Education, Bladder Management, Bowel Management, Disease Management/Prevention, Pain Management, Medication Management, Skin Care/Wound Management, Discharge Planning, Psychosocial Support  PT interventions Ambulation/gait training, Discharge planning, Functional mobility training, Psychosocial support, Therapeutic Activities, Balance/vestibular training, Disease  management/prevention, Neuromuscular re-education, Skin care/wound management, Therapeutic Exercise, Wheelchair propulsion/positioning, Cognitive remediation/compensation, DME/adaptive equipment instruction, Pain management, UE/LE Strength taining/ROM, Community reintegration, Equities traderatient/family education, Museum/gallery curatortair training, UE/LE Coordination activities  OT Interventions Pain management, Neuromuscular re-education, Functional mobility training, DME/adaptive equipment instruction, Discharge planning, Warden/rangerBalance/vestibular training, Patient/family education, Self Care/advanced ADL retraining  SLP Interventions    TR Interventions    SW/CM Interventions  discharge planning, psychosocial assessment,pt/family education    Team Discharge Planning: Destination: PT-Home (exact destination tbd) ,OT- Home , SLP-  Projected Follow-up: PT-Outpatient PT, OT-  Home health OT, SLP-  Projected Equipment Needs: PT-To be determined, OT- 3 in 1 bedside comode, Tub/shower seat, SLP-  Equipment Details: PT-Pt owns a SPC. Will likely req a RW and possibly a w/c, pending progress, OT-  Patient/family involved in discharge planning: PT- Patient,  OT-Patient, SLP-   MD ELOS: 7-10d Medical Rehab Prognosis:  Good Assessment: 49 y.o. male with history HTN, anxiety, progressive hip pain due to OA for the past 5 years and failure of conservative therapy. His pain was 10/10 in severity and he had associated difficulties with ambulation. He failed conservative treatment and elected to undergo B-THR on 04/06/15 by Dr. Magnus IvanBlackman. Post-op course complicated by pain, tachycardia, AKI as well as ABLA.   Now requiring 24/7 Rehab RN,MD, as well as CIR level PT, OT .  Treatment team will focus on ADLs and mobility with goals set at Mod I See Team Conference Notes for weekly updates to the plan of care

## 2015-04-12 NOTE — Progress Notes (Signed)
Physical Therapy Note  Patient Details  Name: Graylon GoodKevin L Hagwood MRN: [id number] Date of Birth: 06/30/1965 Today's Date: 04/12/2015    Time: 1430-1500 30 minutes  1:1 No c/o pain, c/o "soreness" from morning sessions.  Pt performed bed mobility with railings with mod I.  Gait 150',  100' x 2 with RW supervision, decreased cadence, small stride length.  Seated therex 2 x 20 LAQ, hip add, ankle PF/DF. Pt with no c/o increased pain during session.   DONAWERTH,KAREN 04/12/2015, 3:02 PM

## 2015-04-12 NOTE — Progress Notes (Signed)
[facility] PHYSICAL MEDICINE & REHABILITATION     PROGRESS NOTE    Subjective/Complaints: Sister who is Nurse tech in room, pain is fairly well controlled per pt Ordered cheese burger and Chickn fried steak, no fruits or veggies.  Discussed need for fiber to combat constipation  ROS: Pt denies fever, rash/itching, headache, blurred or double vision, nausea, vomiting, abdominal pain, diarrhea, chest pain, shortness of breath, palpitations, dysuria, dizziness,    Objective: Vital Signs: Blood pressure 152/74, pulse 104, temperature 99.1 F (37.3 C), temperature source Oral, resp. rate 18, height 5\' 11"  (1.803 m), weight 106.1 kg (233 lb 14.5 oz), SpO2 95 %. No results found.  Recent Labs  04/10/15 0555 04/11/15 0515  WBC 14.9* 14.5*  HGB 9.7* 9.7*  HCT 28.0* 28.9*  PLT 281 323    Recent Labs  04/10/15 0555 04/11/15 0515  NA 129* 130*  K 3.1* 3.3*  CL 93* 92*  GLUCOSE 150* 130*  BUN 13 11  CREATININE 1.27* 1.04  CALCIUM 8.8* 9.1   CBG (last 3)  No results for input(s): GLUCAP in the last 72 hours.  Wt Readings from Last 3 Encounters:  04/09/15 106.1 kg (233 lb 14.5 oz)  04/06/15 104.781 kg (231 lb)  03/26/15 104.962 kg (231 lb 6.4 oz)    Physical Exam:  Constitutional: He is oriented to person, place, and time. He appears well-developed and well-nourished.  HENT:  Head: Normocephalic and atraumatic.  Eyes: Conjunctivae and EOM are normal.  Neck: Normal range of motion. Neck supple.  Cardiovascular: Normal rate and regular rhythm.  No murmur heard. No rubs Respiratory: Effort normal and breath sounds normal. No respiratory distress.  GI: Soft. Bowel sounds are normal. He exhibits no distension.  Musculoskeletal: He exhibits edema and tenderness.  Strength b/l UE 5/5 grossly B/l LE hip flex 3-/5 grossly (limited by pain), 5/5 ankle dorsi/plantar flexion  Neurological: He is alert and oriented to person, place, and time.  Skin: Skin is warm and dry.   Incisions clean,no drainage Psychiatric: He has a normal mood and affect. His behavior is normal.   Assessment/Plan: 1. Functional deficits secondary to oa hips s/p bilateral THA's which require 3+ hours per day of interdisciplinary therapy in a comprehensive inpatient rehab setting. Physiatrist is providing close team supervision and 24 hour management of active medical problems listed below. Physiatrist and rehab team continue to assess barriers to discharge/monitor patient progress toward functional and medical goals.  Function:  Bathing Bathing position   Position: Sitting EOB  Bathing parts   Body parts bathed by helper: Right arm, Left arm, Chest, Abdomen, Front perineal area, Buttocks, Right upper leg, Left upper leg  Bathing assist Assist Level: Touching or steadying assistance(Pt > 75%)      Upper Body Dressing/Undressing Upper body dressing   What is the patient wearing?: Pull over shirt/dress     Pull over shirt/dress - Perfomed by patient: Thread/unthread right sleeve, Thread/unthread left sleeve, Put head through opening, Pull shirt over trunk          Upper body assist Assist Level: Supervision or verbal cues, Set up      Lower Body Dressing/Undressing Lower body dressing   What is the patient wearing?: Non-skid slipper socks, Pants     Pants- Performed by patient: Pull pants up/down Pants- Performed by helper: Thread/unthread right pants leg, Thread/unthread left pants leg   Non-skid slipper socks- Performed by helper: Don/doff right sock, Don/doff left sock  Lower body assist Assist for lower body dressing: Touching or steadying assistance (Pt > 75%)      Toileting Toileting Toileting activity did not occur: No continent bowel/bladder event Toileting steps completed by patient: Performs perineal hygiene, Adjust clothing prior to toileting Toileting steps completed by helper: Adjust clothing after toileting Toileting Assistive  Devices: Grab bar or rail  Toileting assist Assist level: Touching or steadying assistance (Pt.75%)   Transfers Chair/bed transfer   Chair/bed transfer method: Ambulatory Chair/bed transfer assist level: Supervision or verbal cues Chair/bed transfer assistive device: Armrests, Patent attorney     Max distance: 75 Assist level: Supervision or verbal cues   Wheelchair   Type: Manual Max wheelchair distance: 50 Assist Level: Supervision or verbal cues  Cognition Comprehension Comprehension assist level: Understands complex 90% of the time/cues 10% of the time  Expression Expression assist level: Expresses complex ideas: With no assist  Social Interaction Social Interaction assist level: Interacts appropriately with others - No medications needed.  Problem Solving Problem solving assist level: Solves basic problems with no assist  Memory Memory assist level: Recognizes or recalls 50 - 74% of the time/requires cueing 25 - 49% of the time   Medical Problem List and Plan: 1. Functional deficits secondary to end stage OA s/p b/l THA 2. DVT Prophylaxis/Anticoagulation: Pharmaceutical: Lovenox. Dopplers negative.  - Resume ASA at discharge.  3. Pain Management: oxy cr added  q12, oxy ir  q4 prn  -changed robaxin to  qid scheduled  -q2 hour ice  -sleep/anxiety rx 4. Mood: LCSW to follow for evaluation and support.  5. Neuropsych: This patient is capable of making decisions on his own behalf. 6. Skin/Wound Care: foam dressings to hip incisions---may need something more absorptive 7. Fluids/Electrolytes/Nutrition: Monitor I/O. Offer supplements between meals.   -continue to supplement potassium  -hyponatremia--I personally reviewed the patient's labs today. Improved to 130 today 8. ABLA:   Added iron supplement. I personally reviewed the patient's labs today. hgb at 9.7  -recheck in AM 9. AKI: Push po fluids. improved  10. Leucocytosis: Monitor  for signs of infection.wbc down to 14.9 today  11. Resting tachycardia: Question due to deconditioning and pain. Monitor for symptoms with activity 12. Constipation: sorbitol today---2 BM on 10/30  LOS (Days) 3 A FACE TO FACE EVALUATION WAS PERFORMED  KIRSTEINS,ANDREW E 04/12/2015 8:02 AM

## 2015-04-12 NOTE — Plan of Care (Signed)
Problem: RH PAIN MANAGEMENT Goal: RH STG PAIN MANAGED AT OR BELOW PT'S PAIN GOAL <4 on a 0-10 scale  Outcome: Not Progressing Consistently rating pain 7 or greater on pain scale.

## 2015-04-12 NOTE — Progress Notes (Signed)
Social Work  Social Work Assessment and Plan  Patient Details  Name: Spencer Fischer: [id number] Date of Birth: 01/16/1966  Today's Date: 04/12/2015  Problem List:  Patient Active Problem List   Diagnosis Date Noted  . Arthritis of both hips 04/09/2015  . Osteoarthritis of bilateral hips 04/06/2015  . 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   Past Medical History:  Past Medical History  Diagnosis Date  . Hypertension   . Carpal tunnel syndrome   . Arthritis   . Anxiety    Past Surgical History:  Past Surgical History  Procedure Laterality Date  . Knee surgery Left   . Bilateral anterior total hip arthroplasty Bilateral 04/06/2015    Procedure: BILATERAL ANTERIOR TOTAL HIP ARTHROPLASTY;  Surgeon: Kathryne Hitchhristopher Y Blackman, MD;  Location: [facility] OR;  Service: Orthopedics;  Laterality: Bilateral;   Social History:  reports that he has been smoking Cigarettes.  He has been smoking about 0.25 packs per day. He has never used smokeless tobacco. He reports that he drinks about 3.6 oz of alcohol per week. He reports that he does not use illicit drugs.  Family / Support Systems Marital Status: Single Patient Roles: Other (Comment) (Son and sibling) Other Supports: Katyh Stanton-Mom  [phone]   Mia Stanton-Sister  [phone] Anticipated Caregiver: Self Ability/Limitations of Caregiver: Sister works and Mom has health issues Caregiver Availability: Intermittent Family Dynamics: Pt has a good relationship with his Mom but doesn't want to sty on her couch. His sister has not offered for him to come stay with her. Pt states: " We have issues I don't want to go into that right now."  He wants to go somewhere for 3-4 months until he is better.  Social History Preferred language: English Religion: Non-Denominational Cultural Background: No issues Education: High School Read: Yes Write: Yes Employment Status: Disabled Date Retired/Disabled/Unemployed: Has lawyer working on  his disability Administratorapproval Legal Hisotry/Current Legal Issues: No issues Guardian/Conservator: Noe-according to MD pt is capable of making his own decisions while here   Abuse/Neglect Physical Abuse: Denies Verbal Abuse: Denies Sexual Abuse: Denies Exploitation of patient/patient's resources: Denies Self-Neglect: Denies  Emotional Status Pt's affect, behavior adn adjustment status: Pt is motivated to recover but wants many things-ie housing, disability pushed through and to not leave too soon from here. He will do what the team recommends and work hard while here. Discussed option of going to a RH after here and he was interested in this. Recent Psychosocial Issues: housing issues and financial issues Pyschiatric History: History of anxiety takes medinces for and feels they are helpful and plans to continue taking them.  Would benefit from neuro-psych to see while here. He appears to be coping well and is liking it here. Substance Abuse History: Tobacco and ETOH he is aware of the health risks of both but feel it is just for social reasons. He feels he can quit at any time.  Patient / Family Perceptions, Expectations & Goals Pt/Family understanding of illness & functional limitations: Pt is able to explain his hip replacments, he has been dealing with them for the past five years. He is glad it is over and ready to work on healing and recovering now. He talks with the MD's daily and feels his questions and concerns are being addressed. Premorbid pt/family roles/activities: Son, Brother, friend, etc Anticipated changes in roles/activities/participation: resume Pt/family expectations/goals: Pt states: " I want to sty to get walking then go someplace else."    [employer] Community Agencies: Other (  Comment) Psychiatric nurse working on disability claim) Premorbid Home Care/DME Agencies: None Transportation available at discharge: Family Resource referrals recommended: Neuropsychology  Discharge  Planning Living Arrangements: Parent Support Systems: Parent, Other relatives, Friends/neighbors Type of Residence: Private residence Insurance Resources: Medicaid (specify county) Medical sales representative Co) Surveyor, quantity Resources: Family Support Financial Screen Referred: Previously completed Living Expenses: Lives with family Money Management: Family Does the patient have any problems obtaining your medications?: No Home Management: He helped Mom but was limited due to hip issues Patient/Family Preliminary Plans: Pt doesn't want to go back to Mom's apartment due to he has to sleep on the couch-one bedroom apartment. His sister has not offered for him to stay at her home, pt assumed he could stay there. he wants to go to a RH from here for three months to recover. Will look into his Medicaid coverage and see if there is coverag e for this.  Social Work Anticipated Follow Up Needs: HH/OP, ALF/IL  Clinical Impression Pleasant gentleman who knows what he wants and demands it. He should do well here and be at a mod/i level by discharge, his only option would be to go to a rest home or family care home if his Medicaid will cover this. He does not want to go to Mom's apartment due to stays on the couch. Have left message for his Mom, awaiting return call. Team feels he will be here 7-10 days, will see if Medicaid will cover [facility] stay. Will have neuro-psych see while here, feel pt would benefit from this.  Lucy Chris 04/12/2015, 1:14 PM

## 2015-04-12 NOTE — Progress Notes (Signed)
Trish Mage, RN Rehab Admission Coordinator Signed Physical Medicine and Rehabilitation PMR Pre-admission 04/09/2015 10:46 AM  Related encounter: Admission (Discharged) from 04/06/2015 in [facility]    Expand All Collapse All   PMR Admission Coordinator Pre-Admission Assessment  Patient: Spencer Fischer is an 49 y.o., male MRN: [id number] DOB: 08/25/1965 Height: 5\' 11"  (180.3 cm) Weight: 104.781 kg (231 lb)  Insurance Information HMO: PPO: PCP: IPA: 80/20: OTHER:  PRIMARY: Medicaid of [place] Policy#: [id number] Subscriber: Juanita Craver CM Name: Phone#: Fax#:  Pre-Cert#: Employer: Unemployed Benefits: Phone #: [phone] Name: Automated Eff. Date: Eligible 04/09/15 Deduct: Out of Pocket Max: Life Max:  CIR: SNF:  Outpatient: Co-Pay:  Home Health: Co-Pay:  DME: Co-Pay:  Providers:   Medicaid Application Date: Case Manager:  Disability Application Date: Case Worker:   Emergency Contact Information Contact Information    Name Relation Home Work Mobile   [place] Mother [phone]     Vivianne Master [phone]       Current Medical History  Patient Admitting Diagnosis: B THR  History of Present Illness:A 49 y.o. male with history HTN, anxiety, progressive hip pain due to OA for the past 5 years and failure of conservative therapy. His pain was 10/10 in severity and he had associated difficulties with ambulation. He failed conservative treatment and elected to undergo B-THR on 04/06/15 by Dr. Magnus Ivan. Post-op course complicated by pain, tachycardia, AKI as well as ABLA. Therapy ongoing and CIR was recommended by MD and PT for follow up therapy.     Past Medical History  Past Medical History  Diagnosis Date  . Hypertension   . Carpal tunnel syndrome   . Arthritis   . Anxiety     Family History  family history is not on file.  Prior Rehab/Hospitalizations: No previous rehab admissions  Has the patient had major surgery during 100 days prior to admission? No  Current Medications   Current facility-administered medications:  . acetaminophen (TYLENOL) tablet 650 mg, 650 mg, Oral, Q6H PRN **OR** acetaminophen (TYLENOL) suppository 650 mg, 650 mg, Rectal, Q6H PRN, Kathryne Hitch, MD . alum & mag hydroxide-simeth (MAALOX/MYLANTA) 200-200-20 MG/5ML suspension 30 mL, 30 mL, Oral, Q4H PRN, Kathryne Hitch, MD . aspirin EC tablet 325 mg, 325 mg, Oral, BID PC, Kathryne Hitch, MD, 325 mg at 04/09/15 0102 . diphenhydrAMINE (BENADRYL) 12.5 MG/5ML elixir 12.5-25 mg, 12.5-25 mg, Oral, Q4H PRN, Kathryne Hitch, MD . docusate sodium (COLACE) capsule 100 mg, 100 mg, Oral, BID, Kathryne Hitch, MD, 100 mg at 04/09/15 7253 . HYDROmorphone (DILAUDID) injection 1 mg, 1 mg, Intravenous, Q2H PRN, Kathryne Hitch, MD . menthol-cetylpyridinium (CEPACOL) lozenge 3 mg, 1 lozenge, Oral, PRN **OR** phenol (CHLORASEPTIC) mouth spray 1 spray, 1 spray, Mouth/Throat, PRN, Kathryne Hitch, MD . methocarbamol (ROBAXIN) tablet 500 mg, 500 mg, Oral, Q6H PRN, 500 mg at 04/09/15 0556 **OR** methocarbamol (ROBAXIN) 500 mg in dextrose 5 % 50 mL IVPB, 500 mg, Intravenous, Q6H PRN, Kathryne Hitch, MD . metoCLOPramide (REGLAN) tablet 5-10 mg, 5-10 mg, Oral, Q8H PRN **OR** metoCLOPramide (REGLAN) injection 5-10 mg, 5-10 mg, Intravenous, Q8H PRN, Kathryne Hitch, MD . ondansetron [facility]) tablet 4 mg, 4 mg, Oral, Q6H PRN **OR** ondansetron (ZOFRAN) injection 4 mg, 4 mg, Intravenous, Q6H PRN, Kathryne Hitch, MD . oxyCODONE (Oxy IR/ROXICODONE) immediate release tablet 5-15 mg,  5-15 mg, Oral, Q3H PRN, Kathryne Hitch, MD, 15 mg at 04/09/15 0924 . polyethylene glycol (MIRALAX / GLYCOLAX)  packet 17 g, 17 g, Oral, Daily PRN, Kathryne Hitchhristopher Y Blackman, MD . ropivacaine (PF) 2 mg/ml (0.2%) (NAROPIN) epidural, 5 mL/hr, Epidural, Continuous, Val Eaglehristopher Moser, MD, Last Rate: 5 mL/hr at 04/06/15 1728, 5 mL/hr at 04/06/15 1728 . triamterene-hydrochlorothiazide (MAXZIDE-25) 37.5-25 MG per tablet 1 tablet, 1 tablet, Oral, Daily, Kathryne Hitchhristopher Y Blackman, MD, 1 tablet at 04/09/15 [id number] . zolpidem (AMBIEN) tablet 5 mg, 5 mg, Oral, QHS PRN,MR X 1, Kathryne Hitchhristopher Y Blackman, MD  Patients Current Diet: Diet regular Room service appropriate?: Yes; Fluid consistency:: Thin  Precautions / Restrictions Precautions Precautions: Fall Restrictions Weight Bearing Restrictions: Yes RLE Weight Bearing: Weight bearing as tolerated LLE Weight Bearing: Weight bearing as tolerated   Has the patient had 2 or more falls or a fall with injury in the past year?No  Prior Activity Level Community (5-7x/wk): Went out daily. Took the bus. Out of work for the past 2 yrs and looking for a job.  Home Assistive Devices / Equipment Home Assistive Devices/Equipment: Eyeglasses  Prior Device Use: Indicate devices/aids used by the patient prior to current illness, exacerbation or injury? used a stick or a cane.  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 Needed some help with his socks and his pants. Needed help at times off and on the toilet.  Indoor Mobility: Did the patient need assistance with walking from room to room (with or without device)? Independent  Stairs: Did the patient need assistance with internal or external stairs (with  or without device)? Needed some help  Functional Cognition: Did the patient need help planning regular tasks such as shopping or remembering to take medications? Independent  Current Functional Level Cognition  Overall Cognitive Status: Within Functional Limits for tasks assessed Orientation Level: Oriented X4   Extremity Assessment (includes Sensation/Coordination)  Upper Extremity Assessment: Overall WFL for tasks assessed  Lower Extremity Assessment: Defer to PT evaluation RLE Deficits / Details: pt still with epidural so unable to accurately assess strength at this point. 3/5 knee extension and 2/5 hip flex witnessed with mobility RLE Sensation: decreased light touch, decreased proprioception RLE Coordination: decreased gross motor LLE Deficits / Details: same as RLE LLE Sensation: decreased light touch, decreased proprioception LLE Coordination: decreased gross motor    ADLs  Overall ADL's : Needs assistance/impaired Grooming: Set up Upper Body Bathing: Set up, Sitting Lower Body Bathing: Sit to/from stand, Maximal assistance Upper Body Dressing : Set up, Sitting Lower Body Dressing: Maximal assistance, Sit to/from stand Toilet Transfer: Minimal assistance, RW, Ambulation Toileting- Clothing Manipulation and Hygiene: Minimal assistance, Sit to/from stand Functional mobility during ADLs: Minimal assistance, Rolling walker, Cueing for safety, Cueing for sequencing General ADL Comments: Pt will benefit from AE for LB ADL. Able to ambulate @ 15 ft before requiring a break. Began education on availability of AE. Pt states he is homeless but that the friend that he will live with after D/C has a tub/shower and bathroom is RW accessible.     Mobility  Overal bed mobility: Needs Assistance Bed Mobility: Supine to Sit Supine to sit: Mod assist, HOB elevated Sit to supine: Min assist General bed mobility comments: pt OOB in chair    Transfers  Overall transfer level:  Needs assistance Equipment used: Rolling walker (2 wheeled) Transfers: Sit  to/from Stand Sit to Stand: Min assist Stand pivot transfers: From elevated surface General transfer comment: from recliner    Ambulation / Gait / Stairs / Psychologist, prison and probation services  Ambulation/Gait Ambulation/Gait assistance: Hydrographic surveyor (Feet): 55 Feet Assistive device: Rolling walker (2 wheeled) Gait Pattern/deviations: Decreased step length - right, Decreased step length - left General Gait Details: slow pattern, noted instability but no gross loss of balance. Patient unable to take full strides with either LE.  Gait velocity: very slow pattern Gait velocity interpretation: <1.8 ft/sec, indicative of risk for recurrent falls    Posture / Balance Balance Overall balance assessment: Needs assistance Sitting-balance support: No upper extremity supported Sitting balance-Leahy Scale: Good Standing balance support: Bilateral upper extremity supported Standing balance-Leahy Scale: Poor Standing balance comment: heavy reliance on RW. [place] to release to complete pericare    Special needs/care consideration BiPAP/CPAP No CPM No Continuous Drip IV No Dialysis No  Life Vest No Oxygen No Special Bed No Trach Size No Wound Vac (area) No  Skin Has new bilateral hip post op incisions with dressings in place  Bowel mgmt: No BM since 04/05/15 Bladder mgmt: Voiding in urinal with assistance Diabetic mgmt: No    Previous Home Environment Living Arrangements: Other relatives, Non-relatives/Friends Home Care Services: No Additional Comments: Pt states he is homeless adn stays with friends/families  Discharge Living Setting Plans for Discharge Living Setting: House, Other (Comment) (Plans to go home with sister so he will have a bed.) Type of Home at Discharge: House Discharge Home Layout: One level Discharge Home Access: Stairs to enter Entrance  Stairs-Number of Steps: 2 onto porch and 1 into house Does the patient have any problems obtaining your medications?: No  Social/Family/Support Systems Patient Roles: Other (Comment) (Has a mom and a sister.) Contact Information: Seleta Rhymes - mother [phone] Anticipated Caregiver: self and sister Anticipated Caregiver's Contact Information: Burman Blacksmith - sister [phone] Ability/Limitations of Caregiver: Sister works, but has flexible hours Caregiver Availability: Intermittent Discharge Plan Discussed with Primary Caregiver: Yes Is Caregiver In Agreement with Plan?: Yes Does Caregiver/Family have Issues with Lodging/Transportation while Pt is in Rehab?: No  Goals/Additional Needs Patient/Family Goal for Rehab: PT/OT mod I and supervision goals Expected length of stay: 7-10 days Cultural Considerations: None Dietary Needs: Regular diet, thin liquids Equipment Needs: TBD Pt/Family Agrees to Admission and willing to participate: Yes Program Orientation Provided & Reviewed with Pt/Caregiver Including Roles & Responsibilities: Yes  Decrease burden of Care through IP rehab admission: N/A  Possible need for SNF placement upon discharge: Not planned  Patient Condition: This patient's medical and functional status has changed since the consult dated: 04/07/15 in which the Rehabilitation Physician determined and documented that the patient's condition is appropriate for intensive rehabilitative care in an inpatient rehabilitation facility. See "History of Present Illness" (above) for medical update. Functional changes are: Currently requiring mod assist for bed mobility and min guard to ambulate 55 ft with RW. Patient's medical and functional status update has been discussed with the Rehabilitation physician and patient remains appropriate for inpatient rehabilitation. Will admit to inpatient rehab today.  Preadmission Screen Completed By: Trish Mage, 04/09/2015 10:56  AM ______________________________________________________________________  Discussed status with Dr. Allena Katz on 04/09/15 at 1055 and received telephone approval for admission today.  Admission Coordinator: Trish Mage, time1055/Date10/28/16          Cosigned by: Ankit Karis Juba, MD at 04/09/2015 10:58 AM  Revision History     Date/Time User Provider Type Action  04/09/2015 10:58 AM Ankit Karis Juba, MD Physician Cosign   04/09/2015 10:56 AM Trish Mage, RN Rehab Admission Coordinator Sign

## 2015-04-12 NOTE — Progress Notes (Signed)
Ankit Karis Juba, MD Physician Signed Physical Medicine and Rehabilitation Consult Note 04/07/2015 1:05 PM  Related encounter: Admission (Discharged) from 04/06/2015 in [facility]    Expand All Collapse All        Physical Medicine and Rehabilitation Consult   Reason for Consult: B-THR Referring Physician: Dr. Magnus Ivan.    HPI: Spencer Fischer is a 49 y.o. male with history HTN, anxiety, progressive hip pain due to OA for the past 5 years and failure of conservative therapy. His pain was 10/10 in severity and he had associated difficulties with ambulation. He failed conservative treatment and elected to undergo B-THR on 04/06/15 by Dr. Magnus Ivan. Post-op course complicated by pain, tachycardia, AKI, ABLA, PT evaluation done today and limited to transfers due to epidural as well as muscle spasms. CIR recommended by MD and PT for follow up therapy.    Review of Systems  HENT: Negative for hearing loss.  Eyes: Negative for blurred vision and double vision.  Respiratory: Negative for cough, shortness of breath and wheezing.  Cardiovascular: Negative for chest pain and palpitations.  Gastrointestinal: Negative for heartburn, nausea and abdominal pain.   Lack of appetite today  Genitourinary: Negative for urgency and frequency.  Musculoskeletal: Positive for joint pain. Negative for myalgias.  Skin: Negative for itching.  Neurological: Negative for dizziness, speech change, focal weakness and headaches.   B/l CTS  All other systems reviewed and are negative.     Past Medical History  Diagnosis Date  . Hypertension   . Carpal tunnel syndrome   . Arthritis   . Anxiety     Past Surgical History  Procedure Laterality Date  . Knee surgery Left     History reviewed. No pertinent family history.    Social History: Currently living with mother. Reports " Homeless---have to sleep on the couch" but can  return to mother's home after discharge. Disabled due to hip pain and was sedentary. Used walker or chair for mobility PTA. He reports that he has been smoking Cigarettes. He has been smoking about 0.25 packs per day. He does not have any smokeless tobacco history on file. He reports that he drinks about 12 pack per week. He reports that he does not use illicit drugs.    Allergies: No Known Allergies    Medications Prior to Admission  Medication Sig Dispense Refill  . HYDROcodone-acetaminophen (NORCO/VICODIN) 5-325 MG per tablet Take 2 tablets by mouth every 4 (four) hours as needed. 10 tablet 0  . triamterene-hydrochlorothiazide (MAXZIDE-25) 37.5-25 MG tablet Take 1 tablet by mouth daily.  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 Pt able to scoot to EOB once in sitting Transfers Overall transfer level: Needs assistance Equipment used: 2 person hand held assist Transfers: Sit to/from Stand, Stand Pivot Transfers Sit to Stand: Mod assist, +2 physical  assistance Stand pivot transfers: Mod assist, +2 physical assistance General transfer comment: mobility limited by epidural, arm in arm assist used for better control of pt in case knees buckled. Pt locked out knees, no buckling noted. Had  difficulty stepping feet and needed mod support throughout transfer to chair Ambulation/Gait General Gait Details: did not test while pt with epidural    ADL:    Cognition: Cognition Overall Cognitive Status: Within Functional Limits for tasks assessed Orientation Level: Oriented X4 Cognition Arousal/Alertness: Awake/alert Behavior During Therapy: Anxious, WFL for tasks assessed/performed Overall Cognitive Status: Within Functional Limits for tasks assessed   Blood pressure 129/66, pulse 104, temperature 99.2 F (37.3 C), temperature source Oral, resp. rate 18, height  (1.803 m), weight 104.781 kg (231 lb), SpO2 96 %. Physical Exam  Nursing note and vitals reviewed. Constitutional: He is oriented to person, place, and time. He appears well-developed and well-nourished.  Restless and distracted due to pain  HENT:  Head: Normocephalic and atraumatic.  Eyes: Conjunctivae and EOM are normal. Pupils are equal, round, and reactive to light.  Neck: Normal range of motion. Neck supple.  Cardiovascular: Normal rate and regular rhythm.  Respiratory: Effort normal and breath sounds normal. No respiratory distress. He has no wheezes.  GI: Soft. Bowel sounds are normal. He exhibits no distension. There is no tenderness.  Musculoskeletal: He exhibits edema and tenderness.  Strength b/l UE 5/5 grossly B/l LE hip flex 3-/5 grossly (limited by pain), 5/5 ankle dorsi/plantar flexion  Neurological: He is alert and oriented to person, place, and time. No cranial nerve deficit.  Skin: Skin is warm and dry. No rash noted.  Psychiatric: He has a normal mood and affect. His speech is normal and behavior is normal. Cognition and memory are normal.     Lab Results Last 24 Hours    Results for orders placed or performed during the hospital encounter of 04/06/15 (from the past 24 hour(s))  CBC Status: Abnormal   Collection Time: 04/07/15 6:55 AM  Result Value Ref Range   WBC  13.3 (H) 4.0 - 10.5 K/uL   RBC 3.70 (L) 4.22 - 5.81 MIL/uL   Hemoglobin 11.4 (L) 13.0 - 17.0 g/dL   HCT 21.3 (L) 08.6 - 57.8 %   MCV 89.7 78.0 - 100.0 fL   MCH 30.8 26.0 - 34.0 pg   MCHC 34.3 30.0 - 36.0 g/dL   RDW 46.9 62.9 - 52.8 %   Platelets 205 150 - 400 K/uL  Basic metabolic panel Status: Abnormal   Collection Time: 04/07/15 6:55 AM  Result Value Ref Range   Sodium 130 (L) 135 - 145 mmol/L   Potassium 3.6 3.5 - 5.1 mmol/L   Chloride 98 (L) 101 - 111 mmol/L   CO2 23 22 - 32 mmol/L   Glucose, Bld 125 (H) 65 - 99 mg/dL   BUN 12 6 - 20 mg/dL   Creatinine, Ser 4.13 (H) 0.61 - 1.24 mg/dL   Calcium 8.7 (L) 8.9 - 10.3 mg/dL   GFR calc non Af Amer 57 (L) >60 mL/min   GFR calc Af Amer >60 >60 mL/min   Anion gap 9 5 - 15      Imaging Results (Last 48 hours)    Dg Pelvis Portable  04/06/2015 CLINICAL DATA: Status post bilateral hip arthroplasty EXAM: PORTABLE PELVIS 1-2 VIEWS COMPARISON: None. FINDINGS: Bilateral hip replacements are noted. Pelvic ring appears intact. No acute bony abnormality is seen. IMPRESSION: Status post bilateral hip arthroplasty Electronically Signed By:  Alcide Clever M.D. On: 04/06/2015 16:34   Dg Hip Operative Unilat With Pelvis Left  04/06/2015 CLINICAL DATA: Patient status post left hip arthroplasty. EXAM: OPERATIVE LEFT HIP (WITH PELVIS IF PERFORMED) 2 VIEWS TECHNIQUE: Fluoroscopic spot image(s) were submitted for interpretation post-operatively. COMPARISON: Hip radiographs 01/28/2015 FINDINGS: 2 intraoperative fluoroscopic images were submitted. Patient status post left hip arthroplasty. No definite evidence for acute abnormality. IMPRESSION: Patient status post left hip arthroplasty. Electronically Signed By: Annia Belt M.D. On: 04/06/2015 16:02   Dg Hip Operative Unilat With Pelvis Right  04/06/2015 CLINICAL DATA: Patient status post right hip  arthroplasty. EXAM: OPERATIVE RIGHT HIP (WITH PELVIS IF PERFORMED) 2 VIEWS TECHNIQUE: Fluoroscopic spot image(s) were submitted for interpretation post-operatively. COMPARISON: Pelvic radiograph 09/16/2014 FINDINGS: Patient status post right hip arthroplasty. No evidence for acute abnormality. IMPRESSION: Patient status post right hip arthroplasty. No acute process. Electronically Signed By: Annia Belt M.D. 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 Records reviewed and summated above. Oral pharmacological pain control  Bowel program: consider colace, miralax and/or Senna. PRN suppository Fall precautions Monitor surgical wound and skin especially over pressure sensitive areas Prevent immobility complications: pressure ulcers, contractures, HO Consider modalities, such as TENs PT/OT consults for mobility strengthening, endurance training and adaptive ADLs   1. Does the need for close, 24 hr/day medical supervision in concert with the patient's rehab needs make it unreasonable for this patient to be served in a less intensive setting? Yes 2. Co-Morbidities requiring supervision/potential complications: Tachycardia (monitor in conjunction with therapies and address pain if necessary), pain (cont to monitor in accordance with therapies), hyponatremia, AKI (avoid nephrotoxic meds), OA (ensure pain does not limit progress in therapies), anxiety (treat if necessary, so that it does not interfere with functional progress), ABLA (transfuse if appropriate to maximize energy for therapies), b/l CTS (avoid further pain and injury with adaptive techniques), HTN (monitor and adjust meds as activity levels increase), tobacco abuse (consider counseling to promote wound healing, prosthesis longevity, and overall health) 3. Due to safety, skin/wound care, disease management, medication administration, pain management and  patient education, does the patient require 24 hr/day rehab nursing? Yes 4. Does the patient require coordinated care of a physician, rehab nurse, PT (1.5-2 hrs/day, 5 days/week) and OT (1.5-2 hrs/day, 5 days/week) to address physical and functional deficits in the context of the above medical diagnosis(es)? Yes Addressing deficits in the following areas: balance, endurance, locomotion, strength, transferring, bathing, dressing, toileting and psychosocial support 5. Can the patient actively participate in an intensive therapy program of at least 3 hrs of therapy per day at least 5 days per week? Potentially 6. The potential for patient to make measurable gains while on inpatient rehab is excellent and good 7. Anticipated functional outcomes upon discharge from inpatient rehab are modified independent and supervision with PT, modified independent and supervision with OT, n/a with SLP. 8. Estimated rehab length of stay to reach the above functional goals is: 14-16 days. 9. Does the patient have adequate social supports and living environment to accommodate these discharge functional goals? Potentially 10. Anticipated D/C setting: Home 11. Anticipated post D/C treatments: HH therapy and Home excercise program 12. Overall Rehab/Functional Prognosis: excellent  RECOMMENDATIONS: This patient's condition is appropriate for continued rehabilitative care in the following setting: CIR Patient has agreed to participate in recommended program. Yes Note that insurance prior authorization may be required for reimbursement for recommended care.  Comment: Rehab Admissions Coordinator to  follow up.  Maryla MorrowAnkit Patel, MD 04/07/2015       Revision History     Date/Time User Provider Type Action   04/07/2015 3:08 PM Ankit Karis JubaAnil Patel, MD Physician Sign   04/07/2015 2:30 PM Jacquelynn CreePamela S Love, PA-C Physician Assistant Share   04/07/2015 2:28 PM Jacquelynn CreePamela S Love, PA-C Physician Assistant Share   View Details  Report       Routing History     Date/Time From To Method   04/07/2015 3:08 PM Ankit Karis JubaAnil Patel, MD Ankit Karis [facility]ubaAnil Patel, MD [facility]

## 2015-04-12 NOTE — Progress Notes (Signed)
Occupational Therapy Session Note  Patient Details  Name: Spencer Fischer L Harnish MRN: [id number] Date of Birth: 12/10/1965  Today's Date: 04/12/2015 OT Individual Time: [id number] OT Individual Time Calculation (min): 60 min   Short Term Goals: Week 1:  OT Short Term Goal 1 (Week 1): Pt. will bathe self at mod I level OT Short Term Goal 2 (Week 1): Pt will dress self at mod I level OT Short Term Goal 3 (Week 1): Pt will transfer self to toilet at mod I level  OT Short Term Goal 4 (Week 1): pt will perform shower trransfer with sba  Skilled Therapeutic Interventions/Progress Updates: ADL-retraining with focus on dynamic standing balance, grooming, self-feeding, and discharge planning.   Pt received supine in bed and requesting breakfast prior to participation in planned session.   Pt self-feeds after setup while discussing discharge plans, complicated by homelessness prior to admission.   With his sister Mia present, pt declares that he will live with his sister while he's recovering.   Pt's declaration isn't received well by his sister who was surprised by statement but excused herself from session, stating she would return to see her brother to discus matter further.   OT provided feedback on pt's encounter with his sister as pt admitted to being surprised by his sister's reaction.   OT advised clarification and f/u to determine if pt will return to his mother's or begin residing with his sister.   Pt then ambulated to sink with his RW and after setup completed shaving and grooming with intermittent support using countertop to steady.   Pt returns to bed at end of session with min assist to bring legs back into bed.    OT demonstrates use of LH sponge for next visit covering use of AE during BADL.   Pt left in bed with all needs within reach.     Therapy Documentation Precautions:  Precautions Precautions: Fall Restrictions Weight Bearing Restrictions: Yes RLE Weight Bearing: Weight bearing as  tolerated LLE Weight Bearing: Weight bearing as tolerated  Pain: Pain Assessment Pain Score: 8  Pain Type: Surgical pain Pain Location: Hip Pain Orientation: Right;Left Pain Descriptors / Indicators: Aching Pain Frequency: Constant Pain Onset: On-going Pain Intervention(s): Emotional support  See Function Navigator for Current Functional Status.   Therapy/Group: Individual Therapy   Second session: Time: 1030-1200 Time Calculation (min): 90 min  Pain Assessment: 8/10, bilateral hips, surgical pain, shower, distraction, cold therapy (ice packs)  Skilled Therapeutic Interventions: ADL-retraining with focus on skill development using LH sponge, leg lifter, reacher, sock aid; dynamic standing balance, functional mobility using RW, transfers.   Pt received supine in bed, receptive for planned shower level ADL since having a BM prior to session.   Pt ambulates with setup to provide RW and min guard during mobility.   Pt transfers to shower chair with vc for technique and standby assist.   Pt educated on use of LH sponge to reach his feet while bathing.   Pt completes bathing unassisted after setup and dresses while sitting on BSC over toilet.   Pt requires only setup assist to dress.   Pt recovers to bed at end of session, using leg lifter to manage lifting and moving his legs independently.      See FIM for current functional status  Therapy/Group: Individual Therapy  Corley Maffeo 04/12/2015, 12:31 PM

## 2015-04-12 NOTE — Progress Notes (Signed)
Patient information reviewed and entered into eRehab system by Charidy Cappelletti, RN, CRRN, PPS Coordinator.  Information including medical coding and functional independence measure will be reviewed and updated through discharge.    

## 2015-04-12 NOTE — Progress Notes (Signed)
Physical Therapy Session Note  Patient Details  Name: Spencer Fischer MRN: [id number] Date of Birth: 01/09/1966  Today's Date: 04/12/2015 PT Individual Time: 1500-1530 PT Individual Time Calculation (min): 30 min   Short Term Goals: Week 1:  PT Short Term Goal 1 (Week 1): STGs = LTGs due to ELOS  Skilled Therapeutic Interventions/Progress Updates:    Pt received seated on edge of mat table at completion of previous PT session; c/o pain as below and agreeable to treatment. Performed sit<>supine with supervision on mat table, increased time to perform and pt hesitant with mobility due to pain. Performed supine LE strengthening exercises for 3 sets of 10-15 reps of each of the following: heel slides, hip abduction/adduction in SLR position in limited ROM, bridging. Sit <>stand from edge of mat table for 2 sets 10 reps. Transfer mat >w/c with close supervision and RW. W/c propulsion to return to room x125' with S. Stand pivot w/c >bed with close S. Sit >supine with HOB elevated and leg lifter on LLE, minA for assisting LLE onto bed. Pt remained seated in bed with all needs in reach at completion of session.   Therapy Documentation Precautions:  Precautions Precautions: Fall Restrictions Weight Bearing Restrictions: Yes RLE Weight Bearing: Weight bearing as tolerated LLE Weight Bearing: Weight bearing as tolerated Pain: Pain Assessment Pain Assessment: 0-10 Pain Score: 8  Pain Type: Surgical pain Pain Location: Hip Pain Orientation: Right;Left Pain Descriptors / Indicators: Aching Pain Onset: On-going Patients Stated Pain Goal: 6 Pain Intervention(s): Repositioned;RN made aware Multiple Pain Sites: No   See Function Navigator for Current Functional Status.   Therapy/Group: Individual Therapy  Vista Lawmanlizabeth J Tygielski 04/12/2015, 3:53 PM

## 2015-04-12 NOTE — Care Management Note (Signed)
[facility]patient [facility] Individual Statement of Services  Patient Name:  Spencer Fischer  Date:  04/12/2015  Welcome to the [facility]patient [facility].  Our goal is to provide you with an individualized program based on your diagnosis and situation, designed to meet your specific needs.  With this comprehensive rehabilitation program, you will be expected to participate in at least 3 hours of rehabilitation therapies Monday-Friday, with modified therapy programming on the weekends.  Your rehabilitation program will include the following services:  Physical Therapy (PT), Occupational Therapy (OT), 24 hour per day rehabilitation nursing, Therapeutic Recreaction (TR), Neuropsychology, Case Management (Social Worker), Rehabilitation Medicine, Nutrition Services and Pharmacy Services  Weekly team conferences will be held on Wednesday to discuss your progress.  Your Social Worker will talk with you frequently to get your input and to update you on team discussions.  Team conferences with you and your family in attendance may also be held.  Expected length of stay: 7-10 DAYS  Overall anticipated outcome: MOD/I LEVEL  Depending on your progress and recovery, your program may change. Your Social Worker will coordinate services and will keep you informed of any changes. Your Social Worker's name and contact numbers are listed  below.  The following services may also be recommended but are not provided by the Inpatient Rehabilitation Center:    Home Health Rehabiltiation Services  Outpatient Rehabilitation Services  Vocational Rehabilitation   Arrangements will be made to provide these services after discharge if needed.  Arrangements include referral to agencies that provide these services.  Your insurance has been verified to be:  Medicaid Your primary doctor is:  Dr. Willey BladeEric Dean  Pertinent information will be shared with your doctor and your insurance company.  Social Worker:  Dossie DerBecky  [place], SW [phone] or ([phone]) [phone]  Information discussed with and copy given to patient by: Lucy Chrisupree, Anddy Wingert G, 04/12/2015, 9:43 AM

## 2015-04-13 ENCOUNTER — Inpatient Hospital Stay (HOSPITAL_COMMUNITY): Payer: No Typology Code available for payment source

## 2015-04-13 ENCOUNTER — Inpatient Hospital Stay (HOSPITAL_COMMUNITY): Payer: No Typology Code available for payment source | Admitting: Physical Therapy

## 2015-04-13 ENCOUNTER — Inpatient Hospital Stay (HOSPITAL_COMMUNITY): Payer: Medicaid Other

## 2015-04-13 ENCOUNTER — Inpatient Hospital Stay (HOSPITAL_COMMUNITY): Payer: No Typology Code available for payment source | Admitting: Occupational Therapy

## 2015-04-13 DIAGNOSIS — K219 Gastro-esophageal reflux disease without esophagitis: Secondary | ICD-10-CM | POA: Diagnosis present

## 2015-04-13 DIAGNOSIS — Z9660 Presence of unspecified orthopedic joint implant: Secondary | ICD-10-CM

## 2015-04-13 MED ORDER — MUSCLE RUB 10-15 % EX CREA
2.0000 "application " | TOPICAL_CREAM | Freq: Two times a day (BID) | CUTANEOUS | Status: DC | PRN
  Filled 2015-04-13: qty 85

## 2015-04-13 MED ORDER — CLONAZEPAM 0.5 MG PO TABS
0.2500 mg | ORAL_TABLET | Freq: Two times a day (BID) | ORAL | Status: DC | PRN
  Administered 2015-04-15 (×2): 0.25 mg via ORAL
  Filled 2015-04-13 (×3): qty 1

## 2015-04-13 MED ORDER — ALUM & MAG HYDROXIDE-SIMETH 200-200-20 MG/5ML PO SUSP: 15.0000 mL | Freq: Four times a day (QID) | ORAL | Status: DC | PRN

## 2015-04-13 NOTE — Progress Notes (Addendum)
Physical Therapy Session Note  Patient Details  Name: Graylon GoodKevin L Minkin MRN: [id number] Date of Birth: 10/21/1965  Today's Date: 04/13/2015 PT Individual Time: 0900-1000 PT Individual Time Calculation (min): 60 min   Short Term Goals: Week 1:  PT Short Term Goal 1 (Week 1): STGs = LTGs due to ELOS  Skilled Therapeutic Interventions/Progress Updates:   Bed mobility with raised HOB and rails, with supervision.  Gait in room to toilet and sink for toileting, with RW, close supervision. Pt only able to state hip precaution of not crossing LEs.   W/c propulsion with distant supervision x 100'.  Therapeutic exercise performed with LEs to increase strength for functional mobility. Self stretching bil hamstrings and heel cords x 30 seconds x 3 and calf raises in sitting; hip flexion in sitting to 90 degrees; mini squats with Rw amd calf raises in standing; alternating heel slides and bil bridging in supine. 10 x 1 except 10 x 2 seated calf raises.  PT instructed pt in paced diaphragmatic breathing for pain control and counting aloud during exertion of exs.    Gait to return to room, supervision, cues for upright posture, forward gaze, reduced reliance on UEs. Stand pivot w/c> bed without AD, supervision.  Pt used lift strap to lift bil LEs into bed.  Bed alarm set and all needs left within reach.  PT educated pt on fitness for life after recover from this surgery.  He has a hx of low back pain.  He exercised at home for strengthening, but never stretched.  Therapy Documentation Precautions:  Precautions Precautions: Fall Restrictions Weight Bearing Restrictions: Yes RLE Weight Bearing: Weight bearing as tolerated LLE Weight Bearing: Weight bearing as tolerated  Pain: Pain Assessment Pain Assessment: 0-10 Pain Score: 8  Pain Type: Surgical pain Pain Location: Hip (bil) Pain Orientation: Right;Left Pain Descriptors / Indicators: Aching Pain Onset: With Activity Pain Intervention(s):  Medication (See eMAR)              See Function Navigator for Current Functional Status.   Therapy/Group: Individual Therapy  Larken Urias 04/13/2015, 12:09 PM

## 2015-04-13 NOTE — Progress Notes (Signed)
Recreational Therapy Session Note  Patient Details  Name: Spencer Fischer MRN: [id number] Date of Birth: 10/24/1965 Today's Date: 04/13/2015  Order received & chart reviewed.  Met with pt today to discuss TR services, leisure education and discharge planning.  Pt stated activities for participation post discharge and [facility]  Due to short LOS, no further TR implemented at this time.  Will continue to monitor through team. Arrianna Catala 04/13/2015, 3:24 PM

## 2015-04-13 NOTE — Progress Notes (Signed)
Occupational Therapy Note  Patient Details  Name: Spencer Fischer MRN: [id number] Date of Birth: 09/26/1965  Today's Date: 04/13/2015 OT Individual Time: 1300-1400 OT Individual Time Calculation (min): 60 min   Pt c/o 7.5/10 pain in bilateral hips; repositioned Individual Therapy  Pt alseep in bed upon arrival and aroused with min verbal cues.  Pt required encouragement to participate. Pt amb with RW to dayroom and engaged in standing BUE exercises and activities with focus on dynamic standing balance.  Pt amb with RW to therapy gym and engaged in BUE therex on [employer] ([employer] at [employer] 10 for 10 mins). Pt returned to room and requested to lay back in bed.  Pt required assistance lifting his LLE into bed.    Spencer Fischer, Spencer Fischer 04/13/2015, 2:23 PM

## 2015-04-13 NOTE — Progress Notes (Signed)
Occupational Therapy Session Note  Patient Details  Name: Spencer Fischer L Yeakle MRN: [id number] Date of Birth: 02/26/1966  Today's Date: 04/13/2015 OT Individual Time: 0800-0900 OT Individual Time Calculation (min): 60 min    Short Term Goals: Week 1:  OT Short Term Goal 1 (Week 1): Pt. will bathe self at mod I level OT Short Term Goal 2 (Week 1): Pt will dress self at mod I level OT Short Term Goal 3 (Week 1): Pt will transfer self to toilet at mod I level  OT Short Term Goal 4 (Week 1): pt will perform shower trransfer with sba  Skilled Therapeutic Interventions/Progress Updates:    Pt resting in bed upon arrival and initially hesitant to participate.  Pt agreed to bathing at sink and dressing with sit<>stand from EOB.  Pt stood at sink to complete bathing tasks.  Pt required more than a reasonable amount of time to complete all tasks.  Pt amb with RW to bathroom and urinated while standing at toilet.  Pt uses AE appropriately to assist with dressing tasks.  Focus on activity tolerance, sit<>stand, standing balance, bed mobility, and safety awareness to increased independence with BADLs.  Therapy Documentation Precautions:  Precautions Precautions: Fall Restrictions Weight Bearing Restrictions: Yes RLE Weight Bearing: Weight bearing as tolerated LLE Weight Bearing: Weight bearing as tolerated  Pain: Pain Assessment Pain Assessment: 0-10 Pain Score: 7  Pain Type: Surgical pain Pain Location: Hip Pain Orientation: Right;Left Pain Descriptors / Indicators: Aching Pain Onset: With Activity Pain Intervention(s): Repositioned  See Function Navigator for Current Functional Status.   Therapy/Group: Individual Therapy  Rich BraveLanier, Makai Dumond Chappell 04/13/2015, 9:02 AM

## 2015-04-13 NOTE — Progress Notes (Signed)
[facility] PHYSICAL MEDICINE & REHABILITATION     PROGRESS NOTE    Subjective/Complaints: Multiple issues " food getting stuck" in throat, some heartburn Bilateral thigh soreness Can't make it to bathroom in time  Discussed risk of hip dislocation with fall  ROS: Pt denies fever, rash/itching, headache, blurred or double vision, nausea, vomiting, abdominal pain, diarrhea, chest pain, shortness of breath, palpitations, dysuria, dizziness,    Objective: Vital Signs: Blood pressure 149/73, pulse 99, temperature 99.1 F (37.3 C), temperature source Oral, resp. rate 20, height  (1.803 m), weight 106.1 kg (233 lb 14.5 oz), SpO2 99 %. No results found.  Recent Labs  04/11/15 0515  WBC 14.5*  HGB 9.7*  HCT 28.9*  PLT 323    Recent Labs  04/11/15 0515  NA 130*  K 3.3*  CL 92*  GLUCOSE 130*  BUN 11  CREATININE 1.04  CALCIUM 9.1   CBG (last 3)  No results for input(s): GLUCAP in the last 72 hours.  Wt Readings from Last 3 Encounters:  04/09/15 106.1 kg (233 lb 14.5 oz)  04/06/15 104.781 kg (231 lb)  03/26/15 104.962 kg (231 lb 6.4 oz)    Physical Exam:  Constitutional: He is oriented to person, place, and time. He appears well-developed and well-nourished.  HENT: throat clear, large tonsils Head: Normocephalic and atraumatic.  Eyes: Conjunctivae and EOM are normal.  Neck: Normal range of motion. Neck supple.  Cardiovascular: Normal rate and regular rhythm.  No murmur heard. No rubs Respiratory: Effort normal and breath sounds normal. No respiratory distress.  GI: Soft. Bowel sounds are normal. He exhibits no distension.  Musculoskeletal: He exhibits edema and tenderness.  Strength b/l UE 5/5 grossly B/l LE hip flex 3-/5 grossly (limited by pain), 5/5 ankle dorsi/plantar flexion  Neurological: He is alert and oriented to person, place, and time.  Skin: Skin is warm and dry.  Incisions clean,no drainage Psychiatric: He has a normal mood and affect. His  behavior is normal.   Assessment/Plan: 1. Functional deficits secondary to oa hips s/p bilateral THA's which require 3+ hours per day of interdisciplinary therapy in a comprehensive inpatient rehab setting. Physiatrist is providing close team supervision and 24 hour management of active medical problems listed below. Physiatrist and rehab team continue to assess barriers to discharge/monitor patient progress toward functional and medical goals.  Function:  Bathing Bathing position   Position: Shower  Bathing parts Body parts bathed by patient: Right arm, Left arm, Chest, Abdomen, Front perineal area, Right upper leg, Buttocks, Right lower leg, Left upper leg, Left lower leg Body parts bathed by helper: Right arm, Left arm, Chest, Abdomen, Front perineal area, Buttocks, Right upper leg, Left upper leg  Bathing assist Assist Level: More than reasonable time, Set up   Set up : To obtain items  Upper Body Dressing/Undressing Upper body dressing   What is the patient wearing?: Pull over shirt/dress     Pull over shirt/dress - Perfomed by patient: Thread/unthread right sleeve, Thread/unthread left sleeve, Put head through opening, Pull shirt over trunk          Upper body assist Assist Level: Set up   Set up : To obtain clothing/put away  Lower Body Dressing/Undressing Lower body dressing   What is the patient wearing?: Pants, Non-skid slipper socks     Pants- Performed by patient: Thread/unthread right pants leg, Thread/unthread left pants leg, Pull pants up/down, Fasten/unfasten pants Pants- Performed by helper: Thread/unthread right pants leg, Thread/unthread left pants leg  Non-skid slipper socks- Performed by patient: Don/doff right sock, Don/doff left sock Non-skid slipper socks- Performed by helper: Don/doff right sock, Don/doff left sock                  Lower body assist Assist for lower body dressing: Assistive device, Supervision or verbal cues, More than reasonable  time      Toileting Toileting Toileting activity did not occur: No continent bowel/bladder event (using urinal) Toileting steps completed by patient: Performs perineal hygiene, Adjust clothing prior to toileting Toileting steps completed by helper: Adjust clothing after toileting Toileting Assistive Devices: Grab bar or rail  Toileting assist Assist level: Touching or steadying assistance (Pt.75%)   Transfers Chair/bed transfer   Chair/bed transfer method: Ambulatory Chair/bed transfer assist level: Supervision or verbal cues Chair/bed transfer assistive device: Armrests     Locomotion Ambulation     Max distance: 75 Assist level: Supervision or verbal cues   Wheelchair   Type: Manual Max wheelchair distance: 125 Assist Level: Supervision or verbal cues  Cognition Comprehension Comprehension assist level: Understands complex 90% of the time/cues 10% of the time  Expression Expression assist level: Expresses basic needs/ideas: With no assist  Social Interaction Social Interaction assist level: Interacts appropriately with others - No medications needed.  Problem Solving Problem solving assist level: Solves basic problems with no assist  Memory Memory assist level: Recognizes or recalls 50 - 74% of the time/requires cueing 25 - 49% of the time   Medical Problem List and Plan: 1. Functional deficits secondary to end stage OA s/p b/l THA 2. DVT Prophylaxis/Anticoagulation: Pharmaceutical: Lovenox. Dopplers negative.  - Resume ASA at discharge.  3. Pain Management: oxy cr added 20mg  q12, oxy ir 20mg  q4 prn  -changed robaxin to 1000mg  qid scheduled  -q2 hour ice, add Sports creme  -sleep/anxiety rx 4. Mood: LCSW to follow for evaluation and support.  5. Neuropsych: This patient is capable of making decisions on his own behalf. 6. Skin/Wound Care: foam dressings to hip incisions---may need something more absorptive 7. Fluids/Electrolytes/Nutrition: Monitor I/O. Offer  supplements between meals.   -continue to supplement potassium  -hyponatremia--I personally reviewed the patient's labs today. Improved to 130 today 8. ABLA:   Added iron supplement. Ihgb at 9.7   9. AKI: resolved, BUN 11, Creat 1.0 10. Leucocytosis: Monitor for signs of infection.wbc down to 14.9 today  11. Resting tachycardia: Question due to deconditioning and pain. Monitor for symptoms with activity 12. Constipation: sorbitol today---2 BM on 10/30, one BM on 10/31 13.  Prob GERD will add protonix, elevate HOB LOS (Days) 4 A FACE TO FACE EVALUATION WAS PERFORMED  Claudette LawsKIRSTEINS,ANDREW E 04/13/2015 7:41 AM

## 2015-04-13 NOTE — Progress Notes (Signed)
Occupational Therapy Session Note  Patient Details  Name: Spencer Fischer L Pattison MRN: [id number] Date of Birth: 01/09/1966  Today's Date: 04/13/2015 OT Individual Time: 1130-1200 OT Individual Time Calculation (min): 30 min    Short Term Goals: Week 1:  OT Short Term Goal 1 (Week 1): Pt. will bathe self at mod I level OT Short Term Goal 2 (Week 1): Pt will dress self at mod I level OT Short Term Goal 3 (Week 1): Pt will transfer self to toilet at mod I level  OT Short Term Goal 4 (Week 1): pt will perform shower trransfer with sba  Skilled Therapeutic Interventions/Progress Updates:    Pt seen for OT therapy session focusing on ADL/IADL re-training. Pt in supine upon arrival, voicing increased anxiety regarding setting up d/c disposition. Offered to take pt to family room to use internet, however, pt declined, and willing to participate in regular therapy session. Pt transferred to EOB without use of hospital bed functions in prep for d/c. He required verbal/ tactile cues for log rolling technique and increased time due to pain. He ambulated throughout room, completing toileting task and functional transfers with RW. Min-mod VCs required for RW management during functional ambulation. He then walked to ADL apartment, where he emptied/ replaced items in overhead cabinet working on functional standing balance and endurance. Education/ demonstration provided regarding kitche mobility including accessing oven and transporting items while using RW. Pt demonstrated good functional standing balance and endurance during session, tolerating ~15 minutes of dynamic standing.  He returned to room at end of session, encouraged to sit up in recliner for lunch, however, declined due to fatigue. Pt transferred to supine using leg lifter and VCs. Left in supine with all needs in reach and bed alarm on.  Pt educated throughout session regarding energy conservation, RW management, planning for good days and bad days, and d/c  planning.   Therapy Documentation Precautions:  Precautions Precautions: Fall Restrictions Weight Bearing Restrictions: Yes RLE Weight Bearing: Weight bearing as tolerated LLE Weight Bearing: Weight bearing as tolerated Pain: Pain Assessment Pain Assessment: 0-10 Pain Score: 7  Pain Type: Surgical pain Pain Location: Hip Pain Orientation: Right;Left Pain Descriptors / Indicators: Aching Pain Onset: With Activity Pain Intervention(s): Repositioned;Ambulation/increased activity  See Function Navigator for Current Functional Status.   Therapy/Group: Individual Therapy  Lewis, Alyanna Stoermer C 04/13/2015, 7:26 AM

## 2015-04-13 NOTE — Progress Notes (Signed)
Continues to sleep poorly.  At 2203 PRN trazodone and scheduled robaxin given. 0028 PRN Oxy IR 20mg 's given, usually rating pain a 7 or 8. Ice applied to upper thighs. 0245 PRN ultram given. Asking to go outside to smoke-? Add nicotine patch. 0431 PRN oxy IR given. Alfredo MartinezMurray, Saphia Vanderford A

## 2015-04-14 ENCOUNTER — Inpatient Hospital Stay (HOSPITAL_COMMUNITY): Payer: No Typology Code available for payment source

## 2015-04-14 ENCOUNTER — Inpatient Hospital Stay (HOSPITAL_COMMUNITY): Payer: No Typology Code available for payment source | Admitting: Occupational Therapy

## 2015-04-14 ENCOUNTER — Inpatient Hospital Stay (HOSPITAL_COMMUNITY): Payer: Medicaid Other | Admitting: Physical Therapy

## 2015-04-14 DIAGNOSIS — E876 Hypokalemia: Secondary | ICD-10-CM | POA: Diagnosis present

## 2015-04-14 DIAGNOSIS — K210 Gastro-esophageal reflux disease with esophagitis: Secondary | ICD-10-CM

## 2015-04-14 LAB — BASIC METABOLIC PANEL
Anion gap: 11 (ref 5–15)
BUN: 13 mg/dL (ref 6–20)
CHLORIDE: 95 mmol/L — AB (ref 101–111)
CO2: 29 mmol/L (ref 22–32)
CREATININE: 0.94 mg/dL (ref 0.61–1.24)
Calcium: 9.4 mg/dL (ref 8.9–10.3)
Glucose, Bld: 110 mg/dL — ABNORMAL HIGH (ref 65–99)
POTASSIUM: 3.9 mmol/L (ref 3.5–5.1)
SODIUM: 135 mmol/L (ref 135–145)

## 2015-04-14 LAB — CBC
HCT: 28.6 % — ABNORMAL LOW (ref 39.0–52.0)
Hemoglobin: 9.4 g/dL — ABNORMAL LOW (ref 13.0–17.0)
MCH: 29.7 pg (ref 26.0–34.0)
MCHC: 32.9 g/dL (ref 30.0–36.0)
MCV: 90.2 fL (ref 78.0–100.0)
PLATELETS: 432 10*3/uL — AB (ref 150–400)
RBC: 3.17 MIL/uL — AB (ref 4.22–5.81)
RDW: 14.1 % (ref 11.5–15.5)
WBC: 15.5 10*3/uL — AB (ref 4.0–10.5)

## 2015-04-14 MED ORDER — DOCUSATE SODIUM 100 MG PO CAPS
100.0000 mg | ORAL_CAPSULE | Freq: Two times a day (BID) | ORAL | Status: DC
  Administered 2015-04-14 – 2015-04-20 (×12): 100 mg via ORAL
  Filled 2015-04-14 (×12): qty 1

## 2015-04-14 MED ORDER — SENNA 8.6 MG PO TABS
1.0000 | ORAL_TABLET | Freq: Every day | ORAL | Status: DC
  Administered 2015-04-14: 8.6 mg via ORAL
  Filled 2015-04-14: qty 1

## 2015-04-14 MED ORDER — MUSCLE RUB 10-15 % EX CREA
2.0000 "application " | TOPICAL_CREAM | Freq: Two times a day (BID) | CUTANEOUS | Status: DC
  Administered 2015-04-14 – 2015-04-19 (×7): 2 via TOPICAL
  Filled 2015-04-14 (×2): qty 85

## 2015-04-14 NOTE — Progress Notes (Signed)
Physical Therapy Session Note  Patient Details  Name: Spencer Fischer MRN: [id number] Date of Birth: 06/17/1965  Today's Date: 04/14/2015 PT Individual Time: 0800-0900 PT Individual Time Calculation (min): 60 min   Short Term Goals: Week 1:  PT Short Term Goal 1 (Week 1): STGs = LTGs due to ELOS  Skilled Therapeutic Interventions/Progress Updates:   Gait with RW x 100' x 2 with supervision.  Pt reported that he attempted sit>partial stand EOB last night; bed alarm went off.  Falls risk discussed at length with pt, including avoiding extra exs for right now.  Therapeutic exercise performed with LEs to increase strength for functional mobility: self stretching bil hamstrings and heel cords in sitting, x 30 seconds x 3, long arc knee ext with 5 second hold at end range, hip flex, ankle PF. Iin supine, bil bridging with UEs on/off chest, modified abdominal crunch. All 2 sets of 10.  Sit>supine on mat with mod assist for bil LEs; supine > sit with multi modal cues to rolling to side lying, then move feet off of mat.    Pt transferred to bed with mod assist for bil LEs.  PT advised pt to request ice for hips and refrain from performing exs on his own in room. All needs placed within reach and bed alarm set. Therapy Documentation Precautions:  Precautions Precautions: Fall Restrictions Weight Bearing Restrictions: Yes RLE Weight Bearing: Weight bearing as tolerated LLE Weight Bearing: Weight bearing as tolerated   Pain: Pain Assessment Pain Assessment: 0-10 Pain Score: 7  Pain Intervention(s): Medication (See eMAR)      See Function Navigator for Current Functional Status.   Therapy/Group: Individual Therapy  Ferrel Simington 04/14/2015, 4:15 PM

## 2015-04-14 NOTE — Interval H&P Note (Signed)
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  The patient's history has been reviewed, patient examined, and there is no change in status.  Patient continues to be appropriate for intensive inpatient rehabilitation.  I have reviewed the patient's chart and labs.  Questions were answered to the patient's satisfaction. The PAPE has been reviewed and assessment remains appropriate.  Erick ColaceKIRSTEINS,ANDREW E 04/14/2015, 6:47 AM

## 2015-04-14 NOTE — Progress Notes (Signed)
Physical Therapy Session Note  Patient Details  Name: Spencer Fischer MRN: [id number] Date of Birth: 09/07/1965  Today's Date: 04/14/2015 PT Individual Time: 1100-1200 PT Individual Time Calculation (min): 60 min   Short Term Goals: Week 1:  PT Short Term Goal 1 (Week 1): STGs = LTGs due to ELOS  Skilled Therapeutic Interventions/Progress Updates:    Pt received in bed - c/o significant abdominal cramping from constipation - requesting time to "deal with the pain" and so PT gives pt resting time. Therapeutic Activity - Pt completes bed mobility supine to/from sit with increased time via long sit method. Pt uses R LE to assist L LE into bed. PT instructs pt in alternate method of scooting posterior from short to long sit in order to passively extend knees and assist them onto the bed. Gait Training - Pt ambulates from room to/from gym with RW - focus on upright posture and larger step length. RW squeaks significantly - PT instructs pt to reduce pressure on UEs and pt reports he can't due to pain in legs. Stair Training - PT instructs pt in step over step technique with B rails when doing corner flight in gym (6" height on 4 steps; 3" height on 8 steps) req CGA for safety x 2 sets. Therapeutic Exercise - PT instructs pt in standing (and one seated) B LE strengthening exercises within pt's anterior hip precautions: LAQ x 5 second holds, mini-squats, heel/toe raises x 5 second holds, standing knee flexion: x 10 reps each. Pt ended in bed with all needs in reach, applying ice to hips. Pt's functional mobility is progressing - continue per PT POC.   Therapy Documentation Precautions:  Precautions Precautions: Fall Restrictions Weight Bearing Restrictions: Yes RLE Weight Bearing: Weight bearing as tolerated LLE Weight Bearing: Weight bearing as tolerated  Pain: Pain Assessment Pain Assessment: 0-10 Pain Score: 10-Worst pain ever Pain Type: Acute pain Pain Location: Abdomen Pain Orientation:  Left;Right Pain Descriptors / Indicators: Cramping Pain Frequency: Intermittent Pain Onset: Gradual Patients Stated Pain Goal: 4 Pain Intervention(s): Rest Multiple Pain Sites: No   See Function Navigator for Current Functional Status.   Therapy/Group: Individual Therapy  Spencer Fischer M 04/14/2015, 11:07 AM

## 2015-04-14 NOTE — Progress Notes (Signed)
[facility] PHYSICAL MEDICINE & REHABILITATION     PROGRESS NOTE    Subjective/Complaints: Pt on call bell up to 30 times a shift per RN, discussed with pt need to consolidate requests Per pt, orhto stopped by to check wounds  ROS: Pt denies fever, rash/itching, headache, blurred or double vision, nausea, vomiting, abdominal pain, diarrhea, chest pain, shortness of breath, palpitations, dysuria, dizziness,    Objective: Vital Signs: Blood pressure 144/75, pulse 108, temperature 99.6 F (37.6 C), temperature source Oral, resp. rate 20, height 5' 11"  (1.803 m), weight 106.1 kg (233 lb 14.5 oz), SpO2 98 %. No results found.  Recent Labs  04/14/15 0403  WBC 15.5*  HGB 9.4*  HCT 28.6*  PLT 432*    Recent Labs  04/14/15 0403  NA 135  K 3.9  CL 95*  GLUCOSE 110*  BUN 13  CREATININE 0.94  CALCIUM 9.4   CBG (last 3)  No results for input(s): GLUCAP in the last 72 hours.  Wt Readings from Last 3 Encounters:  04/09/15 106.1 kg (233 lb 14.5 oz)  04/06/15 104.781 kg (231 lb)  03/26/15 104.962 kg (231 lb 6.4 oz)    Physical Exam:  Constitutional: He is oriented to person, place, and time. He appears well-developed and well-nourished.  HENT: throat clear, large tonsils Head: Normocephalic and atraumatic.  Eyes: Conjunctivae and EOM are normal.  Neck: Normal range of motion. Neck supple.  Cardiovascular: Normal rate and regular rhythm.  No murmur heard. No rubs Respiratory: Effort normal and breath sounds normal. No respiratory distress.  GI: Soft. Bowel sounds are normal. He exhibits no distension.  Musculoskeletal: He exhibits edema and tenderness.  Strength b/l UE 5/5 grossly B/l LE hip flex 3-/5 grossly (limited by pain), 5/5 ankle dorsi/plantar flexion  Neurological: He is alert and oriented to person, place, and time.  Skin: Skin is warm and dry.  Incisions clean,no drainage Psychiatric: He has a normal mood and affect. His behavior is normal.    Assessment/Plan: 1. Functional deficits secondary to oa hips s/p bilateral THA's which require 3+ hours per day of interdisciplinary therapy in a comprehensive inpatient rehab setting. Physiatrist is providing close team supervision and 24 hour management of active medical problems listed below. Physiatrist and rehab team continue to assess barriers to discharge/monitor patient progress toward functional and medical goals.  Team conference today please see physician documentation under team conference tab, met with team face-to-face to discuss problems,progress, and goals. Formulized individual treatment plan based on medical history, underlying problem and comorbidities.  Function:  Bathing Bathing position   Position: Shower  Bathing parts Body parts bathed by patient: Right arm, Left arm, Chest, Abdomen, Front perineal area, Buttocks, Right upper leg, Left upper leg (pt declined to bather feet) Body parts bathed by helper: Right arm, Left arm, Chest, Abdomen, Front perineal area, Buttocks, Right upper leg, Left upper leg  Bathing assist Assist Level: Supervision or verbal cues   Set up : To obtain items  Upper Body Dressing/Undressing Upper body dressing   What is the patient wearing?: Pull over shirt/dress     Pull over shirt/dress - Perfomed by patient: Thread/unthread right sleeve, Thread/unthread left sleeve, Put head through opening, Pull shirt over trunk          Upper body assist Assist Level: More than reasonable time   Set up : To obtain clothing/put away  Lower Body Dressing/Undressing Lower body dressing   What is the patient wearing?: Non-skid slipper socks, Pants  Pants- Performed by patient: Thread/unthread right pants leg, Thread/unthread left pants leg, Pull pants up/down, Fasten/unfasten pants Pants- Performed by helper: Thread/unthread right pants leg, Thread/unthread left pants leg Non-skid slipper socks- Performed by patient: Don/doff right sock,  Don/doff left sock Non-skid slipper socks- Performed by helper: Don/doff right sock, Don/doff left sock                  Lower body assist Assist for lower body dressing: Supervision or verbal cues      Toileting Toileting Toileting activity did not occur: No continent bowel/bladder event Toileting steps completed by patient: Adjust clothing prior to toileting, Performs perineal hygiene, Adjust clothing after toileting Toileting steps completed by helper: Adjust clothing prior to toileting Toileting Assistive Devices: Grab bar or rail  Toileting assist Assist level: Supervision or verbal cues   Transfers Chair/bed transfer   Chair/bed transfer method: Ambulatory Chair/bed transfer assist level: Supervision or verbal cues Chair/bed transfer assistive device: Armrests     Locomotion Ambulation     Max distance: 90 Assist level: Supervision or verbal cues   Wheelchair   Type: Manual Max wheelchair distance: 100 Assist Level: Supervision or verbal cues  Cognition Comprehension Comprehension assist level: Understands complex 90% of the time/cues 10% of the time  Expression Expression assist level: Expresses complex ideas: With no assist  Social Interaction Social Interaction assist level: Interacts appropriately 90% of the time - Needs monitoring or encouragement for participation or interaction.  Problem Solving Problem solving assist level: Solves basic problems with no assist  Memory Memory assist level: Recognizes or recalls 50 - 74% of the time/requires cueing 25 - 49% of the time   Medical Problem List and Plan: 1. Functional deficits secondary to end stage OA s/p b/l THA 2. DVT Prophylaxis/Anticoagulation: Pharmaceutical: Lovenox. Dopplers negative.  - Resume ASA at discharge.  3. Pain Management: oxy cr added 54m q12, oxy ir 215mq4 prn, wean off long acting prior to d/c  -changed robaxin to 100060mid scheduled  -q2 hour ice, add Sports creme, will  schedule  -sleep/anxiety rx 4. Mood: LCSW to follow for evaluation and support.  5. Neuropsych: This patient is capable of making decisions on his own behalf. 6. Skin/Wound Care: foam dressings to hip incisions---may need something more absorptive 7. Fluids/Electrolytes/Nutrition: Monitor I/O. Offer supplements between meals.   -continue to supplement potassium  -hyponatremia-- 8. ABLA:   Added iron supplement. hgb at 9.7, expect resoloution over time   9. AKI: resolved, BUN 11, Creat 1.0 10. Leucocytosis: Monitor for signs of infection.wbc down to 14.9 today  11. Resting tachycardia: Question due to deconditioning and pain. Monitor for symptoms with activity 12. Constipation: sorbitol today---BMs more regular per pt 13.  Prob GERD will add protonix, elevate HOB LOS (Days) 5 A FACE TO FACE EVALUATION WAS PERFORMED  KIRCharlett Blake/07/2014 7:35 AM

## 2015-04-14 NOTE — Patient Care Conference (Signed)
Inpatient RehabilitationTeam Conference and Plan of Care Update Date: 04/14/2015   Time: 2:00 PM    Patient Name: Spencer Fischer      Medical Record Number: [id number]  Date of Birth: Jan 25, 1966 Sex: Male         Room/Bed: 4M12C/4M12C-01 Payor Info: Payor: [employer] DIS[employer] / Plan: [employer] DISCOUNT 100% / Product Type: *No Product type* /    Admitting Diagnosis: B- THR  Admit Date/Time:  04/09/2015  2:53 PM Admission Comments: No comment available   Primary Diagnosis:  Arthritis of both hips Principal Problem: Arthritis of both hips  Patient Active Problem List   Diagnosis Date Noted  . Hypokalemia 04/14/2015  . GERD (gastroesophageal reflux disease) 04/13/2015  . Arthritis of both hips 04/09/2015  . Osteoarthritis of bilateral hips 04/06/2015  . 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    Expected Discharge Date: Expected Discharge Date: 04/20/15  Team Members Present: Physician leading conference: Dr. Maryla Morrow Social Worker Present: Dossie Der, LCSW Nurse Present: Chana Bode, RN PT Present: Katherine Mantle, Conchita Paris, PT OT Present: Roney Mans, OT PPS Coordinator present : Tora Duck, RN, CRRN     Current Status/Progress Goal Weekly Team Focus  Medical   End stage OA s/p b/l THA  Monitor wound, improve pain, monitor hematology  See above   Bowel/Bladder   Continent of bowel and bladder; LBM 11/2 after suppository- on miralax  Mod I  Assess and treat for constipation as needed   Swallow/Nutrition/ Hydration     na        ADL's   supervision overall  mod I overall  safety awareness, home mgmt tasks, activity tolerance, standing balance   Mobility   SBA  mod I   activity tolerance, dynamic standing balance, progressive functional mobility towards mod I, pain control   Communication     na        Safety/Cognition/ Behavioral Observations    no unsafe behaviors        Pain   Oxycontin 20 mg BID; oxycodone 20 mg q4h prn; robaxin  po q6h prn   < 6  Assess and treat for pain q shift and prn   Skin   Bilateral hip incisions with surgical dressing in place  No skin breakdown with mod I assist  Assess skin q shift and prn      *See Care Plan and progress notes for long and short-term goals.  Barriers to Discharge: Wound drainage, pain management, leukocytosis    Possible Resolutions to Barriers:  Manage vs. change wound dressing, optimize pain reg, monitor signs/symptoms infection    Discharge Planning/Teaching Needs:  Pt wanting to [facility] to [facility] or [facility] upon dischare from here. He does not want to sleep on Mom's couch, like he has been doing      Team Discussion:  Goals-mod/i level-pain being managed.  Pt can direct his care. At times he needs cues for safety awareness. MD to find out if pt has precautions or not. Pt needs to find a place to go to at discharge  Revisions to Treatment Plan:  None   Continued Need for Acute Rehabilitation Level of Care: The patient requires daily medical management by a physician with specialized training in physical medicine and rehabilitation for the following conditions: Daily direction of a multidisciplinary physical rehabilitation program to ensure safe treatment while eliciting the highest outcome that is of practical value to the patient.: Yes Daily medical management of patient stability for increased activity  during participation in an intensive rehabilitation regime.: Yes Daily analysis of laboratory values and/or radiology reports with any subsequent need for medication adjustment of medical intervention for : Post surgical problems  [name], Lemar LivingsRebecca G 04/14/2015, 3:02 PM

## 2015-04-14 NOTE — Progress Notes (Signed)
Occupational Therapy Session Note  Patient Details  Name: Spencer Fischer MRN: [id number] Date of Birth: 07/05/1965  Today's Date: 04/14/2015 OT Individual Time: 0900-1000 OT Individual Time Calculation (min): 60 min    Short Term Goals: Week 1:  OT Short Term Goal 1 (Week 1): Pt. will bathe self at mod I level OT Short Term Goal 2 (Week 1): Pt will dress self at mod I level OT Short Term Goal 3 (Week 1): Pt will transfer self to toilet at mod I level  OT Short Term Goal 4 (Week 1): pt will perform shower trransfer with sba  Skilled Therapeutic Interventions/Progress Updates:    Pt engaged in BADL retraining including bathing at shower level and dressing with sit<>stand from EOB.  Pt uses AE appropriately to assist with LB bathing and dressing tasks.  Pt completed all tasks at supervision level with min verbal cues for safety awareness.  Focus on activity tolerance, sit<>stand, functional amb with RW, standing balance and safety awareness to increase independence with BADLs.  Therapy Documentation Precautions:  Precautions Precautions: Fall Restrictions Weight Bearing Restrictions: Yes RLE Weight Bearing: Weight bearing as tolerated LLE Weight Bearing: Weight bearing as tolerated General:   Vital Signs:  Pain: Pain Assessment Pain Assessment: 0-10 Pain Score: 7  Pain Type: Acute pain Pain Location: Hip Pain Orientation: Left;Right Pain Descriptors / Indicators: Aching Pain Frequency: Intermittent Pain Onset: With Activity Patients Stated Pain Goal: 4 Pain Intervention(s):Repositioned; ice applied  See Function Navigator for Current Functional Status.   Therapy/Group: Individual Therapy  Rich BraveLanier, Erryn Dickison Chappell 04/14/2015, 10:21 AM

## 2015-04-14 NOTE — H&P (View-Only) (Signed)
Physical Medicine and Rehabilitation Admission H&P    CC: Impaired gait and pain after b/l THA.  HPI:   Spencer Fischer is a 49 y.o. male with history HTN, anxiety, progressive hip pain due to OA for the past 5 years and failure of conservative therapy. His pain was 10/10 in severity and he had associated difficulties with ambulation. He failed conservative treatment and elected to undergo B-THR on 04/06/15 by Dr. Magnus Ivan. Post-op course complicated by pain, tachycardia, AKI as well as  ABLA.  Today, pain is 7/10, non-radiating, and improved with medications.  He denies associated numbness/tingling.  Therapy ongoing and CIR was recommended by MD and PT for follow up therapy.   Review of Systems  Musculoskeletal: Positive for myalgias and joint pain.  Neurological:       B/l CTS  All other systems reviewed and are negative.  Past Medical History  Diagnosis Date  . Hypertension   . Carpal tunnel syndrome   . Arthritis   . Anxiety    Past Surgical History  Procedure Laterality Date  . Knee surgery Left   . Bilateral anterior total hip arthroplasty Bilateral 04/06/2015    Procedure: BILATERAL ANTERIOR TOTAL HIP ARTHROPLASTY;  Surgeon: Kathryne Hitch, MD;  Location: [facility] OR;  Service: Orthopedics;  Laterality: Bilateral;   History reviewed. No pertinent family history. Social History:  reports that he has been smoking Cigarettes.  He has been smoking about 0.25 packs per day. He has never used smokeless tobacco. He reports that he drinks about 3.6 oz of alcohol per week. He reports that he does not use illicit drugs. Allergies: No Known Allergies Medications Prior to Admission  Medication Sig Dispense Refill  . HYDROcodone-acetaminophen (NORCO/VICODIN) 5-325 MG per tablet Take 2 tablets by mouth every 4 (four) hours as needed. 10 tablet 0  . triamterene-hydrochlorothiazide (MAXZIDE-25) 37.5-25 MG tablet Take 1 tablet by mouth daily.  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  Functional Status:  Mobility: Bed Mobility Overal bed mobility: Needs Assistance Bed Mobility: Supine to Sit Supine to sit: Mod assist, HOB elevated Sit to supine: Min assist General bed mobility comments: pt OOB in chair Transfers Overall transfer level: Needs assistance Equipment used: Rolling walker (2 wheeled) Transfers: Sit to/from Stand Sit to Stand: Min assist Stand pivot transfers: From elevated surface General transfer comment: from recliner Ambulation/Gait Ambulation/Gait assistance: Min guard Ambulation Distance (Feet): 55 Feet Assistive device: Rolling walker (2 wheeled) Gait Pattern/deviations: Decreased step length - right, Decreased step length - left General Gait Details: slow pattern, noted instability but no gross loss of balance. Patient unable to take full strides with either LE.  Gait velocity: very slow pattern Gait velocity interpretation: <1.8 ft/sec, indicative of risk for recurrent falls    ADL: ADL Overall ADL's : Needs assistance/impaired Grooming: Set up Upper Body Bathing: Set up, Sitting Lower Body Bathing: Sit to/from stand, Maximal assistance Upper Body Dressing : Set up, Sitting Lower Body Dressing: Maximal assistance, Sit to/from stand Toilet Transfer: Minimal assistance, RW, Ambulation Toileting- Clothing Manipulation and Hygiene: Minimal assistance, Sit to/from stand Functional mobility during ADLs: Minimal assistance, Rolling walker, Cueing for  safety, Cueing for sequencing General ADL Comments: Pt will benefit from AE for LB ADL. Able to ambulate @  15 ft before requiring a break. Began education on availability of AE. Pt states he is homeless but that the friend that he will live with after D/C has a tub/shower and bathroom is RW accessible.   Cognition: Cognition Overall Cognitive Status: Within Functional Limits for tasks assessed Orientation Level: Oriented X4 Cognition Arousal/Alertness: Awake/alert Behavior During Therapy: WFL for tasks assessed/performed Overall Cognitive Status: Within Functional Limits for tasks assessed    Blood pressure 144/85, pulse 101, temperature 99.2 F (37.3 C), temperature source Oral, resp. rate 18, height  (1.803 m), weight 104.781 kg (231 lb), SpO2 96 %. Physical Exam  Vitals reviewed. Constitutional: He is oriented to person, place, and time. He appears well-developed and well-nourished.  HENT:  Head: Normocephalic and atraumatic.  Eyes: Conjunctivae and EOM are normal.  Neck: Normal range of motion. Neck supple.  Cardiovascular: Normal rate and regular rhythm.   No murmur heard. Respiratory: Effort normal and breath sounds normal. No respiratory distress.  GI: Soft. Bowel sounds are normal. He exhibits no distension.  Musculoskeletal: He exhibits edema and tenderness.  Strength b/l UE 5/5 grossly B/l LE hip flex 3-/5 grossly (limited by pain), 5/5 ankle dorsi/plantar flexion   Neurological: He is alert and oriented to person, place, and time.  Skin: Skin is warm and dry.  Incisions c/d/i  Psychiatric: He has a normal mood and affect. His behavior is normal.    Results for orders placed or performed during the hospital encounter of 04/06/15 (from the past 48 hour(s))  CBC     Status: Abnormal   Collection Time: 04/08/15  8:17 AM  Result Value Ref Range   WBC 19.8 (H) 4.0 - 10.5 K/uL   RBC 3.68 (L) 4.22 - 5.81 MIL/uL   Hemoglobin 11.5 (L) 13.0 - 17.0 g/dL   HCT 40.9 (L) 81.1 - 91.4 %   MCV 89.1 78.0 - 100.0 fL   MCH 31.3 26.0 - 34.0 pg   MCHC 35.1 30.0 - 36.0 g/dL   RDW  78.2 95.6 - 21.3 %   Platelets 201 150 - 400 K/uL  CBC     Status: Abnormal   Collection Time: 04/09/15  5:01 AM  Result Value Ref Range   WBC 16.6 (H) 4.0 - 10.5 K/uL   RBC 3.25 (L) 4.22 - 5.81 MIL/uL   Hemoglobin 9.9 (L) 13.0 - 17.0 g/dL   HCT 08.6 (L) 57.8 - 46.9 %   MCV 88.0 78.0 - 100.0 fL   MCH 30.5 26.0 - 34.0 pg   MCHC 34.6 30.0 - 36.0 g/dL   RDW 62.9 52.8 - 41.3 %   Platelets 227 150 - 400 K/uL   No results found.     Medical Problem List and Plan: 1. Functional deficits secondary to end stage OA s/p b/l THA 2.  DVT Prophylaxis/Anticoagulation: Pharmaceutical: Lovenox. Will check doppler to rule out DVT. Resume ASA at discharge.  3. Pain Management:  Poor as using oxycodone 15 mg every 3 hours at this time. Will add OxyContin for more consistent coverage. Wean at discharge. Ice after therapy.  4. Mood: LCSW to follow for evaluation and support.  5. Neuropsych: This patient is capable of making decisions on his own behalf. 6. Skin/Wound Care: Routine pressure relief measures.  7. Fluids/Electrolytes/Nutrition:  Monitor I/O. Offer supplements between meals.  8. ABLA: Hgb down to 9.9 today. Add iron supplement. Recheck  CBC in am.  9. AKI: Push po fluids. Recheck lytes in am.  10. Leucocytosis: Monitor for signs of infection.   11. Resting tachycardia: Question due to deconditioning and pain. Monitor for symptoms with activity.   Post Admission Physician Evaluation: 1. Functional deficits secondary  to end stage OA s/p b/l THA. 2. Patient is admitted to receive collaborative, interdisciplinary care between the physiatrist, rehab nursing staff, and therapy team. 3. Patient's level of medical complexity and substantial therapy needs in context of that medical necessity cannot be provided at a lesser intensity of care such as a SNF. 4. Patient has experienced substantial functional loss from his/her baseline which was documented above under the "Functional History" and  "Functional Status" headings.  Judging by the patient's diagnosis, physical exam, and functional history, the patient has potential for functional progress which will result in measurable gains while on inpatient rehab.  These gains will be of substantial and practical use upon discharge  in facilitating mobility and self-care at the household level. 5. Physiatrist will provide 24 hour management of medical needs as well as oversight of the therapy plan/treatment and provide guidance as appropriate regarding the interaction of the two. 6. 24 hour rehab nursing will assist with safety, skin/wound care, disease management, medication administration, pain management and patient education and help integrate therapy concepts, techniques,education, etc. 7. PT will assess and treat for/with: Lower extremity strength, range of motion, stamina, balance, functional mobility, safety, adaptive techniques and equipment, woundcare, coping skills, pain control, ROM education.   Goals are: Mod I/Supervision. 8. OT will assess and treat for/with: ADL's, functional mobility, safety, upper extremity strength, adaptive techniques and equipment, wound mgt, ego support, and community reintegration.   Goals are: Mod I/Supervision. Therapy may proceed with showering this patient. 9. Case Management and Social Worker will assess and treat for psychological issues and discharge planning. 10. Team conference will be held weekly to assess progress toward goals and to determine barriers to discharge. 11. Patient will receive at least 3 hours of therapy per day at least 5 days per week. 12. ELOS: 10-12 days.       13. Prognosis:  excellent   Maryla MorrowAnkit Danalee Flath, MD  04/09/2015

## 2015-04-14 NOTE — Progress Notes (Signed)
Occupational Therapy Session Note  Patient Details  Name: Spencer Fischer MRN: [id number] Date of Birth: 09/26/1965  Today's Date: 04/14/2015 OT Individual Time: 1500-1530 OT Individual Time Calculation (min): 30 min    Short Term Goals: Week 1:  OT Short Term Goal 1 (Week 1): Pt. will bathe self at mod I level OT Short Term Goal 2 (Week 1): Pt will dress self at mod I level OT Short Term Goal 3 (Week 1): Pt will transfer self to toilet at mod I level  OT Short Term Goal 4 (Week 1): pt will perform shower trransfer with sba  Skilled Therapeutic Interventions/Progress Updates:    1:1 Pt sitting EOB when arrived. Focused on sit to stands, from different surfaces, activity tolerance, functional ambulation with RW, discussion about tub shower v shower stall transfer and education on bathroom DME. Pt ambulated to ADL apartment with supervision with RW. Pt still unsure of d/c location at this time so discussed and practice both options for shower transfers. Pt performed tub shower transfer with tub bench with assistance to put bilateral LEs in but able to take them out with extra time.  Also performed shower stall transfers; stepping over the simulated shower stall ledge backwards. Returned to room to call about living arrangements post d/c.   Therapy Documentation Precautions:  Precautions Precautions: Fall Restrictions Weight Bearing Restrictions: Yes RLE Weight Bearing: Weight bearing as tolerated LLE Weight Bearing: Weight bearing as tolerated Pain: Pain Assessment Pain Assessment: 0-10 Pain Score: 6  Pain Type: Acute pain Pain Location: Hip Pain Orientation: Left;Right Pain Descriptors / Indicators: Aching Pain Intervention(s): Medication (See eMAR)  Able to continue with therapy thought  See Function Navigator for Current Functional Status.   Therapy/Group: Individual Therapy  Roney MansSmith, Spencer [facility] 04/14/2015, 3:07 PM

## 2015-04-15 ENCOUNTER — Inpatient Hospital Stay (HOSPITAL_COMMUNITY): Payer: Medicaid Other | Admitting: Physical Therapy

## 2015-04-15 ENCOUNTER — Inpatient Hospital Stay (HOSPITAL_COMMUNITY): Payer: Medicaid Other

## 2015-04-15 ENCOUNTER — Inpatient Hospital Stay (HOSPITAL_COMMUNITY): Payer: No Typology Code available for payment source

## 2015-04-15 MED ORDER — OXYCODONE HCL ER 15 MG PO T12A
15.0000 mg | EXTENDED_RELEASE_TABLET | Freq: Two times a day (BID) | ORAL | Status: DC
  Administered 2015-04-15 (×2): 15 mg via ORAL
  Filled 2015-04-15 (×2): qty 1

## 2015-04-15 MED ORDER — SENNA 8.6 MG PO TABS
2.0000 | ORAL_TABLET | Freq: Every day | ORAL | Status: DC
  Administered 2015-04-15 – 2015-04-20 (×6): 17.2 mg via ORAL
  Filled 2015-04-15 (×6): qty 2

## 2015-04-15 MED ORDER — FLEET ENEMA 7-19 GM/118ML RE ENEM
1.0000 | ENEMA | Freq: Once | RECTAL | Status: AC
  Administered 2015-04-15: 1 via RECTAL
  Filled 2015-04-15: qty 1

## 2015-04-15 NOTE — Progress Notes (Signed)
[place] PHYSICAL MEDICINE & REHABILITATION     PROGRESS NOTE    Subjective/Complaints: Constipation, requests enema Pain control ok per pt feels short acting oxy and muscle relaxers help more than long acting oxy  ROS: Pt denies fever, rash/itching, headache, blurred or double vision, nausea, vomiting, abdominal pain, diarrhea, chest pain, shortness of breath, palpitations, dysuria, dizziness,    Objective: Vital Signs: Blood pressure 146/79, pulse 96, temperature 97.1 F (36.2 C), temperature source Oral, resp. rate 18, height 5\' 11"  (1.803 m), weight 108.636 kg (239 lb 8 oz), SpO2 99 %. No results found.  Recent Labs  04/14/15 0403  WBC 15.5*  HGB 9.4*  HCT 28.6*  PLT 432*    Recent Labs  04/14/15 0403  NA 135  K 3.9  CL 95*  GLUCOSE 110*  BUN 13  CREATININE 0.94  CALCIUM 9.4   CBG (last 3)  No results for input(s): GLUCAP in the last 72 hours.  Wt Readings from Last 3 Encounters:  04/14/15 108.636 kg (239 lb 8 oz)  04/06/15 104.781 kg (231 lb)  03/26/15 104.962 kg (231 lb 6.4 oz)    Physical Exam:  Constitutional: He is oriented to person, place, and time. He appears well-developed and well-nourished.  HENT: throat clear, large tonsils Head: Normocephalic and atraumatic.  Eyes: Conjunctivae and EOM are normal.  Neck: Normal range of motion. Neck supple.  Cardiovascular: Normal rate and regular rhythm.  No murmur heard. No rubs Respiratory: Effort normal and breath sounds normal. No respiratory distress.  GI: Soft. Bowel sounds are normal. He exhibits no distension.  Musculoskeletal: He exhibits edema and tenderness.  Strength b/l UE 5/5 grossly B/l LE hip flex 3-/5 grossly (limited by pain), 5/5 ankle dorsi/plantar flexion  Neurological: He is alert and oriented to person, place, and time.  Skin: Skin is warm and dry.  Incisions clean,no drainage Psychiatric: He has a normal mood and affect. His behavior is normal.   Assessment/Plan: 1.  Functional deficits secondary to oa hips s/p bilateral THA's which require 3+ hours per day of interdisciplinary therapy in a comprehensive inpatient rehab setting. Physiatrist is providing close team supervision and 24 hour management of active medical problems listed below. Physiatrist and rehab team continue to assess barriers to discharge/monitor patient progress toward functional and medical goals.  11/8 d/c date was discussed  Function:  Bathing Bathing position   Position: Shower  Bathing parts Body parts bathed by patient: Right arm, Left arm, Chest, Abdomen, Front perineal area, Buttocks, Right upper leg, Left upper leg, Right lower leg, Left lower leg Body parts bathed by helper: Right arm, Left arm, Chest, Abdomen, Front perineal area, Buttocks, Right upper leg, Left upper leg  Bathing assist Assist Level: Supervision or verbal cues, Set up   Set up : To obtain items  Upper Body Dressing/Undressing Upper body dressing   What is the patient wearing?: Pull over shirt/dress     Pull over shirt/dress - Perfomed by patient: Thread/unthread right sleeve, Thread/unthread left sleeve, Put head through opening, Pull shirt over trunk          Upper body assist Assist Level: No help, No cues   Set up : To obtain clothing/put away  Lower Body Dressing/Undressing Lower body dressing   What is the patient wearing?: Underwear, Pants, Non-skid slipper socks Underwear - Performed by patient: Thread/unthread right underwear leg, Thread/unthread left underwear leg, Pull underwear up/down   Pants- Performed by patient: Thread/unthread right pants leg, Thread/unthread left pants leg, Pull  pants up/down, Fasten/unfasten pants Pants- Performed by helper: Thread/unthread right pants leg, Thread/unthread left pants leg Non-skid slipper socks- Performed by patient: Don/doff right sock, Don/doff left sock Non-skid slipper socks- Performed by helper: Don/doff right sock, Don/doff left sock                   Lower body assist Assist for lower body dressing: Supervision or verbal cues      Toileting Toileting Toileting activity did not occur: No continent bowel/bladder event Toileting steps completed by patient: Adjust clothing prior to toileting, Performs perineal hygiene, Adjust clothing after toileting Toileting steps completed by helper: Adjust clothing prior to toileting Toileting Assistive Devices: Grab bar or rail  Toileting assist Assist level: Supervision or verbal cues   Transfers Chair/bed transfer   Chair/bed transfer method: Ambulatory Chair/bed transfer assist level: Supervision or verbal cues Chair/bed transfer assistive device: Environmental consultant, Designer, fashion/clothing     Max distance: 100 Assist level: Supervision or verbal cues   Wheelchair   Type: Manual Max wheelchair distance: 150' Assist Level: No help, No cues, assistive device, takes more than reasonable amount of time  Cognition Comprehension Comprehension assist level: Follows complex conversation/direction with extra time/assistive device  Expression Expression assist level: Expresses complex ideas: With no assist  Social Interaction Social Interaction assist level: Interacts appropriately 90% of the time - Needs monitoring or encouragement for participation or interaction.  Problem Solving Problem solving assist level: Solves basic problems with no assist  Memory Memory assist level: Recognizes or recalls 90% of the time/requires cueing < 10% of the time   Medical Problem List and Plan: 1. Functional deficits secondary to end stage OA s/p b/l THA 2. DVT Prophylaxis/Anticoagulation: Pharmaceutical: Lovenox. Dopplers negative.  - Resume ASA at discharge.  3. Pain Management: oxy cr reduce to  q12, oxy ir  q4 prn, wean off long acting prior to d/c  -changed robaxin to  qid scheduled  -q2 hour ice, add Sports creme, will schedule  -sleep/anxiety rx 4. Mood: LCSW to  follow for evaluation and support.  5. Neuropsych: This patient is capable of making decisions on his own behalf. 6. Skin/Wound Care: foam dressings to hip incisions---may need something more absorptive 7. Fluids/Electrolytes/Nutrition: Monitor I/O. Offer supplements between meals.   -continue to supplement potassium  -hyponatremia-- 8. ABLA:   Added iron supplement. hgb at 9.7, expect resoloution over time   9. AKI: resolved, BUN 11, Creat 1.0 10. Leucocytosis: Monitor for signs of infection.wbc down to 14.9 today  11. Resting tachycardia: Question due to deconditioning and pain. Monitor for symptoms with activity 12. Constipation: adjust laxatives--enema today 13.  Prob GERD will add protonix, elevate HOB LOS (Days) 6 A FACE TO FACE EVALUATION WAS PERFORMED  Erick Colace 04/15/2015 7:39 AM

## 2015-04-15 NOTE — Progress Notes (Signed)
Social Work Patient ID: Spencer Fischer, male   DOB: 1966-05-20, 49 y.o.   MRN: [id number] Met with pt to discuss team conference goals mod/i level goals and discharge 11/8. He is working on getting an apartment next door to his mom's. He is doing well in therapies but is very tired when he is done and needs to nap. Will work on discharge needs.

## 2015-04-15 NOTE — Progress Notes (Signed)
Occupational Therapy Session Note  Patient Details  Name: Spencer Fischer MRN: [id number] Date of Birth: 01/18/1966  Today's Date: 04/15/2015 OT Individual Time: 1100-1200 OT Individual Time Calculation (min): 60 min    Short Term Goals: Week 1:  OT Short Term Goal 1 (Week 1): Pt. will bathe self at mod I level OT Short Term Goal 2 (Week 1): Pt will dress self at mod I level OT Short Term Goal 3 (Week 1): Pt will transfer self to toilet at mod I level  OT Short Term Goal 4 (Week 1): pt will perform shower trransfer with sba  Skilled Therapeutic Interventions/Progress Updates: ADL-retraining with focus on improved activity tolerance, improved functional mobility, transfers and adapted bathing/dressing skills.   Pt progressed through shower level BADL with extra time, 2 rest breaks, and standby assist for mobility and transfers.   Pt able to wash 10 of 10 body parts independently using LH sponge for back and feet.   Pt retains use of shower chair to conserve energy and rest, then ambulates to sit at edge of bed to dress using reacher as instructed to don pants.   Pt requires assist to don TEDs and returns to bed as RN arrives to provide f/u care for planned enema.     Therapy Documentation Precautions:  Precautions Precautions: Fall Restrictions Weight Bearing Restrictions: Yes RLE Weight Bearing: Weight bearing as tolerated LLE Weight Bearing: Weight bearing as tolerated  Vital Signs: Therapy Vitals Temp: 98.1 F (36.7 C) Temp Source: Oral Pulse Rate: (!) 105 Resp: 18 BP: (!) 142/94 mmHg Patient Position (if appropriate): Sitting Oxygen Therapy SpO2: 99 % O2 Device: Not Delivered   Pain: Pain Assessment Pain Assessment: 0-10 Pain Score: 5  Pain Type: Surgical pain Pain Location: Hip Pain Orientation: Right;Left Pain Radiating Towards: knees Pain Descriptors / Indicators: Aching Pain Frequency: Intermittent Pain Onset: With Activity Patients Stated Pain Goal: 4 Pain  Intervention(s): Medication (See eMAR)  See Function Navigator for Current Functional Status.   Therapy/Group: Individual Therapy   Second session: Time: 1300-1400 Time Calculation (min):  60 min  Pain Assessment: 7/10, bilateral LE, rest and ice packs provided at end of session.  Skilled Therapeutic Interventions: Therapeutic activity with focus on improved functional mobility, adapted foot care, improved AROM at hips during lower body BADL, and re-ed on LE exercises.    Pt received sitting at edge of bed and receptive for activity to ambulate out of his room to outdoor area.   Prior to leaving room, pt requested assist with foot care and was provided grooming tool to reach his feet but still lacked flexibility to touch his feet while seated in recliner.  OT instructed pt on slow seated hamstring/low back stretch; pt was then able to reach to the floor while seated in recliner.   Pt received assist with foot care and was instructed on use of long shoe horn to don shoes.   Pt dons shoes with min assist using shoe horn.   Pt ambulates using RW to lobby at old main entrance and performs side-stepping with therapist facilitating using hand hold to stabilize.   Pt then ambulates partially back to his room (30%) but reports fatigue and requires w/c escort to return to room.   See FIM for current functional status  Therapy/Group: Individual Therapy  Spencer Fischer 04/15/2015, 2:14 PM

## 2015-04-15 NOTE — Progress Notes (Signed)
Physical Therapy Session Note  Patient Details  Name: Spencer Fischer MRN: [id number] Date of Birth: 02/08/1966  Today's Date: 04/15/2015 PT Individual Time: 0830-1000 PT Individual Time Calculation (min): 90 min   Short Term Goals: Week 1:  PT Short Term Goal 1 (Week 1): STGs = LTGs due to ELOS  Skilled Therapeutic Interventions/Progress Updates:    Pt received in bed, agreeable to PT. Therapeutic Activity - Pt completes all bed mobility mod I. Pt needs to use bathroom and ambulates to/from toilet with RW and remote supervision - no LOB. Pt req assist to don TED hose and shoes due to tight fit, but is able to don pants himself. Community Integration - PT instructs pt in safe ambulation with RW across elevator thresholds, on uneven surfaces (paved bricks & ramps) - all req SBA, 3 sets of 200'. Neuromuscular Reeducation - PT administers Berg BAlance Test - see below for details. Pt is progressing with activity tolerance and balance, but continues to req a RW for safety due to falls risk indicated by [employer] Corporation[employer] Balance Test. Continue per PT POC.   Therapy Documentation Precautions:  Precautions Precautions: Fall Restrictions Weight Bearing Restrictions: Yes RLE Weight Bearing: Weight bearing as tolerated LLE Weight Bearing: Weight bearing as tolerated Pain: Pain Assessment Pain Assessment: 0-10 Pain Score: 7  Pain Type: Surgical pain Pain Location: Leg Pain Orientation: Left;Right Pain Descriptors / Indicators: Aching;Sore Pain Onset: On-going Pain Intervention(s): RN made aware Multiple Pain Sites: No  Balance: Balance Balance Assessed: Yes Standardized Balance Assessment Standardized Balance Assessment: Berg Balance Test Berg Balance Test Sit to Stand: Able to stand  independently using hands Standing Unsupported: Able to stand safely 2 minutes Sitting with Back Unsupported but Feet Supported on Floor or Stool: Able to sit safely and securely 2 minutes Stand to Sit: Controls descent  by using hands Transfers: Able to transfer safely, definite need of hands Standing Unsupported with Eyes Closed: Able to stand 10 seconds safely Standing Ubsupported with Feet Together: Needs help to attain position but able to stand for 30 seconds with feet together (min A to place feet - able to hold 1 minute) From Standing, Reach Forward with Outstretched Arm: Can reach forward >12 cm safely (5") From Standing Position, Pick up Object from Floor: Unable to pick up and needs supervision From Standing Position, Turn to Look Behind Over each Shoulder: Turn sideways only but maintains balance Turn 360 Degrees: Needs close supervision or verbal cueing Standing Unsupported, Alternately Place Feet on Step/Stool: Able to complete >2 steps/needs minimal assist Standing Unsupported, One Foot in Front: Needs help to step but can hold 15 seconds Standing on One Leg: Unable to try or needs assist to prevent fall Total Score: 31   See Function Navigator for Current Functional Status.   Therapy/Group: Individual Therapy  [name] 04/15/2015, 8:34 AM

## 2015-04-16 ENCOUNTER — Inpatient Hospital Stay (HOSPITAL_COMMUNITY): Payer: Medicaid Other | Admitting: Physical Therapy

## 2015-04-16 ENCOUNTER — Inpatient Hospital Stay (HOSPITAL_COMMUNITY): Payer: No Typology Code available for payment source | Admitting: Occupational Therapy

## 2015-04-16 ENCOUNTER — Inpatient Hospital Stay (HOSPITAL_COMMUNITY): Payer: No Typology Code available for payment source | Admitting: Physical Therapy

## 2015-04-16 LAB — URINALYSIS, ROUTINE W REFLEX MICROSCOPIC
Bilirubin Urine: NEGATIVE
GLUCOSE, UA: NEGATIVE mg/dL
HGB URINE DIPSTICK: NEGATIVE
Ketones, ur: NEGATIVE mg/dL
Leukocytes, UA: NEGATIVE
Nitrite: NEGATIVE
Protein, ur: NEGATIVE mg/dL
SPECIFIC GRAVITY, URINE: 1.018 (ref 1.005–1.030)
Urobilinogen, UA: 1 mg/dL (ref 0.0–1.0)
pH: 6 (ref 5.0–8.0)

## 2015-04-16 LAB — CBC WITH DIFFERENTIAL/PLATELET
BASOS PCT: 0 %
Basophils Absolute: 0 10*3/uL (ref 0.0–0.1)
EOS PCT: 2 %
Eosinophils Absolute: 0.3 10*3/uL (ref 0.0–0.7)
HCT: 28.5 % — ABNORMAL LOW (ref 39.0–52.0)
HEMOGLOBIN: 9.2 g/dL — AB (ref 13.0–17.0)
LYMPHS ABS: 3.3 10*3/uL (ref 0.7–4.0)
Lymphocytes Relative: 21 %
MCH: 29.3 pg (ref 26.0–34.0)
MCHC: 32.3 g/dL (ref 30.0–36.0)
MCV: 90.8 fL (ref 78.0–100.0)
MONOS PCT: 8 %
Monocytes Absolute: 1.2 10*3/uL — ABNORMAL HIGH (ref 0.1–1.0)
NEUTROS ABS: 10.8 10*3/uL — AB (ref 1.7–7.7)
Neutrophils Relative %: 69 %
Platelets: 502 10*3/uL — ABNORMAL HIGH (ref 150–400)
RBC: 3.14 MIL/uL — ABNORMAL LOW (ref 4.22–5.81)
RDW: 14.2 % (ref 11.5–15.5)
WBC: 15.6 10*3/uL — ABNORMAL HIGH (ref 4.0–10.5)

## 2015-04-16 LAB — TSH: TSH: 3.307 u[IU]/mL (ref 0.350–4.500)

## 2015-04-16 MED ORDER — CLONAZEPAM 0.5 MG PO TABS
0.5000 mg | ORAL_TABLET | Freq: Two times a day (BID) | ORAL | Status: DC | PRN
Start: 2015-04-16 — End: 2015-04-18
  Administered 2015-04-16 (×2): 0.5 mg via ORAL
  Filled 2015-04-16 (×3): qty 1

## 2015-04-16 MED ORDER — OXYCODONE HCL ER 10 MG PO T12A
10.0000 mg | EXTENDED_RELEASE_TABLET | Freq: Two times a day (BID) | ORAL | Status: DC
  Administered 2015-04-16 – 2015-04-18 (×6): 10 mg via ORAL
  Filled 2015-04-16 (×6): qty 1

## 2015-04-16 NOTE — Progress Notes (Signed)
Occupational Therapy Session Note  Patient Details  Name: Spencer Fischer MRN: [id number] Date of Birth: 03/11/1966  Today's Date: 04/16/2015 OT Individual Time: [id number] OT Individual Time Calculation (min): 60 min    Short Term Goals: Week 1:  OT Short Term Goal 1 (Week 1): Pt. will bathe self at mod I level OT Short Term Goal 2 (Week 1): Pt will dress self at mod I level OT Short Term Goal 3 (Week 1): Pt will transfer self to toilet at mod I level  OT Short Term Goal 4 (Week 1): pt will perform shower trransfer with sba  Skilled Therapeutic Interventions/Progress Updates:    Engaged in therapeutic activities at RW level  Pt engaged in cleaning room, making bed.  Pt walked around with no LOB or SOB.  He retrieved clothes out for morning ADL as well.  Left pt in room with NT taking vitals.    Therapy Documentation Precautions:  Precautions Precautions: Fall Restrictions Weight Bearing Restrictions: Yes RLE Weight Bearing: Weight bearing as tolerated LLE Weight Bearing: Weight bearing as tolerated      Pain: Pain Assessment Pain Assessment: 0-10 Pain Score: Asleep Pain Type: Surgical pain Pain Location: Hip Pain Orientation: Right;Left Pain Radiating Towards: thighs Pain Descriptors / Indicators: Aching;Sore Pain Frequency: Intermittent Pain Onset: Progressive Patients Stated Pain Goal: 3 Pain Intervention(s): Medication (See eMAR)           See Function Navigator for Current Functional Status.   Therapy/Group: Individual Therapy  Spencer Fischer, Spencer Fischer 04/16/2015, 5:29 PM

## 2015-04-16 NOTE — Progress Notes (Signed)
Physical Therapy Note  Patient Details  Name: Spencer Fischer MRN: [id number] Date of Birth: 02/27/1966 Today's Date: 04/16/2015    Time: 1000-1055 55 minutes  1:1 Pt reports he rec'd pain meds prior to session. PT assisted pt with TED hoes, pt able to don pants with supervision, requires min A to don shoes due to swelling in feet.  Gait throughout room and on unit with mod I with RW.  Stair negotiation with 2 handrails with supervision x 12 stairs.  Standing therex HS curls, heel raises, mini squats x 30 each.  Seated LAQ with 5 second hold x 20.  Nu step for LE strength level 2 x 10 minutes. Pt enjoyed nu step.  Pt improving gait and balance. Still with decreased endurance and increased tightness and weakness in L LE vs R LE.   Spencer Fischer 04/16/2015, 10:54 AM

## 2015-04-16 NOTE — Progress Notes (Signed)
[facility]     PROGRESS NOTE    Subjective/Complaints: Complains of intermittent sweating, no dysuria, no cough, no increased incisional pain Discussed sleep pattern with pt and RN.  Slept during the day at home, intermittent naps RN notes pt anxiety   ROS: Pt denies fever, rash/itching, headache, blurred or double vision, nausea, vomiting, abdominal pain, diarrhea, chest pain, shortness of breath, palpitations, dysuria, dizziness,    Objective: Vital Signs: Blood pressure 149/86, pulse 97, temperature 98.9 F (37.2 C), temperature source Oral, resp. rate 18, height 5\' 11"  (1.803 m), weight 108.636 kg (239 lb 8 oz), SpO2 100 %. No results found.  Recent Labs  04/14/15 0403  WBC 15.5*  HGB 9.4*  HCT 28.6*  PLT 432*    Recent Labs  04/14/15 0403  NA 135  K 3.9  CL 95*  GLUCOSE 110*  BUN 13  CREATININE 0.94  CALCIUM 9.4   CBG (last 3)  No results for input(s): GLUCAP in the last 72 hours.  Wt Readings from Last 3 Encounters:  04/14/15 108.636 kg (239 lb 8 oz)  04/06/15 104.781 kg (231 lb)  03/26/15 104.962 kg (231 lb 6.4 oz)    Physical Exam:  Constitutional: He is oriented to person, place, and time. He appears well-developed and well-nourished.  HENT: throat clear, large tonsils Head: Normocephalic and atraumatic.  Eyes: Conjunctivae and EOM are normal.  Neck: Normal range of motion. Neck supple.  Cardiovascular: Normal rate and regular rhythm.  No murmur heard. No rubs Respiratory: Effort normal and breath sounds normal. No respiratory distress.  GI: Soft. Bowel sounds are normal. He exhibits no distension.  Musculoskeletal: He exhibits edema and tenderness.  Strength b/l UE 5/5 grossly B/l LE hip flex 3-/5 grossly (limited by pain), 5/5 ankle dorsi/plantar flexion  Neurological: He is alert and oriented to person, place, and time.  Skin: Skin is warm and dry.  Incisions clean,no drainage Psychiatric: He has a  normal mood and affect. His behavior is normal.   Assessment/Plan: 1. Functional deficits secondary to oa hips s/p bilateral THA's which require 3+ hours per day of interdisciplinary therapy in a comprehensive inpatient rehab setting. Physiatrist is providing close team supervision and 24 hour management of active medical problems listed below. Physiatrist and rehab team continue to assess barriers to discharge/monitor patient progress toward functional and medical goals.    Function:  Bathing Bathing position   Position: Shower  Bathing parts Body parts bathed by patient: Right arm, Left arm, Chest, Abdomen, Front perineal area, Buttocks, Right upper leg, Left upper leg, Right lower leg, Left lower leg, Back Body parts bathed by helper: Right arm, Left arm, Chest, Abdomen, Front perineal area, Buttocks, Right upper leg, Left upper leg  Bathing assist Assist Level: Supervision or verbal cues   Set up : To obtain items  Upper Body Dressing/Undressing Upper body dressing   What is the patient wearing?: Pull over shirt/dress     Pull over shirt/dress - Perfomed by patient: Thread/unthread right sleeve, Thread/unthread left sleeve, Put head through opening, Pull shirt over trunk          Upper body assist Assist Level: Set up   Set up : To obtain clothing/put away  Lower Body Dressing/Undressing Lower body dressing   What is the patient wearing?: Pants, Non-skid slipper socks, Ted Hose, Shoes Underwear - Performed by patient: Thread/unthread right underwear leg, Thread/unthread left underwear leg, Pull underwear up/down   Pants- Performed by patient: Thread/unthread right  pants leg, Thread/unthread left pants leg, Pull pants up/down, Fasten/unfasten pants Pants- Performed by helper: Thread/unthread right pants leg, Thread/unthread left pants leg Non-skid slipper socks- Performed by patient: Don/doff right sock, Don/doff left sock Non-skid slipper socks- Performed by helper:  Don/doff right sock, Don/doff left sock     Shoes - Performed by patient: Don/doff right shoe Shoes - Performed by helper: Don/doff right shoe, Don/doff left shoe       TED Hose - Performed by helper: Don/doff right TED hose, Don/doff left TED hose  Lower body assist Assist for lower body dressing: Touching or steadying assistance (Pt > 75%)      Toileting Toileting Toileting activity did not occur: No continent bowel/bladder event Toileting steps completed by patient: Adjust clothing prior to toileting, Performs perineal hygiene, Adjust clothing after toileting Toileting steps completed by helper: Adjust clothing prior to toileting Toileting Assistive Devices: Grab bar or rail  Toileting assist Assist level: Supervision or verbal cues   Transfers Chair/bed transfer   Chair/bed transfer method: Ambulatory Chair/bed transfer assist level: No Help, no cues, assistive device, takes more than a reasonable amount of time Chair/bed transfer assistive device: Patent attorney     Max distance: 200' x 4 reps Assist level: Supervision or verbal cues   Wheelchair   Type: Manual Max wheelchair distance: 150' Assist Level: No help, No cues, assistive device, takes more than reasonable amount of time  Cognition Comprehension Comprehension assist level: Follows complex conversation/direction with no assist  Expression Expression assist level: Expresses complex ideas: With no assist  Social Interaction Social Interaction assist level: Interacts appropriately with others - No medications needed.  Problem Solving Problem solving assist level: Solves complex problems: With extra time  Memory Memory assist level: More than reasonable amount of time   Medical Problem List and Plan: 1. Functional deficits secondary to end stage OA s/p b/l THA 2. DVT Prophylaxis/Anticoagulation: Pharmaceutical: Lovenox. Dopplers negative.  - Resume ASA at discharge.  3. Pain Management:  oxy cr reduce to  q12, oxy ir  q4 prn, wean off long acting prior to d/c  -changed robaxin to  qid scheduled  -q2 hour ice, add Sports creme, will schedule  -sleep/anxiety rx 4. Mood: LCSW to follow for evaluation and support.  5. Neuropsych: This patient is capable of making decisions on his own behalf. 6. Skin/Wound Care: foam dressings to hip incisions---may need something more absorptive 7. Fluids/Electrolytes/Nutrition: Monitor I/O. Offer supplements between meals.   -continue to supplement potassium  -hyponatremia-- 8. ABLA:   Added iron supplement. hgb at 9.7, expect resoloution over time   9. AKI: resolved, BUN 11, Creat 1.0 10. Leucocytosis: Monitor for signs of infection.wbc down to 14.9 today  11. Resting tachycardia: Question due to deconditioning and pain. Monitor for symptoms with activity 12. Constipation: adjust laxatives--enema today 13.  Prob GERD will add protonix, elevate HOB 14.  Intermittent sweating, afebrile but WBCs are up, no symptoms of infx will recheck CBC, check BC, Urine, TSH LOS (Days) 7 A FACE TO FACE EVALUATION WAS PERFORMED  Claudette Laws E 04/16/2015 7:41 AM

## 2015-04-16 NOTE — Progress Notes (Signed)
Physical Therapy Session Note  Patient Details  Name: Spencer Fischer MRN: [id number] Date of Birth: 09/17/1965  Today's Date: 04/16/2015 PT Individual Time: 1300-1430 PT Individual Time Calculation (min): 90 min   Short Term Goals: Week 1:  PT Short Term Goal 1 (Week 1): STGs = LTGs due to ELOS  Skilled Therapeutic Interventions/Progress Updates:    Pt received on EOB with male friend present. Gait Training - PT instructs pt in ambulation from room to gym with RW req SBA for safety, verbal cues to tuck bottom and for upright posture. Stair Training - pt completes corner staircase in gym with B rails in step over step pattern and SBA. PT transitions to pt using a SPC to ambulate on level surface req CGA-min A for balance and increase in lateral trunk deviation noted. PT places compliant mat on ground and pt ambulates over it with RW req SBA and SPC req min A. Therapeutic Activity - PT instructs pt in car transfer with RW and SPC req SBA in both situations. TranSit car set at lowest height to simulate if pt gets a ride from a friend with a low car. See function tab for details re: mat mobility and transfers. Therapeutic Exercise - PT instructs pt in B LE AROM and strengthening exercises - heel slides, supine hip abduction/adduction, bridges, hip IR in B hook lie, clam shells: 3 x 10 reps each. Pt ended in bed with all needs in reach. Continue per PT POC and continue practicing functional mobility with SPC.   Therapy Documentation Precautions:  Precautions Precautions: Fall Restrictions Weight Bearing Restrictions: Yes RLE Weight Bearing: Weight bearing as tolerated LLE Weight Bearing: Weight bearing as tolerated  Pain: Pain Assessment Pain Assessment: 0-10 Pain Score: 8  Pain Type: Acute pain;Surgical pain Pain Location: Hip Pain Orientation: Right;Left Pain Descriptors / Indicators: Sore;Aching Pain Onset: On-going Pain Intervention(s): Medication (See eMAR);Rest Multiple Pain Sites:  No   See Function Navigator for Current Functional Status.   Therapy/Group: Individual Therapy  Kerry Chisolm M 04/16/2015, 1:12 PM

## 2015-04-16 NOTE — Progress Notes (Signed)
Social Work Patient ID: Spencer Fischer, male   DOB: 1965-12-15, 49 y.o.   MRN: [id number] Met with pt to discuss his plans at discharge.  He is working on an apartment for himself.  He states: " You all are kicking me out, I should be able to stay six weeks." He is somewhat unrealistic regarding his rehab stay and care needs. He will be at a mod/i level at discharge. His friend is here and sitting in the recliner all covered up due to she is cold  In pt's cold room. Asked her to participate in his therapies if she will be assisting him at home.  She is aware she should be wearing gown and gloves in a contact room, especially with her trach And her need to cover it with her hand to talk.

## 2015-04-17 ENCOUNTER — Inpatient Hospital Stay (HOSPITAL_COMMUNITY): Payer: Medicaid Other | Admitting: Occupational Therapy

## 2015-04-17 ENCOUNTER — Inpatient Hospital Stay (HOSPITAL_COMMUNITY): Payer: Medicaid Other | Admitting: Physical Therapy

## 2015-04-17 NOTE — Progress Notes (Signed)
Physical Therapy Session Note  Patient Details  Name: Spencer Fischer L Doland MRN: [id number] Date of Birth: 06/24/1965  Today's Date: 04/17/2015 PT Individual Time: 1100-1200 PT Individual Time Calculation (min): 60 min   Short Term Goals: Week 1:  PT Short Term Goal 1 (Week 1): STGs = LTGs due to ELOS  Skilled Therapeutic Interventions/Progress Updates:    Pt receivedi n bed - agreeable to PT session. Therapeutic Activity - all transfers with RW done mod I. SBA for all transfers [facility]. See function tab for details. Gait Training - Pt ambulates with RW mod I in controlled environment. PT transitions and has pt ambulate with SPC req close SBA - antalgic gait noted with increased steppage gait noted - verbal cues to squeeze stomach muscles to reduce extra lateral trunk lean. Neuromuscular Reeducation: PT instructs pt in high level dynamic standing balance activities with SPC: side stepping R/L, backwards walking - req CGA for safety. Therapeutic Exercise - PT places pt on Nu-Step for B LE AROM, strength, and improving activity tolerance x 10 minutes at L5 x 10 minutes. Pt is tolerating increased practice with lesser restrictive assistive device, but this increases discomfort in L hip. Pt will continue to benefit from RW at d/c for utmost safety. Pt ended on eob with all needs in reach. Continue per PT POC.   Therapy Documentation Precautions:  Precautions Precautions: Fall Restrictions Weight Bearing Restrictions: Yes RLE Weight Bearing: Weight bearing as tolerated LLE Weight Bearing: Weight bearing as tolerated  Pain: Pain Assessment Pain Assessment: 0-10 Pain Score: 8  Pain Type: Surgical pain Pain Location: Hip Pain Orientation: Left;Right Pain Descriptors / Indicators: Sore Pain Frequency: Intermittent Pain Onset: On-going Pain Intervention(s): Rest;Emotional support;Distraction Multiple Pain Sites: No   See Function Navigator for Current Functional Status.   Therapy/Group:  Individual Therapy  Prisca Gearing M 04/17/2015, 11:17 AM

## 2015-04-17 NOTE — Progress Notes (Signed)
Occupational Therapy Session Note  Patient Details  Name: Spencer Fischer MRN: [id number] Date of Birth: 06/01/1966  Today's Date: 04/17/2015 OT Individual Time: 1191-47820830-0930 OT Individual Time Calculation (min): 60 min    Skilled Therapeutic Interventions/Progress Updates: Pt. Participated in shower in room via long handled hose and shower chair.  His focus was functional ambulation to gather items for shower and clothing, as well, as dynamic standin balance in shower.   Patient was able to complete shower safely seated first and then standing for periarea.   He is able to utilize sponge for washing lower body.    Patient asked for ice packs for both surgical areas at end of session.  Patient's request was granted.    Therapy Documentation Precautions:  Precautions Precautions: Fall Restrictions Weight Bearing Restrictions: Yes RLE Weight Bearing: Weight bearing as tolerated LLE Weight Bearing: Weight bearing as tolerated  Pain: 8/10  RN had already given meds prior to session Pain Assessment Pain Assessment: 0-10 Pain Score: 8  Pain Type: Surgical pain Pain Location: Hip Pain Orientation: Right;Left Pain Descriptors / Indicators: Aching Pain Frequency: Intermittent Pain Onset: On-going Pain Intervention(s): Medication (See eMAR) Multiple Pain Sites: No See Function Navigator for Current Functional Status.   Therapy/Group: Individual Therapy  Spencer Fischer, Spencer Fischer [facility] CenterYe[facility] 04/17/2015, 12:22 PM

## 2015-04-17 NOTE — Progress Notes (Signed)
  [place] PHYSICAL MEDICINE & REHABILITATION     PROGRESS NOTE    Subjective/Complaints: Has some discomfort from staple- groin area bilaterally    Objective: Vital Signs: Blood pressure 128/79, pulse 104, temperature 98.6 F (37 C), temperature source Oral, resp. rate 18, height 5\' 11"  (1.803 m), weight 239 lb 8 oz (108.636 kg), SpO2 99 %.   nad Chest- cta cv- reg rate abd- soft, nt, +BS Ext- no edema Assessment/Plan: 1. Functional deficits secondary to oa hips s/p bilateral THA's  Medical Problem List and Plan: 1. Functional deficits secondary to end stage OA s/p b/l THA 2. DVT Prophylaxis/Anticoagulation: Pharmaceutical: Lovenox. Dopplers negative.  - Resume ASA at discharge.  3. Pain Management: - reasonably well controlled currently on meds 4. Mood: LCSW to follow for evaluation and support.  5. Neuropsych: This patient is capable of making decisions on his own behalf. 6. Skin/Wound Care: foam dressings to hip incisions---may need something more absorptive 7. Fluids/Electrolytes/Nutrition:  Basic Metabolic Panel:    Component Value Date/Time   NA 135 04/14/2015 0403   K 3.9 04/14/2015 0403   CL 95* 04/14/2015 0403   CO2 29 04/14/2015 0403   BUN 13 04/14/2015 0403   CREATININE 0.94 04/14/2015 0403   GLUCOSE 110* 04/14/2015 0403   CALCIUM 9.4 04/14/2015 0403   Hyponatremia- resolved 8. ABLA:   Added iron supplement.     Lab Results  Component Value Date   HGB 9.2* 04/16/2015    9. AKI: resolved, BUN 11, Creat 1.0 10. Leucocytosis:  Lab Results  Component Value Date   WBC 15.6* 04/16/2015   11. Resting tachycardia: Question due to deconditioning and pain. Monitor for symptoms with activity 12. Constipation: had BM yesterday 13.  Prob GERD will add protonix, elevate HOB 14.  Intermittent sweating, afebrile but WBCs are up, no symptoms of infx will recheck CBC, check BC, Urine, TSH LOS (Days) 8 A FACE TO FACE EVALUATION WAS  PERFORMED  [facility] HospitalWOR[facility],Zanya Lindo HENRY 04/17/2015 7:54 AM

## 2015-04-18 ENCOUNTER — Inpatient Hospital Stay (HOSPITAL_COMMUNITY): Payer: Medicaid Other | Admitting: Occupational Therapy

## 2015-04-18 MED ORDER — CLONAZEPAM 0.5 MG PO TABS
0.5000 mg | ORAL_TABLET | Freq: Two times a day (BID) | ORAL | Status: DC | PRN
Start: 2015-04-18 — End: 2015-04-20
  Administered 2015-04-19 (×2): 0.5 mg via ORAL
  Filled 2015-04-18 (×2): qty 1

## 2015-04-18 NOTE — Progress Notes (Signed)
  [place] PHYSICAL MEDICINE & REHABILITATION     PROGRESS NOTE    Subjective/Complaints: Has some discomfort from staple- groin area bilaterally- Complains of difficulty sleeping  Objective: Vital Signs: Blood pressure 138/81, pulse 92, temperature 98.6 F (37 C), temperature source Oral, resp. rate 18, height 5\' 11"  (1.803 m), weight 239 lb 8 oz (108.636 kg), SpO2 100 %.   nad Chest- cta cv- reg rate abd- soft, nt, +BS Ext- no edema peripherally. There is some edema around staples bilaterally L>R Assessment/Plan: 1. Functional deficits secondary to oa hips s/p bilateral THA's  Medical Problem List and Plan: 1. Functional deficits secondary to end stage OA s/p b/l THA Some swelling around staples but no s/s of infection 2. DVT Prophylaxis/Anticoagulation: Pharmaceutical: Lovenox. Dopplers negative.  - Resume ASA at discharge.  3. Pain Management: - reasonably well controlled currently on meds 4. Mood: LCSW to follow for evaluation and support.  5. Neuropsych: This patient is capable of making decisions on his own behalf. 6. Skin/Wound Care: foam dressings to hip incisions---may need something more absorptive 7. Fluids/Electrolytes/Nutrition:  Basic Metabolic Panel:    Component Value Date/Time   NA 135 04/14/2015 0403   K 3.9 04/14/2015 0403   CL 95* 04/14/2015 0403   CO2 29 04/14/2015 0403   BUN 13 04/14/2015 0403   CREATININE 0.94 04/14/2015 0403   GLUCOSE 110* 04/14/2015 0403   CALCIUM 9.4 04/14/2015 0403   Hyponatremia- resolved 8. ABLA:   Added iron supplement.     Lab Results  Component Value Date   HGB 9.2* 04/16/2015    9. AKI: resolved, BUN 11, Creat 1.0 10. Leucocytosis:  Lab Results  Component Value Date   WBC 15.6* 04/16/2015  no s/s of infection 11. Resting tachycardia: Question due to deconditioning and pain. Monitor for symptoms with activity 12. Constipation: had BM yesterday 13.  Prob GERD will add protonix, elevate HOB 14.   Intermittent sweating, afebrile but WBCs are up, no symptoms of infx will recheck CBC, check BC, Urine, TSH LOS (Days) 9 A FACE TO FACE EVALUATION WAS PERFORMED  SWORDS,BRUCE HENRY 04/18/2015 8:14 AM

## 2015-04-18 NOTE — Progress Notes (Signed)
Occupational Therapy Session Note  Patient Details  Name: Graylon GoodKevin L Abdon MRN: [id number] Date of Birth: 06/03/1966  Today's Date: 04/18/2015 OT Individual Time:  -   1000-1100  (60 min)      Short Term Goals: Week 1:  OT Short Term Goal 1 (Week 1): Pt. will bathe self at mod I level OT Short Term Goal 2 (Week 1): Pt will dress self at mod I level OT Short Term Goal 3 (Week 1): Pt will transfer self to toilet at mod I level  OT Short Term Goal 4 (Week 1): pt will perform shower trransfer with sba Week 2:     Skilled Therapeutic Interventions/Progress Updates:    Engaged in therapeutic activities at RW level Pt engaged in shower using tub bench.  He  ret[facility]hest [facility].  Transferred to toilet and then to shower with RW; no LOB.  Pt.bathed and dressed using AE with no instructional cuesl. Left pt in bed with all needs in place.      Therapy Documentation Precautions:  Precautions Precautions: Fall Restrictions Weight Bearing Restrictions: Yes RLE Weight Bearing: Weight bearing as tolerated LLE Weight Bearing: Weight bearing as tolerated   Pain: Pain Assessment Pain Score: 5/10      See Function Navigator for Current Functional Status.   Therapy/Group: Individual Therapy  Humberto Sealsdwards, [place] Adcock J 04/18/2015, 8:04 AM

## 2015-04-19 ENCOUNTER — Inpatient Hospital Stay (HOSPITAL_COMMUNITY): Payer: Medicaid Other

## 2015-04-19 ENCOUNTER — Inpatient Hospital Stay (HOSPITAL_COMMUNITY): Payer: Medicaid Other | Admitting: Occupational Therapy

## 2015-04-19 LAB — CBC WITH DIFFERENTIAL/PLATELET
BASOS ABS: 0 10*3/uL (ref 0.0–0.1)
BASOS PCT: 0 %
EOS ABS: 0.2 10*3/uL (ref 0.0–0.7)
Eosinophils Relative: 1 %
HCT: 29.8 % — ABNORMAL LOW (ref 39.0–52.0)
HEMOGLOBIN: 9.8 g/dL — AB (ref 13.0–17.0)
Lymphocytes Relative: 21 %
Lymphs Abs: 3.2 10*3/uL (ref 0.7–4.0)
MCH: 29.7 pg (ref 26.0–34.0)
MCHC: 32.9 g/dL (ref 30.0–36.0)
MCV: 90.3 fL (ref 78.0–100.0)
MONOS PCT: 7 %
Monocytes Absolute: 1.2 10*3/uL — ABNORMAL HIGH (ref 0.1–1.0)
NEUTROS PCT: 71 %
Neutro Abs: 11 10*3/uL — ABNORMAL HIGH (ref 1.7–7.7)
Platelets: 545 10*3/uL — ABNORMAL HIGH (ref 150–400)
RBC: 3.3 MIL/uL — ABNORMAL LOW (ref 4.22–5.81)
RDW: 14.1 % (ref 11.5–15.5)
WBC: 15.6 10*3/uL — AB (ref 4.0–10.5)

## 2015-04-19 MED ORDER — NICOTINE 7 MG/24HR TD PT24
7.0000 mg | MEDICATED_PATCH | Freq: Every day | TRANSDERMAL | Status: DC
  Administered 2015-04-19: 7 mg via TRANSDERMAL
  Filled 2015-04-19 (×3): qty 1

## 2015-04-19 NOTE — Progress Notes (Signed)
Social Work Patient ID: Spencer Fischer, male   DOB: 1965/08/19, 49 y.o.   MRN: [id number] Met with pt and his friend also in the room to discuss discharge tomorrow. He wanted to know if the hospital could convince landlord to vouch for him in regards to the rent. Informed him to go through DSS for rental assistance and the hospital would not vouch for him.  He has been informed he is ready to go tomorrow and he needs to come up with where he is going. He states he may go to Dad's or friends until he can get the apartment he wants. Offered him the shelter also. Will order his equipment and follow up care.

## 2015-04-19 NOTE — Progress Notes (Signed)
Physical Therapy Session Note  Patient Details  Name: Graylon GoodKevin L Handler MRN: [id number] Date of Birth: 07/29/1965  Today's Date: 04/19/2015 PT Individual Time: 1100-1155 PT Individual Time Calculation (min): 55 min   Short Term Goals: Week 1:  PT Short Term Goal 1 (Week 1): STGs = LTGs due to ELOS  Skilled Therapeutic Interventions/Progress Updates:    Session focused on addressing Grad day activities to prepare for discharge tomorrow. Pt modified independent with RW for all mobility and practiced and reviewed bed mobility on flat bed, transfers from various heights of surfaces including low couch and simulated car (sedan and SUV height), stair negotiation with rails, compliant surface ambulation to simulate community environment, up/down ramp and curb step for community mobility training, and reviewed HEP which patient able to perform using handouts. Made patient modified independent with RW in room and notified RN. Pt denies any concerns in regards to d/c tomorrow. See d/c summary for complete details.  Therapy Documentation Precautions:  Precautions Precautions: Fall Restrictions Weight Bearing Restrictions: Yes RLE Weight Bearing: Weight bearing as tolerated LLE Weight Bearing: Weight bearing as tolerated  Pain: Notified RN for pain medication for hips - unrated.    See Function Navigator for Current Functional Status.   Therapy/Group: Individual Therapy  Karolee StampsGray, [place] Marsico [facility] Hospital[facility]rescia 04/19/2015, 12:22 PM

## 2015-04-19 NOTE — Progress Notes (Signed)
Physical Therapy Discharge Summary  Patient Details  Name: Spencer Fischer MRN: [id number] Date of Birth: 1965/07/09  Patient has met 11 of 11 long term goals due to improved activity tolerance, improved balance, increased strength, increased range of motion, decreased pain, ability to compensate for deficits and functional use of  right lower extremity and left lower extremity.  Patient to discharge at an ambulatory level Modified Independent using a RW.  Reasons goals not met: n/a - all goals met.  Recommendation:  Patient will benefit from ongoing skilled PT services in home health setting to continue to advance safe functional mobility, address ongoing impairments in strength, balance, endurance, gait, functional mobility, ROM, and minimize fall risk.  Equipment: RW  Reasons for discharge: treatment goals met and discharge from hospital  Patient/family agrees with progress made and goals achieved: Yes  PT Discharge Precautions/Restrictions Restrictions RLE Weight Bearing: Weight bearing as tolerated LLE Weight Bearing: Weight bearing as tolerated Sensation Sensation Light Touch:  (reports numbness near L incision site) Proprioception: Appears Intact Coordination Gross Motor Movements are Fluid and Coordinated: Yes Motor  Motor Motor: Within Functional Limits     Trunk/Postural Assessment  Cervical Assessment Cervical Assessment: Within Functional Limits Thoracic Assessment Thoracic Assessment: Within Functional Limits Lumbar Assessment Lumbar Assessment: Within Functional Limits  Balance Static Sitting Balance Static Sitting - Level of Assistance: 6: Modified independent (Device/Increase time) Dynamic Sitting Balance Dynamic Sitting - Level of Assistance: 6: Modified independent (Device/Increase time) Static Standing Balance Static Standing - Level of Assistance: 6: Modified independent (Device/Increase time) Dynamic Standing Balance Dynamic Standing - Level of  Assistance: 6: Modified independent (Device/Increase time) Extremity Assessment      RLE Assessment RLE Assessment: Exceptions to [facility] RLE Strength RLE Overall Strength Comments: hip flexion 3-/5; knee and ankle 4+/5 LLE Assessment LLE Assessment: Exceptions to [facility] LLE Strength LLE Overall Strength Comments: hip flexion 3-/5; knee and ankle 4+/5   See Function Navigator for Current Functional Status.  Canary Brim Ivory Broad, PT, DPT  04/19/2015, 12:27 PM

## 2015-04-19 NOTE — Progress Notes (Signed)
Occupational Therapy Progress Note  Patient Details  Name: Spencer Fischer MRN: [id number] Date of Birth: 02/10/1966  Today's Date: 04/19/2015 OT Individual Time 0962-8366 75 min Missed Time: 15 min d/t fatigue  OT Short Term Goals Week 1:  OT Short Term Goal 1 (Week 1): Pt. will bathe self at mod I level OT Short Term Goal 1 - Progress (Week 1): Met OT Short Term Goal 2 (Week 1): Pt will dress self at mod I level OT Short Term Goal 2 - Progress (Week 1): Met OT Short Term Goal 3 (Week 1): Pt will transfer self to toilet at mod I level  OT Short Term Goal 3 - Progress (Week 1): Met OT Short Term Goal 4 (Week 1): pt will perform shower trransfer with sba OT Short Term Goal 4 - Progress (Week 1): Met  Skilled Therapeutic Interventions/Progress Updates: ADL-retraining with focus on improved performance with use of AE during BADL, discharge planning, endurance, transfers, functional mobility and dynamic standing balance.  Pt re-educated on planned discharge 04/20/15 and now reports plan to reside with his mother while awaiting acceptance to his own apartment within his mother's apartment complex.   Pt able to retrieve items using walker, shower seated on shower chair, using LH sponge to reach back and feet.   Pt dresses at edge of bed unassisted using reacher and sock aid.   Pt ambulates within room using RW and manages device skillfully.   Pt grooms standing at sink unassisted.    Girlfriend present during session although disinterested in review of pt skills as she plans no role in assisting pt in self-care supervision or setup d/t history of conflict with pt's mother, per pt.    Pt affirms need for shower chair and bedside commode, s/p discharge and he maintains contacts with associations that he claims will assist with furniture needs when he moves to his own apartment.        Therapy Documentation Precautions:  Precautions Precautions: Fall Restrictions Weight Bearing Restrictions: Yes RLE  Weight Bearing: Weight bearing as tolerated LLE Weight Bearing: Weight bearing as tolerated  Vital Signs: Therapy Vitals Temp: 99.1 F (37.3 C) Temp Source: Oral Pulse Rate: 98 Resp: 17 BP: 138/80 mmHg Patient Position (if appropriate): Lying Oxygen Therapy SpO2: 100 % O2 Device: Not Delivered   Pain: Pain Assessment Pain Assessment: 0-10 Pain Score: 5  Pain Type: Neuropathic pain Pain Location: Hip Pain Orientation: Right;Left Pain Radiating Towards: thighs Pain Descriptors / Indicators: Aching;Numbness;Sore Pain Frequency: Intermittent Pain Onset: With Activity Patients Stated Pain Goal: 4 Pain Intervention(s): Medication (See eMAR)   See Function Navigator for Current Functional Status.   Therapy/Group: Individual Therapy  BARTHOLD,FRANK 04/19/2015, 3:12 PM

## 2015-04-19 NOTE — Progress Notes (Addendum)
[place] PHYSICAL MEDICINE & REHABILITATION     PROGRESS NOTE    Subjective/Complaints: Patient feels good today he is ready to go home. Discussed his need to follow-up with orthopedics as well as primary care   ROS: Pt denies fever, rash/itching, headache, blurred or double vision, nausea, vomiting, abdominal pain, diarrhea, chest pain, shortness of breath,    Objective: Vital Signs: Blood pressure 152/88, pulse 101, temperature 98.7 F (37.1 C), temperature source Oral, resp. rate 18, height 5\' 11"  (1.803 m), weight 108.636 kg (239 lb 8 oz), SpO2 100 %. No results found.  Recent Labs  04/16/15 0922  WBC 15.6*  HGB 9.2*  HCT 28.5*  PLT 502*   No results for input(s): NA, K, CL, GLUCOSE, BUN, CREATININE, CALCIUM in the last 72 hours.  Invalid input(s): CO CBG (last 3)  No results for input(s): GLUCAP in the last 72 hours.  Wt Readings from Last 3 Encounters:  04/14/15 108.636 kg (239 lb 8 oz)  04/06/15 104.781 kg (231 lb)  03/26/15 104.962 kg (231 lb 6.4 oz)    Physical Exam:  Constitutional: He is oriented to person, place, and time. He appears well-developed and well-nourished.  HENT: throat clear, large tonsils Head: Normocephalic and atraumatic.  Eyes: Conjunctivae and EOM are normal.  Neck: Normal range of motion. Neck supple.  Cardiovascular: Normal rate and regular rhythm.  No murmur heard. No rubs Respiratory: Effort normal and breath sounds normal. No respiratory distress.  GI: Soft. Bowel sounds are normal. He exhibits no distension.  Musculoskeletal: He exhibits edema and tenderness.  Strength b/l UE 5/5 grossly B/l LE hip flex 3-/5 grossly (limited by pain), 5/5 ankle dorsi/plantar flexion  Neurological: He is alert and oriented to person, place, and time.  Skin: Skin is warm and dry.  Incisions clean,no drainage Psychiatric: He has a normal mood and affect. His behavior is normal.   Assessment/Plan: 1. Functional deficits secondary to oa  hips s/p bilateral THA's Stable for D/C today F/u PCP in 1-2 weeks F/u Ortho 1 weeks See D/C summary See D/C instructions   Function:  Bathing Bathing position   Position: Shower  Bathing parts Body parts bathed by patient: Right arm, Left arm, Chest, Abdomen, Front perineal area, Buttocks, Right upper leg, Left upper leg, Right lower leg, Left lower leg, Back Body parts bathed by helper: Back  Bathing assist Assist Level: Set up   Set up : To obtain items  Upper Body Dressing/Undressing Upper body dressing   What is the patient wearing?: Pull over shirt/dress     Pull over shirt/dress - Perfomed by patient: Thread/unthread right sleeve, Thread/unthread left sleeve, Put head through opening, Pull shirt over trunk          Upper body assist Assist Level: Set up   Set up : To obtain clothing/put away  Lower Body Dressing/Undressing Lower body dressing   What is the patient wearing?: Underwear, Pants Underwear - Performed by patient: Thread/unthread right underwear leg, Thread/unthread left underwear leg, Pull underwear up/down   Pants- Performed by patient: Thread/unthread right pants leg, Thread/unthread left pants leg, Pull pants up/down, Fasten/unfasten pants Pants- Performed by helper: Thread/unthread right pants leg, Thread/unthread left pants leg, Pull pants up/down, Fasten/unfasten pants Non-skid slipper socks- Performed by patient: Don/doff right sock, Don/doff left sock Non-skid slipper socks- Performed by helper: Don/doff right sock, Don/doff left sock     Shoes - Performed by patient: Don/doff right shoe Shoes - Performed by helper: Don/doff right shoe, Don/doff left  shoe       TED Hose - Performed by helper: Don/doff right TED hose, Don/doff left TED hose  Lower body assist Assist for lower body dressing: Set up Assistive Device Comment: sock aid and reacher    Toileting Toileting Toileting activity did not occur: N/A (patient voiced no need to during  this session) Toileting steps completed by patient: Adjust clothing prior to toileting, Performs perineal hygiene, Adjust clothing after toileting Toileting steps completed by helper: Adjust clothing prior to toileting Toileting Assistive Devices: Grab bar or rail  Toileting assist Assist level: Set up/obtain supplies, Supervision or verbal cues   Transfers Chair/bed transfer   Chair/bed transfer method: Ambulatory Chair/bed transfer assist level: Supervision or verbal cues (SBA with SPC; mod I with RW) Chair/bed transfer assistive device: Ephraim Hamburger     Locomotion Ambulation     Max distance: 200' Assist level: Supervision or verbal cues (SBA with SPC; mod I with RW)   Wheelchair   Type: Manual Max wheelchair distance: 150' Assist Level: No help, No cues, assistive device, takes more than reasonable amount of time  Cognition Comprehension Comprehension assist level: Follows complex conversation/direction with no assist  Expression Expression assist level: Expresses complex ideas: With no assist  Social Interaction Social Interaction assist level: Interacts appropriately with others with medication or extra time (anti-anxiety, antidepressant).  Problem Solving Problem solving assist level: Solves basic 90% of the time/requires cueing < 10% of the time  Memory Memory assist level: More than reasonable amount of time   Medical Problem List and Plan: 1. Functional deficits secondary to end stage OA s/p b/l THA 2. DVT Prophylaxis/Anticoagulation: Pharmaceutical: DC Lovenox. Dopplers negative.  - Resume ASA at discharge.  3. Pain Management: oxy cr d/c, oxy ir  q4 prn,   -changed robaxin to  qid scheduled  -q2 hour ice, add Sports creme, will schedule  -sleep/anxiety rx 4. Mood: LCSW to follow for evaluation and support.  5. Neuropsych: This patient is capable of making decisions on his own behalf. 6. Skin/Wound Care: foam dressings to hip incisions---may need  something more absorptive 7. Fluids/Electrolytes/Nutrition: Monitor I/O. Offer supplements between meals.   -continue to supplement potassium  -hyponatremia-- 8. ABLA:   Added iron supplement. hgb at 9.2, expect resoloution over time   9. AKI: resolved, BUN 11, Creat 1.0 10. Leucocytosis: febrile with no signs of infection WBCs stable at 15, follow-up with PCP    LOS (Days) 10 A FACE TO FACE EVALUATION WAS PERFORMED  Erick Colace 04/19/2015 7:44 AM

## 2015-04-19 NOTE — Progress Notes (Signed)
Occupational Therapy Session Note  Patient Details  Name: Spencer Fischer MRN: [id number] Date of Birth: 07/27/1965  Today's Date: 04/19/2015 OT Individual Time: 1400-1453 OT Individual Time Calculation (min): 53 min    Skilled Therapeutic Interventions/Progress Updates:    Pt performed UE strengthening exercises using the Paragym and theraband.  Completed 3 sets 10 repetitions for lat pull down, tricep extension, and bicep curl.  He completed 1 set each of shoulder flexion, abduction, and horizontal abduction using the blue theraband. Pt complete all exercises in sitting with modified independence for transfer to and from the room with the RW.    Therapy Documentation Precautions:  Precautions Precautions: Fall Restrictions Weight Bearing Restrictions: No RLE Weight Bearing: Weight bearing as tolerated LLE Weight Bearing: Weight bearing as tolerated  Pain: Pain Assessment Pain Assessment: 0-10 Pain Score: 6  Pain Type: Acute pain Pain Location: Hip Pain Orientation: Right;Left Pain Intervention(s): Repositioned ADL: See Function Navigator for Current Functional Status.   Therapy/Group: Individual Therapy  Hadlie Gipson OTR/L 04/19/2015, 4:31 PM

## 2015-04-19 NOTE — Progress Notes (Signed)
Calls frequently for various things. Requesting Oxy IR every 4 hours. Girlfriend stayed the night. She doesn't wear PPE, after being educated. Patient reports, "I'm not going home until my hip is right.". Alfredo MartinezMurray, Jahfari Ambers A

## 2015-04-20 MED ORDER — OXYCODONE HCL 5 MG PO TABS: 5.0000 mg | ORAL_TABLET | Freq: Four times a day (QID) | ORAL | Status: DC | PRN

## 2015-04-20 MED ORDER — ASPIRIN 325 MG PO TBEC: 325.0000 mg | DELAYED_RELEASE_TABLET | Freq: Two times a day (BID) | ORAL | Status: DC

## 2015-04-20 MED ORDER — DOCUSATE SODIUM 100 MG PO CAPS: 100.0000 mg | ORAL_CAPSULE | Freq: Two times a day (BID) | ORAL | Status: DC

## 2015-04-20 MED ORDER — METHOCARBAMOL 500 MG PO TABS: 500.0000 mg | ORAL_TABLET | Freq: Four times a day (QID) | ORAL | Status: DC | PRN

## 2015-04-20 MED ORDER — TRAMADOL HCL 50 MG PO TABS: 50.0000 mg | ORAL_TABLET | Freq: Four times a day (QID) | ORAL | Status: DC | PRN

## 2015-04-20 MED ORDER — ACETAMINOPHEN 325 MG PO TABS: 325.0000 mg | ORAL_TABLET | ORAL | Status: DC | PRN

## 2015-04-20 MED ORDER — FAMOTIDINE 20 MG PO TABS: 20.0000 mg | ORAL_TABLET | Freq: Two times a day (BID) | ORAL | Status: DC

## 2015-04-20 MED ORDER — POLYSACCHARIDE IRON COMPLEX 150 MG PO CAPS: 150.0000 mg | ORAL_CAPSULE | Freq: Two times a day (BID) | ORAL | Status: DC

## 2015-04-20 MED ORDER — MUSCLE RUB 10-15 % EX CREA: 2.0000 "application " | TOPICAL_CREAM | Freq: Two times a day (BID) | CUTANEOUS | Status: DC

## 2015-04-20 MED ORDER — SENNA 8.6 MG PO TABS: 2.0000 | ORAL_TABLET | Freq: Two times a day (BID) | ORAL | Status: DC

## 2015-04-20 MED ORDER — OXYCODONE HCL 5 MG PO TABS
5.0000 mg | ORAL_TABLET | Freq: Four times a day (QID) | ORAL | Status: DC | PRN
Start: 2015-04-20 — End: 2015-04-20

## 2015-04-20 MED ORDER — NICOTINE 7 MG/24HR TD PT24: 7.0000 mg | MEDICATED_PATCH | Freq: Every day | TRANSDERMAL | Status: DC

## 2015-04-20 NOTE — Discharge Summary (Signed)
Physician Discharge Summary  Patient ID: Spencer Fischer MRN: [id number] DOB/AGE: 47-Sep-1967 47 y.o.  Admit date: 04/09/2015 Discharge date: 04/20/2015  Discharge Diagnoses:  Principal Problem:   Arthritis of both hips Active Problems:   Status post bilateral hip replacements   GERD (gastroesophageal reflux disease)   Hypokalemia   Discharged Condition:  Stable   Significant Diagnostic Studies: Dg Chest 2 View  04/19/2015  CLINICAL DATA:  Elevated white blood cell count. Fever. Recent bilateral hip replacements. EXAM: CHEST  2 VIEW COMPARISON:  11/04/2008 FINDINGS: The heart size and mediastinal contours are within normal limits. Both lungs are clear. No pleural effusion or pneumothorax. The visualized skeletal structures are unremarkable. IMPRESSION: No active cardiopulmonary disease. Electronically Signed   By: Amie Portland M.D.   On: 04/19/2015 13:03     Labs:  Basic Metabolic Panel:  Recent Labs Lab 04/14/15 0403  NA 135  K 3.9  CL 95*  CO2 29  GLUCOSE 110*  BUN 13  CREATININE 0.94  CALCIUM 9.4    CBC:  Recent Labs Lab 04/14/15 0403 04/16/15 0922 04/19/15 0834  WBC 15.5* 15.6* 15.6*  NEUTROABS  --  10.8* 11.0*  HGB 9.4* 9.2* 9.8*  HCT 28.6* 28.5* 29.8*  MCV 90.2 90.8 90.3  PLT 432* 502* 545*    CBG: No results for input(s): GLUCAP in the last 168 hours.  Brief HPI:   Spencer Fischer is a 49 y.o. male with history HTN, anxiety, progressive hip pain due to OA for the past 5 years and failure of conservative therapy. His pain was 10/10 in severity and he had associated difficulties with ambulation. He failed conservative treatment and elected to undergo B-THR on 04/06/15 by Dr. Magnus Ivan. Post-op course complicated by pain, tachycardia, AKI as well as ABLA. Today, pain is 7/10, non-radiating, and improved with medications. He denies associated numbness/tingling. Therapy ongoing and CIR was recommended by MD and PT for follow up therapy.    Physical Exam:   Constitutional: He is oriented to person, place, and time. He appears well-developed and well-nourished.  HENT:  Throat clear. Borderline dentition.  Head: Normocephalic and atraumatic.  Eyes: Conjunctivae and EOM are normal.  Neck: Normal range of motion. Neck supple.  Cardiovascular: Normal rate and regular rhythm.  No murmur heard. No rubs Respiratory: Effort normal and breath sounds normal. No respiratory distress.  GI: Soft. Non-tender. Normal bowel sounds without distension.  Musculoskeletal: He exhibits edema and tenderness.  Strength b/l UE 5/5 grossly B/l LE hip flex 3-/5 grossly (limited by pain), 5/5 ankle dorsi/plantar flexion  Neurological: He is alert and oriented to person, place, and time.  Skin: Skin is warm and dry.  Incisions are clean, intact and no drainage noted.  Psychiatric: He has a normal mood and affect. His behavior is normal.    Hospital Course: Spencer Fischer was admitted to rehab 04/09/2015 for inpatient therapies to consist of PT  and OT at least three hours five days a week. Past admission physiatrist, therapy team and rehab RN have worked together to provide customized collaborative inpatient rehab.  Blood pressures have been stable.  He was started on iron supplement for ABLA and H/H has been stable. He continued to have elevated WBC without signs of infection. He has been afebrile during his stay. Work included UA, blood cultures X 2 as well as CXR which has been negative for infection. Bilateral hip incisions are healing well without erythema or drainage. Narcotics have been weaned as pain control has  imporved. He was advised on using ice and alternating ultram with oxycodone for further wean after discharge.   During patient's stay in rehab weekly team conferences were held to monitor patient's progress, set goals and discuss barriers to discharge. At admission, patient required moderate assistance with ADL tasks and min assist with mobility.  He  has had improvement in activity tolerance, balance, postural control, as well as ability to compensate for deficits. He is able to complete ADL tasks independently. He is modified independent for transfers and mobility. He is ambulating 200' with RW at modified independent level and [facility]  Lovenox was used for DVT prophylaxis during his rehab stay and he is to transition to ASA 325 mg bid after discharge. He [facility]     Disposition:  Home   Diet: Regular  Special Instructions: 1. Use walker. Continue HEP. 2.  Wean pain medications over next 1-2 weeks. 3. Dr. Magnus IvanBlackman to refill pain medications after discharge.      Medication List    STOP taking these medications        HYDROcodone-acetaminophen 5-325 MG tablet  Commonly known as:  NORCO/VICODIN      TAKE these medications        acetaminophen 325 MG tablet  Commonly known as:  TYLENOL  Take 1-2 tablets (325-650 mg total) by mouth every 4 (four) hours as needed for mild pain.     aspirin 325 MG EC tablet  Take 1 tablet (325 mg total) by mouth 2 (two) times daily after a meal.     docusate sodium 100 MG capsule  Commonly known as:  COLACE  Take 1 capsule (100 mg total) by mouth 2 (two) times daily.     famotidine 20 MG tablet  Commonly known as:  PEPCID  Take 1 tablet (20 mg total) by mouth 2 (two) times daily.     iron polysaccharides 150 MG capsule  Commonly known as:  NIFEREX  Take 1 capsule (150 mg total) by mouth 2 (two) times daily before lunch and supper.     methocarbamol 500 MG tablet--Rx 150 pills/1 refill  Commonly known as:  ROBAXIN  Take 1-2 tablets (500-1,000 mg total) by mouth every 6 (six) hours as needed for muscle spasms.     MUSCLE RUB 10-15 % Crea  Apply 2 application topically 2 (two) times daily with breakfast and lunch.     nicotine 7 mg/24hr patch  Commonly known as:  NICODERM CQ - dosed in mg/24 hr   Place 1 patch (7 mg total) onto the skin daily.     oxyCODONE 5 MG immediate release tablet--Rx # 90 pills   Commonly known as:  Oxy IR/ROXICODONE  Take 1-2 tablets (5-10 mg total) by mouth every 6 (six) hours as needed for severe pain or breakthrough pain.     senna 8.6 MG Tabs tablet  Commonly known as:  SENOKOT  Take 2 tablets (17.2 mg total) by mouth 2 (two) times daily. For constipation     traMADol 50 MG tablet--RX # 75 pills   Commonly known as:  ULTRAM  Take 1 tablet (50 mg total) by mouth every 6 (six) hours as needed for moderate pain. Alternate this with oxycodone.     triamterene-hydrochlorothiazide 37.5-25 MG tablet  Commonly known as:  MAXZIDE-25  Take 1 tablet by mouth daily.       Follow-up Information  Call Erick Colace, MD.   Specialty:  Physical Medicine and Rehabilitation   Why:  As needed   Contact information:   [address] [place] [address] [phone]       Follow up with Kathryne Hitch, MD. Call on 04/28/2015.   Specialty:  Orthopedic Surgery   Why:  Be there at 9 am for follow up appointment    Contact information:   [address] [place] [address] [phone]       Follow up with [facility] On 05/26/2015.   Why:  APPT @ 2:15 PM      Signed: Jacquelynn Cree 04/20/2015, 10:40 AM

## 2015-04-20 NOTE — Progress Notes (Signed)
Social Work  Discharge Note  The overall goal for the admission was met for:   Discharge location: Yes-HOME TO [place]  Length of Stay: Yes-11 DAYS  Discharge activity level: Yes-MOD/I LEVEL  Home/community participation: Yes  Services provided included: MD, RD, PT, OT, RN, CM, TR, Pharmacy and SW  Financial Services: Medicaid  Follow-up services arranged: Home Health: [place], DME: [place], [place] and Patient/Family request agency HH: [facility] OFFICE REF, DME: NO PREF  Comments (or additional information):PT [place]. HE IS WORKING ON GETTING HIS OWN APARTMENT. PLANNING TO STAY AT MOM'S, DAD'S OR FRIENDS.   Patient/Family verbalized understanding of follow-up arrangements: Yes  Individual responsible for coordination of the follow-up plan: SELF  Confirmed correct DME delivered: Elease Hashimoto 04/20/2015    Elease Hashimoto

## 2015-04-20 NOTE — Progress Notes (Signed)
Occupational Therapy Discharge Summary  Patient Details  Name: Spencer Fischer MRN: [id number] Date of Birth: [birth year]   Patient has met 10 of 11 long term goals due to improved activity tolerance and ability to compensate for deficits.  Patient to discharge at overall Modified Independent level.  Patient's care partner unavailable to provide the necessary physical assistance at discharge.    Reasons goals not met:  Due to ambiguous or unspecified discharge environment.  Recommendation:  Patient will not require ongoing skilled OT services to continue to advance functional skills in the area of BADL and iADL.  Equipment: Shower chair, bedside commode  Reasons for discharge: discharge from hospital  Patient/family agrees with progress made and goals achieved: Yes  OT Discharge Precautions/Restrictions  Precautions Precautions: Fall Restrictions Weight Bearing Restrictions: Yes RLE Weight Bearing: Weight bearing as tolerated LLE Weight Bearing: Weight bearing as tolerated General   Vital Signs Therapy Vitals Temp: 98.7 F (37.1 C) Pulse Rate: 100 Resp: 16 BP: 122/80 mmHg Oxygen Therapy SpO2: 100 % Pain Pain Assessment Pain Assessment: 0-10 Pain Score: Asleep Pain Type: Surgical pain Pain Location: Hip Pain Orientation: Right;Left Pain Descriptors / Indicators: Aching Pain Frequency: Intermittent Pain Onset: On-going Patients Stated Pain Goal: 4 Pain Intervention(s): Medication (See eMAR) Multiple Pain Sites: No ADL ADL ADL Comments: see Functional Tool Vision/Perception  Vision- History Baseline Vision/History: Wears glasses Wears Glasses: Reading only Patient Visual Report: No change from baseline Vision- Assessment Vision Assessment?: No apparent visual deficits Perception Comments: appears WFL  Cognition Overall Cognitive Status: Within Functional Limits for tasks assessed Arousal/Alertness: Awake/alert Orientation Level: Oriented X4 Attention:  Divided Divided Attention: Appears intact Memory: Appears intact Awareness: Appears intact Problem Solving: Appears intact Safety/Judgment: Appears intact Sensation Sensation Light Touch: Impaired Detail Light Touch Impaired Details: Impaired RUE;Impaired LUE (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) Stereognosis: Appears Intact Hot/Cold: Appears Intact Proprioception: Appears Intact Additional Comments: intermittent numb/tingly in B hands and B shoulders - pt reports from carpal tunnel Coordination Gross Motor Movements are Fluid and Coordinated: Yes Fine Motor Movements are Fluid and Coordinated: Yes Finger Nose Finger Test: WFL Bilateral UEs Motor  Motor Motor: Within Functional Limits Mobility  Bed Mobility Bed Mobility: Rolling Right;Rolling Left Rolling Right: 6: Modified independent (Device/Increase time) (extra time) Rolling Left: 6: Modified independent (Device/Increase time) (extra time) Supine to Sit: 6: Modified independent (Device/Increase time) (extra time) Sit to Supine: 6: Modified independent (Device/Increase time) (extra time) Transfers Transfers: Sit to Stand;Stand to Sit Sit to Stand: 6: Modified independent (Device/Increase time) Stand to Sit: 6: Modified independent (Device/Increase time)  Trunk/Postural Assessment  Cervical Assessment Cervical Assessment: Within Functional Limits Thoracic Assessment Thoracic Assessment: Within Functional Limits Lumbar Assessment Lumbar Assessment: Within Functional Limits Postural Control Postural Control: Within Functional Limits  Balance Static Sitting Balance Static Sitting - Balance Support: No upper extremity supported;Feet supported Static Sitting - Level of Assistance: 6: Modified independent (Device/Increase time) Dynamic Sitting Balance Dynamic Sitting - Balance Support: No upper extremity supported Dynamic Sitting - Level of Assistance: 6: Modified independent (Device/Increase time) Dynamic Sitting - Balance  Activities: Reaching for objects;Reaching across midline Static Standing Balance Static Standing - Balance Support: During functional activity Static Standing - Level of Assistance: 6: Modified independent (Device/Increase time) Dynamic Standing Balance Dynamic Standing - Balance Support: During functional activity;Left upper extremity supported Dynamic Standing - Level of Assistance: 6: Modified independent (Device/Increase time) Dynamic Standing - Balance Activities: Reaching for objects;Reaching across midline;Forward lean/weight shifting Extremity/Trunk Assessment RUE Assessment RUE Assessment: Within Functional Limits LUE  Assessment LUE Assessment: Within Functional Limits   See Function Navigator for Current Functional Status.  [facility] 04/20/2015, 7:26 AM

## 2015-04-20 NOTE — Discharge Instructions (Signed)
Inpatient Rehab Discharge Instructions  Spencer GenreKevin L Fischer Discharge date and time: 04/20/15   Activities/Precautions/ Functional Status: Activity: no lifting, driving, or strenuous exercise till cleared by MD. Diet: regular diet Wound Care: Wash with soap and water. Keep clean and dry. Can keep area open to air or apply dry dressing. Contact Dr. Magnus IvanBlackman if you develop increase in pain, any redness or drainage from incision or fever/chills.   Functional status:  ___ No restrictions     ___ Walk up steps independently ___ 24/7 supervision/assistance   ___ Walk up steps with assistance ___ Intermittent supervision/assistance  _X__ Bathe/dress independently _X__ Walk with walker    ___ Bathe/dress with assistance ___ Walk Independently    ___ Shower independently ___ Walk with assistance    ___ Shower with assistance _X__ No alcohol     ___ Return to work/school ________  Special Instructions: 1. Wean pain medications over next 1-2 weeks. 2. Dr. Magnus IvanBlackman to refill pain medications after discharge.    COMMUNITY REFERRALS UPON DISCHARGE:    Home Health:   PT & RN  Agency:[facility] Phone:[phone]   Date of last service:04/20/2015   Medical Equipment/Items Ordered:ROLLING Maryelizabeth KaufmannWALKER, TUB BENCH, BEDSIDE COMMODE  Agency/Supplier:[facility]   [phone]   My questions have been answered and I understand these instructions. I will adhere to these goals and the provided educational materials after my discharge from the hospital.  Patient/Caregiver Signature _______________________________ Date __________  Clinician Signature _______________________________________ Date __________  Please bring this form and your medication list with you to all your follow-up doctor's appointments.

## 2015-04-20 NOTE — Progress Notes (Signed)
Late entry. Patient discharged at approximately 1232 with all belongings. Patient given and reviewed discharge instructions by P. Love, PA. Patient verbalized understanding of discharge instructions. Patient declined 1200 medications due to "ride downstairs and might leave me if I take to long". No complaint of pain.  Spencer Fischer, Spencer Fischer

## 2015-04-21 LAB — CULTURE, BLOOD (ROUTINE X 2)
CULTURE: NO GROWTH
CULTURE: NO GROWTH

## 2015-05-13 DIAGNOSIS — D62 Acute posthemorrhagic anemia: Secondary | ICD-10-CM

## 2015-05-13 DIAGNOSIS — K219 Gastro-esophageal reflux disease without esophagitis: Secondary | ICD-10-CM

## 2015-05-13 DIAGNOSIS — I10 Essential (primary) hypertension: Secondary | ICD-10-CM

## 2015-05-13 DIAGNOSIS — M1990 Unspecified osteoarthritis, unspecified site: Secondary | ICD-10-CM

## 2015-05-13 DIAGNOSIS — Z96643 Presence of artificial hip joint, bilateral: Secondary | ICD-10-CM

## 2015-05-13 DIAGNOSIS — Z471 Aftercare following joint replacement surgery: Secondary | ICD-10-CM

## 2015-06-18 ENCOUNTER — Ambulatory Visit: Payer: No Typology Code available for payment source | Admitting: Family Medicine

## 2015-07-21 ENCOUNTER — Telehealth: Payer: Self-pay | Admitting: *Deleted

## 2015-07-21 NOTE — Telephone Encounter (Signed)
Spencer Fischer from [facility] following up on a certficate of medical necessity that was apparently sent over following hospital discharge back in November2016.  They claim that they had sent it but it had never been returned.  This is in regards to a walker and comode.  A new form was faxed over and handed to [facility]. Awaiting review and possible signature by Dr. Wynn Banker

## 2015-10-01 ENCOUNTER — Encounter: Payer: Self-pay | Admitting: Clinical

## 2015-10-01 ENCOUNTER — Ambulatory Visit: Payer: Medicaid Other | Attending: Family Medicine | Admitting: Family Medicine

## 2015-10-01 ENCOUNTER — Encounter: Payer: Self-pay | Admitting: Family Medicine

## 2015-10-01 VITALS — BP 142/87 | HR 86 | Temp 98.6°F | Resp 16 | Ht 71.0 in | Wt 232.0 lb

## 2015-10-01 DIAGNOSIS — M545 Low back pain, unspecified: Secondary | ICD-10-CM | POA: Insufficient documentation

## 2015-10-01 DIAGNOSIS — Z0001 Encounter for general adult medical examination with abnormal findings: Secondary | ICD-10-CM | POA: Diagnosis not present

## 2015-10-01 DIAGNOSIS — G5600 Carpal tunnel syndrome, unspecified upper limb: Secondary | ICD-10-CM

## 2015-10-01 DIAGNOSIS — F17210 Nicotine dependence, cigarettes, uncomplicated: Secondary | ICD-10-CM | POA: Insufficient documentation

## 2015-10-01 DIAGNOSIS — Z96643 Presence of artificial hip joint, bilateral: Secondary | ICD-10-CM | POA: Diagnosis not present

## 2015-10-01 DIAGNOSIS — R29818 Other symptoms and signs involving the nervous system: Secondary | ICD-10-CM

## 2015-10-01 DIAGNOSIS — D72829 Elevated white blood cell count, unspecified: Secondary | ICD-10-CM | POA: Insufficient documentation

## 2015-10-01 DIAGNOSIS — I739 Peripheral vascular disease, unspecified: Secondary | ICD-10-CM | POA: Diagnosis not present

## 2015-10-01 DIAGNOSIS — M25551 Pain in right hip: Secondary | ICD-10-CM | POA: Insufficient documentation

## 2015-10-01 DIAGNOSIS — M25552 Pain in left hip: Secondary | ICD-10-CM | POA: Insufficient documentation

## 2015-10-01 DIAGNOSIS — F17200 Nicotine dependence, unspecified, uncomplicated: Secondary | ICD-10-CM

## 2015-10-01 DIAGNOSIS — G8929 Other chronic pain: Secondary | ICD-10-CM | POA: Insufficient documentation

## 2015-10-01 DIAGNOSIS — I10 Essential (primary) hypertension: Secondary | ICD-10-CM | POA: Insufficient documentation

## 2015-10-01 DIAGNOSIS — F419 Anxiety disorder, unspecified: Secondary | ICD-10-CM | POA: Diagnosis not present

## 2015-10-01 DIAGNOSIS — G4730 Sleep apnea, unspecified: Secondary | ICD-10-CM

## 2015-10-01 DIAGNOSIS — G4733 Obstructive sleep apnea (adult) (pediatric): Secondary | ICD-10-CM | POA: Insufficient documentation

## 2015-10-01 DIAGNOSIS — Z720 Tobacco use: Secondary | ICD-10-CM

## 2015-10-01 LAB — POCT GLYCOSYLATED HEMOGLOBIN (HGB A1C)

## 2015-10-01 MED ORDER — ACETAMINOPHEN-CODEINE #3 300-30 MG PO TABS: 1.0000 | ORAL_TABLET | Freq: Three times a day (TID) | ORAL | Status: DC | PRN

## 2015-10-01 MED ORDER — SENNA 8.6 MG PO TABS: 2.0000 | ORAL_TABLET | Freq: Two times a day (BID) | ORAL | Status: DC

## 2015-10-01 MED ORDER — METHOCARBAMOL 500 MG PO TABS: 500.0000 mg | ORAL_TABLET | Freq: Three times a day (TID) | ORAL | Status: DC | PRN

## 2015-10-01 MED ORDER — TRIAMTERENE-HCTZ 37.5-25 MG PO TABS: 1.0000 | ORAL_TABLET | Freq: Every day | ORAL | Status: DC

## 2015-10-01 MED ORDER — ASPIRIN 81 MG PO TABS: 81.0000 mg | ORAL_TABLET | Freq: Every day | ORAL | Status: DC

## 2015-10-01 MED ORDER — DOCUSATE SODIUM 100 MG PO CAPS: 100.0000 mg | ORAL_CAPSULE | Freq: Every day | ORAL | Status: DC | PRN

## 2015-10-01 NOTE — Assessment & Plan Note (Signed)
Current smoker Cessation recommended

## 2015-10-01 NOTE — Assessment & Plan Note (Signed)
Elevated on last check  Plan for repeat CBC

## 2015-10-01 NOTE — Progress Notes (Signed)
Establish Care  C/C Hip pain, back pain  Hx hip surgery January 2017 Requesting sleep study  Tobacco user 4 cigarette per day  Pain scale # 8 No suicidal thoughts in the past two weeks

## 2015-10-01 NOTE — Patient Instructions (Addendum)
Caryn BeeKevin was seen today for establish care, back pain and hip pain.  Diagnoses and all orders for this visit:  Suspected sleep apnea -     Split night study; Future  Essential hypertension -     COMPLETE METABOLIC PANEL WITH GFR; Future -     HgB A1c -     Lipid Panel; Future  Current smoker -     triamterene-hydrochlorothiazide (MAXZIDE-25) 37.5-25 MG tablet; Take 1 tablet by mouth daily.  Left leg claudication (HCC) -     aspirin 81 MG tablet; Take 1 tablet (81 mg total) by mouth daily.  Elevated WBC count -     CBC; Future  Chronic bilateral low back pain without sciatica -     acetaminophen-codeine (TYLENOL #3) 300-30 MG tablet; Take 1 tablet by mouth every 8 (eight) hours as needed for moderate pain. -     docusate sodium (COLACE) 100 MG capsule; Take 1 capsule (100 mg total) by mouth daily as needed for mild constipation. -     methocarbamol (ROBAXIN) 500 MG tablet; Take 1-2 tablets (500-1,000 mg total) by mouth every 8 (eight) hours as needed for muscle spasms. -     senna (SENOKOT) 8.6 MG TABS tablet; Take 2 tablets (17.2 mg total) by mouth 2 (two) times daily. For constipation   Smoking cessation support: smoking cessation hotline: 1-800-QUIT-NOW.  Smoking cessation classes are available through [facility]. Call [phone] or visit our website at [email or web address].   F/u in 3-4 weeks for HTN follow up, low back pain follow up, sleep study follow up,  and to discuss carpal tunnel, 30 minute visit    Dr. Armen PickupFunches

## 2015-10-01 NOTE — Progress Notes (Signed)
LOGO@  Subjective:  Patient ID: Spencer Fischer, male    DOB: 05/03/1966  Age: 50 y.o. MRN: [id number]  CC: Establish Care; Back Pain; and Hip Pain   HPI Spencer Fischer presents for     1. Sleep problems: x 5 years. Notice episode that he stops breathing and wakes up often during sleep. Very tired during the day. Has daytime somnolence. He does snore. He reports weight is stable around 230#.   2. HTN: x 5-10 years. No hx of stroke, heart attack, kidney problems. Noticing vision is going bad. No HA. No CP, SOB, no chronic leg swelling.   3. Chronic hip pain: s/p bilateral hip replacement for severe OA in 03/2015. Hip pain was present for 2 years before surgery. He is a smoker. He smokes 4 cig per day. He was previously treated with vicodin for chronic pain. He has not been treated by pain management.   4. Back pain: started a few months after his surgery. Pain is getting worse. Low back pain. Both side. Pain does not radiate. Admits to trickling urine. No incontinence of urine or stool. No groin numbness.   Past Medical History  Diagnosis Date  . Hypertension   . Carpal tunnel syndrome   . Arthritis   . Anxiety     Past Surgical History  Procedure Laterality Date  . Knee surgery Left   . Bilateral anterior total hip arthroplasty Bilateral 04/06/2015    Procedure: BILATERAL ANTERIOR TOTAL HIP ARTHROPLASTY;  Surgeon: Kathryne Hitchhristopher Y Blackman, MD;  Location: [facility] OR;  Service: Orthopedics;  Laterality: Bilateral;    Family History  Problem Relation Age of Onset  . Hypertension Mother   . Hypertension Sister   . Hypertension Brother     Social History  Substance Use Topics  . Smoking status: Current Every Day Smoker -- 0.25 packs/day    Types: Cigarettes  . Smokeless tobacco: Never Used  . Alcohol Use: 3.6 oz/week    6 Cans of beer per week    ROS Review of Systems  Constitutional: Negative for fever, chills, fatigue and unexpected weight change.  Eyes: Negative for visual  disturbance.  Respiratory: Negative for cough and shortness of breath.   Cardiovascular: Negative for chest pain, palpitations and leg swelling.  Gastrointestinal: Negative for nausea, vomiting, abdominal pain, diarrhea, constipation and blood in stool.  Endocrine: Negative for polydipsia, polyphagia and polyuria.  Musculoskeletal: Positive for myalgias, back pain and arthralgias. Negative for gait problem and neck pain.  Skin: Negative for rash.  Allergic/Immunologic: Negative for immunocompromised state.  Hematological: Negative for adenopathy. Does not bruise/bleed easily.  Psychiatric/Behavioral: Positive for sleep disturbance. Negative for suicidal ideas and dysphoric mood. The patient is not nervous/anxious.     Objective:   Today's Vitals: BP 142/87 mmHg  Pulse 86  Temp(Src) 98.6 F (37 C) (Oral)  Resp 16  Ht 5\' 11"  (1.803 m)  Wt 232 lb (105.235 kg)  BMI 32.37 kg/m2  SpO2 96%  Physical Exam  Constitutional: He appears well-developed and well-nourished. No distress.  HENT:  Head: Normocephalic and atraumatic.  Neck: Normal range of motion. Neck supple.  Cardiovascular: Normal rate, regular rhythm, normal heart sounds and intact distal pulses.   Pulses:      Dorsalis pedis pulses are 2+ on the right side, and 1+ on the left side.       Posterior tibial pulses are 2+ on the right side, and 1+ on the left side.  Pulmonary/Chest: Effort normal and breath sounds  normal.  Musculoskeletal: He exhibits no edema.  Back Exam: Back: Normal Curvature, no deformities or CVA tenderness  Paraspinal Tenderness: b/l lumbar tenderness   LE Strength 5/5  LE Sensation: in tact  LE Reflexes 2+ and symmetric  Straight leg raise: negative    Neurological: He is alert.  Skin: Skin is warm and dry. No rash noted. No erythema.  Psychiatric: He has a normal mood and affect.   Lab Results  Component Value Date   HGBA1C 5.12f 10/01/2015    Assessment & Plan:   Problem List Items  Addressed This Visit    Suspected sleep apnea - Primary (Chronic)   Relevant Orders   Split night study   Left leg claudication (HCC) (Chronic)   Relevant Medications   aspirin 81 MG tablet   HTN (hypertension) (Chronic)   Relevant Medications   aspirin 81 MG tablet   triamterene-hydrochlorothiazide (MAXZIDE-25) 37.5-25 MG tablet   Other Relevant Orders   COMPLETE METABOLIC PANEL WITH GFR   HgB Z6X (Completed)   Lipid Panel   Elevated WBC count   Relevant Orders   CBC   Current smoker (Chronic)   Relevant Medications   triamterene-hydrochlorothiazide (MAXZIDE-25) 37.5-25 MG tablet   Chronic low back pain without sciatica (Chronic)   Relevant Medications   acetaminophen-codeine (TYLENOL #3) 300-30 MG tablet   docusate sodium (COLACE) 100 MG capsule   methocarbamol (ROBAXIN) 500 MG tablet   senna (SENOKOT) 8.6 MG TABS tablet      Outpatient Encounter Prescriptions as of 10/01/2015  Medication Sig  . Menthol-Methyl Salicylate (MUSCLE RUB) 10-15 % CREA Apply 2 application topically 2 (two) times daily with breakfast and lunch.  . methocarbamol (ROBAXIN) 500 MG tablet Take 1-2 tablets (500-1,000 mg total) by mouth every 6 (six) hours as needed for muscle spasms.  Marland Kitchen oxyCODONE (OXY IR/ROXICODONE) 5 MG immediate release tablet Take 1-2 tablets (5-10 mg total) by mouth every 6 (six) hours as needed for severe pain or breakthrough pain.  Marland Kitchen triamterene-hydrochlorothiazide (MAXZIDE-25) 37.5-25 MG tablet Take 1 tablet by mouth daily.  Marland Kitchen acetaminophen (TYLENOL) 325 MG tablet Take 1-2 tablets (325-650 mg total) by mouth every 4 (four) hours as needed for mild pain. (Patient not taking: Reported on 10/01/2015)  . aspirin EC 325 MG EC tablet Take 1 tablet (325 mg total) by mouth 2 (two) times daily after a meal. (Patient not taking: Reported on 10/01/2015)  . docusate sodium (COLACE) 100 MG capsule Take 1 capsule (100 mg total) by mouth 2 (two) times daily. (Patient not taking: Reported on  10/01/2015)  . famotidine (PEPCID) 20 MG tablet Take 1 tablet (20 mg total) by mouth 2 (two) times daily. (Patient not taking: Reported on 10/01/2015)  . iron polysaccharides (NIFEREX) 150 MG capsule Take 1 capsule (150 mg total) by mouth 2 (two) times daily before lunch and supper. (Patient not taking: Reported on 10/01/2015)  . nicotine (NICODERM CQ - DOSED IN MG/24 HR) 7 mg/24hr patch Place 1 patch (7 mg total) onto the skin daily. (Patient not taking: Reported on 10/01/2015)  . senna (SENOKOT) 8.6 MG TABS tablet Take 2 tablets (17.2 mg total) by mouth 2 (two) times daily. For constipation (Patient not taking: Reported on 10/01/2015)  . traMADol (ULTRAM) 50 MG tablet Take 1 tablet (50 mg total) by mouth every 6 (six) hours as needed for moderate pain. Alternate this with oxycodone. (Patient not taking: Reported on 10/01/2015)   No facility-administered encounter medications on file as of 10/01/2015.    Follow-up:  No Follow-up on file.    Dessa Phi MD

## 2015-10-01 NOTE — Assessment & Plan Note (Signed)
Decreased pulses and claudication in  L leg Plan  ASA  Smoking cessation

## 2015-10-01 NOTE — Assessment & Plan Note (Signed)
History consistent with OSA Plan Sleep study

## 2015-10-01 NOTE — Assessment & Plan Note (Signed)
A; HTN, suspect non compliance  P: Refilled maxzide CMP

## 2015-10-01 NOTE — Assessment & Plan Note (Signed)
A; chronic low back pain without sciatica P: Tylenol #3 Robaxin Smoking cessation

## 2015-10-01 NOTE — Progress Notes (Signed)
Depression screen PHQ 2/9 10/01/2015  Decreased Interest 2  Down, Depressed, Hopeless 2  PHQ - 2 Score 4  Altered sleeping 2  Tired, decreased energy 2  Change in appetite 0  Feeling bad or failure about yourself  3  Trouble concentrating 2  Moving slowly or fidgety/restless 1  Suicidal thoughts 0  PHQ-9 Score 14    GAD 7 : Generalized Anxiety Score 10/01/2015  Nervous, Anxious, on Edge 2  Control/stop worrying 1  Worry too much - different things 1  Trouble relaxing 1  Restless 1  Easily annoyed or irritable 1  Afraid - awful might happen 0  Total GAD 7 Score 7

## 2015-11-24 ENCOUNTER — Ambulatory Visit (HOSPITAL_BASED_OUTPATIENT_CLINIC_OR_DEPARTMENT_OTHER): Payer: No Typology Code available for payment source | Attending: Family Medicine | Admitting: Internal Medicine

## 2015-11-24 DIAGNOSIS — G4733 Obstructive sleep apnea (adult) (pediatric): Secondary | ICD-10-CM | POA: Insufficient documentation

## 2015-11-24 DIAGNOSIS — Z6831 Body mass index (BMI) 31.0-31.9, adult: Secondary | ICD-10-CM | POA: Insufficient documentation

## 2015-11-24 DIAGNOSIS — G4719 Other hypersomnia: Secondary | ICD-10-CM | POA: Insufficient documentation

## 2015-11-24 DIAGNOSIS — R5383 Other fatigue: Secondary | ICD-10-CM | POA: Insufficient documentation

## 2015-11-24 DIAGNOSIS — I10 Essential (primary) hypertension: Secondary | ICD-10-CM | POA: Insufficient documentation

## 2015-11-24 DIAGNOSIS — F513 Sleepwalking [somnambulism]: Secondary | ICD-10-CM | POA: Insufficient documentation

## 2015-11-24 DIAGNOSIS — R0683 Snoring: Secondary | ICD-10-CM | POA: Insufficient documentation

## 2015-11-24 DIAGNOSIS — Z79899 Other long term (current) drug therapy: Secondary | ICD-10-CM | POA: Insufficient documentation

## 2015-11-24 DIAGNOSIS — E669 Obesity, unspecified: Secondary | ICD-10-CM | POA: Insufficient documentation

## 2015-11-24 DIAGNOSIS — R29818 Other symptoms and signs involving the nervous system: Secondary | ICD-10-CM

## 2015-11-27 DIAGNOSIS — G4730 Sleep apnea, unspecified: Secondary | ICD-10-CM

## 2015-11-27 NOTE — Procedures (Signed)
   Patient Name: Spencer Fischer, [place] Study Date: 11/24/2015 Gender: Male D.O.B: May 01, 1966 Age (years): 50 Referring Provider: Gerilyn NestleJosalyn Funches Height (inches): 71 Interpreting Physician: Jetty Duhamellinton Young MD, ABSM Weight (lbs): 225 RPSGT: Shelah LewandowskyGregory, [place] BMI: 31 MRN: [id number] Neck Size: 18.00 CLINICAL INFORMATION Sleep Study Type: NPSG Indication for sleep study: Excessive Daytime Sleepiness, Fatigue, Hypertension, Obesity, OSA, Sleep walking/talking/parasomnias, Snoring, Witnessed Apneas Epworth Sleepiness Score: 12  SLEEP STUDY TECHNIQUE As per the AASM Manual for the Scoring of Sleep and Associated Events v2.3 (April 2016) with a hypopnea requiring 4% desaturations. The channels recorded and monitored were frontal, central and occipital EEG, electrooculogram (EOG), submentalis EMG (chin), nasal and oral airflow, thoracic and abdominal wall motion, anterior tibialis EMG, snore microphone, electrocardiogram, and pulse oximetry.  MEDICATIONS Patient's medications include: charted for review Medications self-administered by patient during sleep study : MAXZIDE.  SLEEP ARCHITECTURE The study was initiated at 10:49:00 PM and ended at 5:21:35 AM. Sleep onset time was 13.8 minutes and the sleep efficiency was 75.2%. The total sleep time was 295.3 minutes. Stage REM latency was 65.5 minutes. The patient spent 21.33% of the night in stage N1 sleep, 50.06% in stage N2 sleep, 0.00% in stage N3 and 28.61% in REM. Alpha intrusion was absent. Supine sleep was 37.59%.  RESPIRATORY PARAMETERS The overall apnea/hypopnea index (AHI) was 8.5 per hour. There were 13 total apneas, including 13 obstructive, 0 central and 0 mixed apneas. There were 29 hypopneas and 56 RERAs. The AHI during Stage REM sleep was 24.9 per hour. AHI while supine was 4.3 per hour. The mean oxygen saturation was 93.50%. The minimum SpO2 during sleep was 81.00%. Moderate snoring was noted during this study. Wake after sleep  onset 83 minutes  CARDIAC DATA The 2 lead EKG demonstrated sinus rhythm. The mean heart rate was 85.58 beats per minute. Other EKG findings include: None.  LEG MOVEMENT DATA The total PLMS were 4 with a resulting PLMS index of 0.81. Associated arousal with leg movement index was 0.4 .  IMPRESSIONS - Mild obstructive sleep apnea occurred during this study (AHI = 8.5/h). - No significant central sleep apnea occurred during this study (CAI = 0.0/h). - Mild oxygen desaturation was noted during this study (Min O2 = 81.00%). - The patient snored with Moderate snoring volume. - No cardiac abnormalities were noted during this study. - Clinically significant periodic limb movements did not occur during sleep. No significant associated arousals. - Difficulty maintaining sleep. Tech noted patient drinking fruit juice and eating cookies throughout the study  DIAGNOSIS - Obstructive Sleep Apnea (327.23 [G47.33 ICD-10])  RECOMMENDATIONS - CPAP titration or a fitted oral appliance to address obstructive sleep apnea - Avoid alcohol, sedatives and other CNS depressants that may worsen sleep apnea and disrupt normal sleep architecture. - Sleep hygiene should be reviewed to assess factors that may improve sleep quality. - Weight management and regular exercise should be initiated or continued if appropriate.  Waymon BudgeYOUNG,CLINTON D Diplomate, American Board of Sleep Medicine  ELECTRONICALLY SIGNED ON:  11/27/2015, 12:59 PM [facility] PH: [phone]   FX: [phone] ACCREDITED BY THE AMERICAN ACADEMY OF SLEEP MEDICINE

## 2015-12-02 ENCOUNTER — Other Ambulatory Visit: Payer: Self-pay | Admitting: Family Medicine

## 2015-12-02 DIAGNOSIS — E669 Obesity, unspecified: Secondary | ICD-10-CM | POA: Insufficient documentation

## 2015-12-02 DIAGNOSIS — G4733 Obstructive sleep apnea (adult) (pediatric): Secondary | ICD-10-CM

## 2015-12-24 ENCOUNTER — Telehealth: Payer: Self-pay

## 2015-12-24 DIAGNOSIS — G4733 Obstructive sleep apnea (adult) (pediatric): Secondary | ICD-10-CM

## 2015-12-27 ENCOUNTER — Telehealth: Payer: Self-pay

## 2015-12-27 NOTE — Telephone Encounter (Signed)
Will forward to pcp

## 2015-12-27 NOTE — Telephone Encounter (Signed)
Sleep study from last month revealed mild OSA  RECOMMENDATIONS - CPAP titration or a fitted oral appliance to address obstructive sleep apnea, patient has been referred to pulmonology to assist with CPAP titration. There is a note that he was called to schedule an appt and needs to call back.   Also   - Avoid alcohol, sedatives and other CNS depressants that may worsen sleep apnea and disrupt normal sleep architecture. - Sleep hygiene should be reviewed to assess factors that may improve sleep quality. - Weight management and regular exercise should be initiated or continued if appropriate.  Work on sleep hygiene: Bedroom is for  sleep and sex only. If you cannot sleep within 20 minutes do the following: Get out of bed and go to dimly lit room Read a book You may drink water or chamomile unsweetened tea-avoid food and any other beverage Avoid the phone and TV

## 2015-12-27 NOTE — Telephone Encounter (Signed)
Contacted pt back and gave him the results and the recommendations. Pt states he has an appointment schedule with pulmonary on Sept 7. Pt would like to know what can he do for the time being to get some rest..

## 2015-12-27 NOTE — Telephone Encounter (Signed)
Contacted pt back to give him the recommendations pt did not answer lvm for pt to give me a call back

## 2015-12-27 NOTE — Telephone Encounter (Signed)
Contacted patient to go over sleep study results. Pt has an appointment schedule with pulmonary

## 2015-12-27 NOTE — Assessment & Plan Note (Signed)
Sleep study revealed the following -mild OSA   RECOMMENDATIONS - CPAP titration or a fitted oral appliance to address obstructive sleep apnea - Avoid alcohol, sedatives and other CNS depressants that may worsen sleep apnea and disrupt normal sleep architecture. - Sleep hygiene should be reviewed to assess factors that may improve sleep quality. - Weight management and regular exercise should be initiated or continued if appropriate.

## 2015-12-27 NOTE — Telephone Encounter (Signed)
There are no additional recommendations Exercise daily is helpful

## 2015-12-27 NOTE — Telephone Encounter (Signed)
Pt calling requesting results of pts sleep study

## 2016-02-17 ENCOUNTER — Encounter: Payer: Self-pay | Admitting: Pulmonary Disease

## 2016-02-17 ENCOUNTER — Ambulatory Visit (INDEPENDENT_AMBULATORY_CARE_PROVIDER_SITE_OTHER): Payer: Medicaid Other | Admitting: Pulmonary Disease

## 2016-02-17 VITALS — BP 138/78 | HR 71 | Ht 71.0 in | Wt 226.0 lb

## 2016-02-17 DIAGNOSIS — Z720 Tobacco use: Secondary | ICD-10-CM | POA: Diagnosis not present

## 2016-02-17 DIAGNOSIS — E669 Obesity, unspecified: Secondary | ICD-10-CM | POA: Diagnosis not present

## 2016-02-17 DIAGNOSIS — G4733 Obstructive sleep apnea (adult) (pediatric): Secondary | ICD-10-CM | POA: Insufficient documentation

## 2016-02-17 DIAGNOSIS — F17200 Nicotine dependence, unspecified, uncomplicated: Secondary | ICD-10-CM

## 2016-02-17 NOTE — Progress Notes (Signed)
Subjective:    Patient ID: Spencer Fischer, male    DOB: 14-Jul-1965, 50 y.o.   MRN: [id number]  HPI   This is the case of Spencer Fischer, 50 y.o. Male, who was referred by Dr. Dessa Phi in consultation regarding OSA.    As you very well know, patient smokes 1/4 PPD x 35 yrs, denies any lung issues, is here for obstructive sleep apnea.  Patient is single. He endorses snoring, witnessed apneas, gasping, choking, unrefreshed sleep, frequent awakenings. He wakes up in the morning feeling unrefreshed. He ends up napping couple times during the day. He endorses sleep talking. Denies any other abnormal behavior.  Patient had a lab sleep study on 11/17/2015, AHI was 8.5.  Patient has chronic hip pain issues. He had surgery of both his hips in October 2016.  He has been on Oxycodone 5 mg TID x 3-4 mos. it is unlikely that he'll be off oxycodone.  Review of Systems  Constitutional: Negative.  Negative for fever and unexpected weight change.  HENT: Positive for congestion and rhinorrhea. Negative for dental problem, ear pain, nosebleeds, postnasal drip, sinus pressure, sneezing, sore throat and trouble swallowing.   Eyes: Positive for discharge and itching. Negative for redness.  Respiratory: Positive for cough, shortness of breath and wheezing. Negative for chest tightness.   Cardiovascular: Negative.  Negative for palpitations and leg swelling.  Gastrointestinal: Negative.  Negative for nausea and vomiting.  Endocrine: Negative.   Genitourinary: Negative.  Negative for dysuria.  Musculoskeletal: Positive for arthralgias, back pain and myalgias. Negative for joint swelling.  Skin: Negative.  Negative for rash.  Allergic/Immunologic: Positive for environmental allergies.  Neurological: Positive for headaches.  Hematological: Negative.  Does not bruise/bleed easily.  Psychiatric/Behavioral: Negative.  Negative for dysphoric mood. The patient is not nervous/anxious.    Past Medical History:    Diagnosis Date  . Anxiety   . Arthritis   . Carpal tunnel syndrome   . Hypertension    (-) CA, DVT  Family History  Problem Relation Age of Onset  . Hypertension Mother   . Hypertension Sister   . Hypertension Brother      Past Surgical History:  Procedure Laterality Date  . BILATERAL ANTERIOR TOTAL HIP ARTHROPLASTY Bilateral 04/06/2015   Procedure: BILATERAL ANTERIOR TOTAL HIP ARTHROPLASTY;  Surgeon: Kathryne Hitch, MD;  Location: [facility] OR;  Service: Orthopedics;  Laterality: Bilateral;  . KNEE SURGERY Left     Social History   Social History  . Marital status: Single    Spouse name: N/A  . Number of children: N/A  . Years of education: N/A   Occupational History  . Not on file.   Social History Main Topics  . Smoking status: Current Every Day Smoker    Packs/day: 0.25    Types: Cigarettes  . Smokeless tobacco: Never Used  . Alcohol use 3.6 oz/week    6 Cans of beer per week  . Drug use: No  . Sexual activity: Not on file   Other Topics Concern  . Not on file   Social History Narrative  . No narrative on file    No children, not working, working on disability, did carpentry work. Occasional ETOH. No Known Allergies   Outpatient Medications Prior to Visit  Medication Sig Dispense Refill  . aspirin 81 MG tablet Take 1 tablet (81 mg total) by mouth daily. 30 tablet 11  . Menthol-Methyl Salicylate (MUSCLE RUB) 10-15 % CREA Apply 2 application topically 2 (two)  times daily with breakfast and lunch. 85 g 0  . methocarbamol (ROBAXIN) 500 MG tablet Take 1-2 tablets (500-1,000 mg total) by mouth every 8 (eight) hours as needed for muscle spasms. 90 tablet 1  . senna (SENOKOT) 8.6 MG TABS tablet Take 2 tablets (17.2 mg total) by mouth 2 (two) times daily. For constipation 120 each 5  . triamterene-hydrochlorothiazide (MAXZIDE-25) 37.5-25 MG tablet Take 1 tablet by mouth daily. 30 tablet 3  . docusate sodium (COLACE) 100 MG capsule Take 1 capsule (100 mg total)  by mouth daily as needed for mild constipation. (Patient not taking: Reported on 02/17/2016) 30 capsule 5  . acetaminophen-codeine (TYLENOL #3) 300-30 MG tablet Take 1 tablet by mouth every 8 (eight) hours as needed for moderate pain. 60 tablet 0   No facility-administered medications prior to visit.    Meds ordered this encounter  Medications  . DISCONTD: HYDROcodone-acetaminophen (NORCO/VICODIN) 5-325 MG tablet    Sig: Take 1 tablet by mouth 2 (two) times daily as needed.    Refill:  0  . naproxen (NAPROSYN) 375 MG tablet    Sig: Take 375 mg by mouth 2 (two) times daily with a meal.    Refill:  5  . Oxycodone HCl 10 MG TABS    Sig: Take 1 tablet by mouth 3 (three) times daily as needed.    Refill:  0        Objective:   Physical Exam  Vitals:  Vitals:   02/17/16 0938  BP: 138/78  Pulse: 71  SpO2: 97%  Weight: 226 lb (102.5 kg)  Height: 5\' 11"  (1.803 m)    Constitutional/General:  Pleasant, well-nourished, well-developed, not in any distress,  Comfortably seating.  Well kempt  Body mass index is 31.52 kg/m. Wt Readings from Last 3 Encounters:  02/17/16 226 lb (102.5 kg)  11/24/15 225 lb (102.1 kg)  10/01/15 232 lb (105.2 kg)    Neck circumference: 18 inches  HEENT: Pupils equal and reactive to light and accommodation. Anicteric sclerae. Normal nasal mucosa.   No oral  lesions,  mouth clear,  oropharynx clear, no postnasal drip. (-) Oral thrush. No dental caries.  Airway - Mallampati class IV  Neck: No masses. Midline trachea. No JVD, (-) LAD. (-) bruits appreciated.  Respiratory/Chest: Grossly normal chest. (-) deformity. (-) Accessory muscle use.  Symmetric expansion. (-) Tenderness on palpation.  Resonant on percussion.  Diminished BS on both lower lung zones. (-) wheezing, crackles, rhonchi (-) egophony  Cardiovascular: Regular rate and  rhythm, heart sounds normal, no murmur or gallops, no peripheral edema  Gastrointestinal:  Normal bowel sounds.  Soft, non-tender. No hepatosplenomegaly.  (-) masses.   Musculoskeletal:  Normal muscle tone. Normal gait.   Extremities: Grossly normal. (-) clubbing, cyanosis.  (-) edema  Skin: (-) rash,lesions seen.   Neurological/Psychiatric : alert, oriented to time, place, person. Normal mood and affect         Assessment & Plan:  OSA (obstructive sleep apnea) Patient is single. He endorses snoring, witnessed apneas, gasping, choking, unrefreshed sleep, frequent awakenings. He wakes up in the morning feeling unrefreshed. He ends up napping couple times during the day. He endorses sleep talking. Denies any other abnormal behavior.  Patient had a lab sleep study on 11/17/2015, AHI was 8.5. ESS 18. He takes Oxycodone 5 mg TID.   Plan: We extensively discussed the diagnosis, pathophysiology, and treatment options for Obstructive Sleep Apnea (OSA).  We discussed treatment options for OSA including CPAP, BiPaP, as well  as surgical options and oral devices.   We will start patient on autocpap 5-15 cm water. He is on chronic oxycodone. Patient was instructed to call the office if he/she has not received the PAP device in 1-2 weeks.  Patient was instructed to have mask, tubings, filter, reservoir cleaned at least once a week with soapy water.  Patient was instructed to call the office if he/she is having issues with the PAP device.    I advised patient to obtain sufficient amount of sleep --  7 to 8 hours at least in a 24 hr period.  Patient was advised to follow Fischer sleep hygiene.  Patient was advised NOT to engage in activities requiring concentration and/or vigilance if he/she is and  sleepy.  Patient is NOT to drive if he/she is sleepy.      Current smoker Weight reduction  Obesity (BMI 30.0-34.9) Weight reduction   Thank you very much for letting me participate in this patient's care. Please do not hesitate to give me a call if you have any questions or concerns regarding the  treatment plan.   Patient will follow up with me in 6-8 weeks.    Pollie MeyerJ. Angelo A. de Dios, MD 02/17/2016   10:07 AM Pulmonary and Critical Care Medicine [facility] Pager: [phone] Office: [phone], Fax: [phone]

## 2016-02-17 NOTE — Assessment & Plan Note (Signed)
Weight reduction 

## 2016-02-17 NOTE — Assessment & Plan Note (Signed)
Patient is single. He endorses snoring, witnessed apneas, gasping, choking, unrefreshed sleep, frequent awakenings. He wakes up in the morning feeling unrefreshed. He ends up napping couple times during the day. He endorses sleep talking. Denies any other abnormal behavior.  Patient had a lab sleep study on 11/17/2015, AHI was 8.5. ESS 18. He takes Oxycodone 5 mg TID.   Plan: We extensively discussed the diagnosis, pathophysiology, and treatment options for Obstructive Sleep Apnea (OSA).  We discussed treatment options for OSA including CPAP, BiPaP, as well as surgical options and oral devices.   We will start patient on autocpap 5-15 cm water. He is on chronic oxycodone. Patient was instructed to call the office if he/she has not received the PAP device in 1-2 weeks.  Patient was instructed to have mask, tubings, filter, reservoir cleaned at least once a week with soapy water.  Patient was instructed to call the office if he/she is having issues with the PAP device.    I advised patient to obtain sufficient amount of sleep --  7 to 8 hours at least in a 24 hr period.  Patient was advised to follow good sleep hygiene.  Patient was advised NOT to engage in activities requiring concentration and/or vigilance if he/she is and  sleepy.  Patient is NOT to drive if he/she is sleepy.

## 2016-02-17 NOTE — Patient Instructions (Signed)
  It was a pleasure taking care of you today!  You are diagnosed with Obstructive Sleep Apnea or OSA.  You stop breathing  9 times/hr.   We will order you an autoCPAP  machine.  Please call the office if you do NOT receive your machine in the next 1-2 weeks.   Please make sure you use your CPAP device everytime you sleep.  We will monitor the usage of your machine per your insurance requirement.  Your insurance company may take the machine from you if you are not using it regularly.   Please clean the mask, tubings, filter, water reservoir with soapy water every week.  Please use distilled water for the water reservoir.   Please call the office or your machine provider (DME company) if you are having issues with the device.    Return to clinic in 6-8 weeks with Dr. Christene Slatese Dios or NP

## 2016-02-24 ENCOUNTER — Emergency Department (HOSPITAL_COMMUNITY)
Admission: EM | Admit: 2016-02-24 | Discharge: 2016-02-25 | Disposition: A | Discharge status: Home / Self Care | Payer: Medicaid Other | Attending: Emergency Medicine | Admitting: Emergency Medicine

## 2016-02-24 ENCOUNTER — Encounter (HOSPITAL_COMMUNITY): Payer: Self-pay | Admitting: *Deleted

## 2016-02-24 ENCOUNTER — Emergency Department (HOSPITAL_COMMUNITY): Payer: Medicaid Other

## 2016-02-24 DIAGNOSIS — I10 Essential (primary) hypertension: Secondary | ICD-10-CM | POA: Diagnosis not present

## 2016-02-24 DIAGNOSIS — Y9389 Activity, other specified: Secondary | ICD-10-CM | POA: Diagnosis not present

## 2016-02-24 DIAGNOSIS — X509XXA Other and unspecified overexertion or strenuous movements or postures, initial encounter: Secondary | ICD-10-CM | POA: Insufficient documentation

## 2016-02-24 DIAGNOSIS — M62830 Muscle spasm of back: Secondary | ICD-10-CM | POA: Insufficient documentation

## 2016-02-24 DIAGNOSIS — M549 Dorsalgia, unspecified: Secondary | ICD-10-CM | POA: Diagnosis present

## 2016-02-24 DIAGNOSIS — F17210 Nicotine dependence, cigarettes, uncomplicated: Secondary | ICD-10-CM | POA: Insufficient documentation

## 2016-02-24 DIAGNOSIS — Y999 Unspecified external cause status: Secondary | ICD-10-CM | POA: Insufficient documentation

## 2016-02-24 DIAGNOSIS — Y929 Unspecified place or not applicable: Secondary | ICD-10-CM | POA: Insufficient documentation

## 2016-02-24 DIAGNOSIS — Z96643 Presence of artificial hip joint, bilateral: Secondary | ICD-10-CM | POA: Insufficient documentation

## 2016-02-24 DIAGNOSIS — Z7982 Long term (current) use of aspirin: Secondary | ICD-10-CM | POA: Diagnosis not present

## 2016-02-24 MED ORDER — METHOCARBAMOL 500 MG PO TABS: 500.0000 mg | ORAL_TABLET | Freq: Two times a day (BID) | ORAL | 0 refills | Status: DC

## 2016-02-24 MED ORDER — DIAZEPAM 5 MG/ML IJ SOLN
5.0000 mg | Freq: Once | INTRAMUSCULAR | Status: AC
  Administered 2016-02-24: 5 mg via INTRAMUSCULAR
  Filled 2016-02-24: qty 2

## 2016-02-24 NOTE — ED Triage Notes (Signed)
Pt here for chronic back pain that is worse since he was changing light bulbs yesterday and "twisted funny".  Pt also states that he left his oxycodones at his nieces house and needs some medication.  No neuro deficits with this back pain, pt states that it hurts more when he straightens out and this pain takes his breath.

## 2016-02-24 NOTE — ED Provider Notes (Signed)
[facility]-EMERGENCY DEPT Provider Note   CSN: [id number] Arrival date & time: 02/24/16  2006  By signing my name below, I, Spencer Fischer, attest that this documentation has been prepared under the direction and in the presence of  Spencer Horsemanobert Shizuo Biskup, PA-C. Electronically Signed: Clovis PuAvnee Fischer, ED Scribe. 02/24/16. 10:32 PM.    History   Chief Complaint Chief Complaint  Patient presents with  . Back Pain    The history is provided by the patient. No language interpreter was used.   HPI Comments:  Spencer Fischer is a 50 y.o. male, with a hx of chronic lower back pain, who presents to the Emergency Department complaining of worsening, moderate back pain onset yesterday. Pt notes he went to change a light bulb and as he was descending the step ladder he hurt his back. Pt notes he was on the second to last step when he stepped down and "twitched his back". Pt notes associated muscle spasms, and states the pain is exacerbated when he coughs. States that the pain takes his breath away.  Pt usually takes oxycodone for his pain and notes he left his pills at his nieces house. He denies any other complaints at this time.   Past Medical History:  Diagnosis Date  . Anxiety   . Arthritis   . Carpal tunnel syndrome   . Hypertension     Patient Active Problem List   Diagnosis Date Noted  . OSA (obstructive sleep apnea) 02/17/2016  . Obesity (BMI 30.0-34.9) 12/02/2015  . Mild obstructive sleep apnea 10/01/2015  . HTN (hypertension) 10/01/2015  . Current smoker 10/01/2015  . Left leg claudication (HCC) 10/01/2015  . Elevated WBC count 10/01/2015  . Chronic low back pain without sciatica 10/01/2015  . Carpal tunnel syndrome 10/01/2015  . Hypokalemia 04/14/2015  . GERD (gastroesophageal reflux disease) 04/13/2015  . Osteoarthritis of bilateral hips 04/06/2015  . 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    Past Surgical History:  Procedure Laterality Date  . BILATERAL ANTERIOR TOTAL HIP  ARTHROPLASTY Bilateral 04/06/2015   Procedure: BILATERAL ANTERIOR TOTAL HIP ARTHROPLASTY;  Surgeon: Kathryne Hitchhristopher Y Blackman, MD;  Location: [facility] OR;  Service: Orthopedics;  Laterality: Bilateral;  . KNEE SURGERY Left        Home Medications    Prior to Admission medications   Medication Sig Start Date End Date Taking? Authorizing Provider  aspirin 81 MG tablet Take 1 tablet (81 mg total) by mouth daily. 10/01/15   Josalyn Funches, MD  docusate sodium (COLACE) 100 MG capsule Take 1 capsule (100 mg total) by mouth daily as needed for mild constipation. Patient not taking: Reported on 02/17/2016 10/01/15   Dessa PhiJosalyn Funches, MD  Menthol-Methyl Salicylate (MUSCLE RUB) 10-15 % CREA Apply 2 application topically 2 (two) times daily with breakfast and lunch. 04/20/15   Jacquelynn CreePamela S Love, PA-C  methocarbamol (ROBAXIN) 500 MG tablet Take 1-2 tablets (500-1,000 mg total) by mouth every 8 (eight) hours as needed for muscle spasms. 10/01/15   Josalyn Funches, MD  naproxen (NAPROSYN) 375 MG tablet Take 375 mg by mouth 2 (two) times daily with a meal. 01/07/16   Historical Provider, MD  Oxycodone HCl 10 MG TABS Take 1 tablet by mouth 3 (three) times daily as needed. 02/07/16   Historical Provider, MD  senna (SENOKOT) 8.6 MG TABS tablet Take 2 tablets (17.2 mg total) by mouth 2 (two) times daily. For constipation 10/01/15   Dessa PhiJosalyn Funches, MD  triamterene-hydrochlorothiazide (MAXZIDE-25) 37.5-25 MG tablet Take 1 tablet by mouth daily.  10/01/15   Dessa Phi, MD    Family History Family History  Problem Relation Age of Onset  . Hypertension Mother   . Hypertension Sister   . Hypertension Brother     Social History Social History  Substance Use Topics  . Smoking status: Current Every Day Smoker    Packs/day: 0.25    Types: Cigarettes  . Smokeless tobacco: Never Used  . Alcohol use 3.6 oz/week    6 Cans of beer per week     Allergies   Review of patient's allergies indicates no known  allergies.   Review of Systems Review of Systems  Constitutional: Negative for chills and fever.  Gastrointestinal:       No bowel incontinence  Genitourinary:       No urinary incontinence  Musculoskeletal: Positive for arthralgias, back pain and myalgias.  Neurological:       No saddle anesthesia  All other systems reviewed and are negative.    Physical Exam Updated Vital Signs BP (!) 152/108   Pulse 76   Temp 98.2 F (36.8 C) (Oral)   Resp 18   Wt 226 lb (102.5 kg)   SpO2 97%   BMI 31.52 kg/m   Physical Exam Physical Exam  Constitutional: Pt appears well-developed and well-nourished. No distress.  HENT:  Head: Normocephalic and atraumatic.  Mouth/Throat: Oropharynx is clear and moist. No oropharyngeal exudate.  Eyes: Conjunctivae are normal.  Neck: Normal range of motion. Neck supple.  No meningismus Cardiovascular: Normal rate, regular rhythm and intact distal pulses.   Pulmonary/Chest: Effort normal and breath sounds normal. No respiratory distress. Pt has no wheezes.  Abdominal: Pt exhibits no distension Musculoskeletal:  Lumbar paraspinal muscles tender to palpation, no bony CTLS spine tenderness, deformity, step-off, or crepitus Lymphadenopathy: Pt has no cervical adenopathy.  Neurological: Pt is alert and oriented Speech is clear and goal oriented, follows commands Normal 5/5 strength in upper and lower extremities bilaterally including dorsiflexion and plantar flexion, strong and equal grip strength Sensation intact Great toe extension intact Moves extremities without ataxia, coordination intact Normal gait Normal balance No Clonus Skin: Skin is warm and dry. No rash noted. Pt is not diaphoretic. No erythema.  Psychiatric: Pt has a normal mood and affect. Behavior is normal.  Nursing note and vitals reviewed.   ED Treatments / Results  DIAGNOSTIC STUDIES:  Oxygen Saturation is 97% on RA, normal by my interpretation.    COORDINATION OF  CARE:  10:26 PM Discussed treatment plan with pt at bedside and pt agreed to plan.  Labs (all labs ordered are listed, but only abnormal results are displayed) Labs Reviewed - No data to display  EKG  EKG Interpretation None       Radiology No results found.  Procedures Procedures (including critical care time)  Medications Ordered in ED Medications - No data to display   Initial Impression / Assessment and Plan / ED Course  I have reviewed the triage vital signs and the nursing notes.  Pertinent labs & imaging results that were available during my care of the patient were reviewed by me and considered in my medical decision making (see chart for details).  Clinical Course    Patient with back pain.  No neurological deficits and normal neuro exam.  Patient is ambulatory.  No loss of bowel or bladder control.  Doubt cauda equina.  Denies fever,  doubt epidural abscess or other lesion. Recommend back exercises, stretching, RICE, and will treat with a short course  of robaxin.  Encouraged the patient that there could be a need for additional workup and/or imaging such as MRI, if the symptoms do not resolve. Patient advised that if the back pain does not resolve, or radiates, this could progress to more serious conditions and is encouraged to follow-up with PCP or orthopedics within 2 weeks.     Final Clinical Impressions(s) / ED Diagnoses   Final diagnoses:  Muscle spasm of back    New Prescriptions New Prescriptions   METHOCARBAMOL (ROBAXIN) 500 MG TABLET    Take 1 tablet (500 mg total) by mouth 2 (two) times daily.  I personally performed the services described in this documentation, which was scribed in my presence. The recorded information has been reviewed and is accurate.       Spencer Horseman, PA-C 02/25/16 0004    Zadie Rhine, MD 02/25/16 [phone]

## 2016-02-28 ENCOUNTER — Telehealth: Payer: Self-pay | Admitting: Family Medicine

## 2016-02-28 NOTE — Telephone Encounter (Signed)
Patient called the office asking to speak with nurse regarding the C pap machine. Patient wants to know if its been ordered and when will he get it. He also has more questions about his appointment with his specialist. Please follow up.  Thank you

## 2016-03-06 NOTE — Telephone Encounter (Signed)
Patient called again to follow up on his c pap machine. He wants to speak with provider. Please follow up.  Thank you.

## 2016-03-07 ENCOUNTER — Telehealth: Payer: Self-pay | Admitting: Pulmonary Disease

## 2016-03-07 NOTE — Telephone Encounter (Signed)
Called and spoke with patient, he says that he has not heard from [facility].  Advised patient that I would contact [facility] to check status of CPAP order.    Misty StanleyLisa from [employer] [facility] [facility] states that she had spoken with patient and advised patient that they are waiting for a signature from [employer] for insurance to pay for the equipment.  Misty StanleyLisa states that she sent an order for Dr. Christene Slatese Dios to sign through [employer].  I advised her that I did not think we have that program and requested that she fax the order for him to sign.    She said she would fax the order and contact the patient to ensure that he is aware.  Nothing further needed.

## 2016-03-07 NOTE — Telephone Encounter (Signed)
Pt calling back about cpap machine still not received it been 3 weeks [phone] pt is quite irritated that he still doesn't have it

## 2016-03-10 NOTE — Telephone Encounter (Signed)
Pt was called and pt states that CPAP machine is being ordered from his pulmonologist. Marybelle KillingsPaperwork will be faxed over for Dr. Armen PickupFunches.

## 2016-03-30 ENCOUNTER — Ambulatory Visit: Payer: No Typology Code available for payment source | Admitting: Adult Health

## 2016-04-03 ENCOUNTER — Ambulatory Visit: Payer: No Typology Code available for payment source | Admitting: Adult Health

## 2016-04-04 ENCOUNTER — Ambulatory Visit (INDEPENDENT_AMBULATORY_CARE_PROVIDER_SITE_OTHER): Payer: Medicaid Other | Admitting: Sports Medicine

## 2016-04-04 ENCOUNTER — Encounter (INDEPENDENT_AMBULATORY_CARE_PROVIDER_SITE_OTHER): Payer: Self-pay | Admitting: Sports Medicine

## 2016-04-04 ENCOUNTER — Ambulatory Visit (INDEPENDENT_AMBULATORY_CARE_PROVIDER_SITE_OTHER): Payer: Self-pay | Admitting: Orthopedic Surgery

## 2016-04-04 VITALS — BP 133/89 | HR 68 | Ht 71.0 in | Wt 225.0 lb

## 2016-04-04 DIAGNOSIS — G5603 Carpal tunnel syndrome, bilateral upper limbs: Secondary | ICD-10-CM | POA: Diagnosis not present

## 2016-04-04 MED ORDER — METHYLPREDNISOLONE ACETATE 40 MG/ML IJ SUSP
40.0000 mg | INTRAMUSCULAR | Status: AC | PRN
  Administered 2016-04-04: 40 mg

## 2016-04-04 MED ORDER — BUPIVACAINE HCL 0.5 % IJ SOLN
1.0000 mL | INTRAMUSCULAR | Status: AC | PRN
  Administered 2016-04-04: 1 mL

## 2016-04-04 NOTE — Progress Notes (Deleted)
   Spencer GenreKevin L Fischer - [age] y.o. male MRN [id number]  Date of birth: 04/08/1966  Office Visit Note: Visit Date: 04/04/2016 PCP: Lora PaulaFUNCHES, JOSALYN C, MD Referred by: Dessa PhiFunches, Josalyn, MD  Assessment & Plan: Visit Diagnoses:  1. Bilateral carpal tunnel syndrome     Plan:   {Plan narrative}    There are no Patient Instructions on file for this visit.  Meds & Orders: No orders of the defined types were placed in this encounter.  No orders of the defined types were placed in this encounter.   Follow-up: Return in about 6 weeks (around 05/16/2016) for repeat ultrasound.   Procedures: No procedures performed  No notes on file   Clinical History: No additional findings.  He reports that he has been smoking Cigarettes.  He has been smoking about 0.25 packs per day. He has never used smokeless tobacco.  Recent Labs  10/01/15 0947  HGBA1C 5.5936f    Subjective: Chief Complaint  Patient presents with  . Left Wrist - Follow-up  . Right Wrist - Follow-up  . Follow-up    bilateral wrist pain    HPI ROS  Objective:  VS:  HT:5\' 11"  (180.3 cm)   WT:225 lb (102.1 kg)  BMI:31.4    BP:133/89  HR:68bpm  TEMP: ( )  RESP:  Physical Exam  Ortho Exam Imaging: No results found.  Past Medical/Family/Surgical/Social History: Patient Active Problem List   Diagnosis Date Noted  . OSA (obstructive sleep apnea) 02/17/2016  . Obesity (BMI 30.0-34.9) 12/02/2015  . Mild obstructive sleep apnea 10/01/2015  . HTN (hypertension) 10/01/2015  . Current smoker 10/01/2015  . Left leg claudication (HCC) 10/01/2015  . Elevated WBC count 10/01/2015  . Chronic low back pain without sciatica 10/01/2015  . Carpal tunnel syndrome 10/01/2015  . Hypokalemia 04/14/2015  . GERD (gastroesophageal reflux disease) 04/13/2015  . 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   Past Medical History:  Diagnosis Date  . Anxiety   . Arthritis   . Carpal tunnel syndrome   . Hypertension    Family History    Problem Relation Age of Onset  . Hypertension Mother   . Hypertension Sister   . Hypertension Brother    Past Surgical History:  Procedure Laterality Date  . BILATERAL ANTERIOR TOTAL HIP ARTHROPLASTY Bilateral 04/06/2015   Procedure: BILATERAL ANTERIOR TOTAL HIP ARTHROPLASTY;  Surgeon: Kathryne Hitchhristopher Y Blackman, MD;  Location: [facility] OR;  Service: Orthopedics;  Laterality: Bilateral;  . KNEE SURGERY Left    Social History   Occupational History  . Not on file.   Social History Main Topics  . Smoking status: Current Every Day Smoker    Packs/day: 0.25    Types: Cigarettes  . Smokeless tobacco: Never Used  . Alcohol use 3.6 oz/week    6 Cans of beer per week  . Drug use: No  . Sexual activity: Not on file

## 2016-04-04 NOTE — Progress Notes (Signed)
Ebenezer Mccaskey Jacinto - 50 y.o. male MRN [id number]  Date of birth: 03-22-1966  Office Visit Note: Visit Date: 04/04/2016 PCP: Lora Paula, MD Referred by: Dessa Phi, MD  Assessment & Plan: Visit Diagnoses:  1. Bilateral carpal tunnel syndrome     Plan:   Chronic recurrent symptoms now with worsening over the past 2 weeks. Findings slightly concerning for potential cervical radiculopathy component but the patient has responded well from prior CTS injections. Discussed using bilateral injections but given prior findings on X-ray will need further testing if any lack of improvement. Bilateral wrist braces provided.     There are no Patient Instructions on file for this visit.  Meds & Orders: No orders of the defined types were placed in this encounter.   Orders Placed This Encounter  Procedures  . Hand/Upper Extremity Injection/Arthrocentesis    Follow-up: Return in about 6 weeks (around 05/16/2016) for repeat ultrasound. and consideration of EMGs vs further cervical evaluation.  Procedures: US Guided Hydrodissection and Injection of Bilateral Carpal Tunnel Procedure Details Condition: carpal tunnel syndrome  Needle size: 25 G Medications administered: 1 mL bupivacaine 0.5 %; 40 mg methylPREDNISolone acetate 40 MG/ML     DESCRIPTION OF PROCEDURE: The patient's clinical condition is marked by substantial pain and/or significant functional disability. Other conservative therapy has not provided relief, is contraindicated, or not appropriate. There is a reasonable likelihood that injection will significantly improve the patient's pain and/or functional impairment.  After discussing the risks, benefits and expected outcomes of the injection and all questions were reviewed and answered, the patient wished to undergo the above named procedure.  Verbal consent was obtained. The target sight was prepped with alcohol scrub. Local anesthesia was obtained with ethyl chloride and 3mL of 1%  lidocaine on the injection needle.  Under real-time ultrasound guidance the target structure was injected using the above needle and medications under sterile stopcock technique. Left wrist was injected and attention was then turned to the right where the identical procedure was performed.  Complete hydro-dissection around the nerve was performed and images were stored. Band-Aid was applied, bilateral wrist splints provided. The patient tolerated this procedure well with no immediate complications. Post injection instructions were provided.    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  He has been smoking about 0.25 packs per day. He has never used smokeless tobacco.  Recent Labs  10/01/15 0947  HGBA1C 5.59f    Subjective: Chief Complaint  Patient presents with  . Left Wrist - Follow-up  . Right Wrist - Follow-up  . Follow-up    bilateral wrist pain    Recurrent B wrist pain following prior B CTS injections in 2013 with Dr. Luiz Blare. Having recurrent pain since cooking extensively on a grill following [employer] home coming. Severe pain in bilateral hands in median nerve distribution. Minimal neck pain.    Review of Systems  Constitutional: Negative for chills, diaphoresis, fever, malaise/fatigue and weight loss.  Neurological: Negative for weakness.    Objective:  VS:  HT:5\' 11"  (180.3 cm)   WT:225 lb (102.1 kg)  BMI:31.4    BP:133/89  HR:68bpm  TEMP: ( )  RESP:  Physical Exam  Constitutional: He appears well-developed and well-nourished. No distress.  Pulmonary/Chest: Effort normal. No respiratory distress.  Skin: He is not diaphoretic.  Psychiatric: He has a  normal mood and affect. Judgment normal.    Right Hand Exam   Tests  Phalen's Sign: positive Tinel's Sign (Medial Nerve): positive  Comments:  Diminished  grip strength. + Carpal Tunnel squeeze test Negative arm squeeze test Negative brachial plexus squeeze Radial pulse 2+/4, normal temperature   Left Hand Exam   Tests  Phalen's Sign: positive Tinel's Sign (Medial Nerve): positive  Comments:  Diminished grip strength. + Carpal Tunnel squeeze test Negative arm squeeze test Negative brachial plexus squeeze Radial pulse 2+/4, normal temperature     Imaging: No results found.  Past Medical/Family/Surgical/Social History: Patient Active Problem List   Diagnosis Date Noted  . OSA (obstructive sleep apnea) 02/17/2016  . Obesity (BMI 30.0-34.9) 12/02/2015  . Mild obstructive sleep apnea 10/01/2015  . HTN (hypertension) 10/01/2015  . Current smoker 10/01/2015  . Left leg claudication (HCC) 10/01/2015  . Elevated WBC count 10/01/2015  . Chronic low back pain without sciatica 10/01/2015  . Carpal tunnel syndrome 10/01/2015  . Hypokalemia 04/14/2015  . GERD (gastroesophageal reflux disease) 04/13/2015  . 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   Past Medical History:  Diagnosis Date  . Anxiety   . Arthritis   . Carpal tunnel syndrome   . Hypertension    Family History  Problem Relation Age of Onset  . Hypertension Mother   . Hypertension Sister   . Hypertension Brother    Past Surgical History:  Procedure Laterality Date  . BILATERAL ANTERIOR TOTAL HIP ARTHROPLASTY Bilateral 04/06/2015   Procedure: BILATERAL ANTERIOR TOTAL HIP ARTHROPLASTY;  Surgeon: Kathryne Hitchhristopher Y Blackman, MD;  Location: [facility] OR;  Service: Orthopedics;  Laterality: Bilateral;  . KNEE SURGERY Left    Social History   Occupational History  . Not on file.   Social History Main Topics  . Smoking status: Current Every Day Smoker    Packs/day: 0.25    Types: Cigarettes  . Smokeless tobacco: Never Used  . Alcohol use 3.6 oz/week    6 Cans of beer per week  . Drug use: No  . Sexual activity: Not on file

## 2016-04-04 NOTE — Progress Notes (Deleted)
   Spencer GenreKevin L Fischer - 50 y.o. male MRN [id number]  Date of birth: 09/02/1965  Office Visit Note: Visit Date: 04/04/2016 PCP: Lora PaulaFUNCHES, JOSALYN C, MD Referred by: Dessa PhiFunches, Josalyn, MD  Assessment & Plan: Visit Diagnoses:  1. Bilateral carpal tunnel syndrome      Plan:   Bilateral US Guided Hydro    There are no Patient Instructions on file for this visit.  Meds: No orders of the defined types were placed in this encounter.   Orders: Orders Placed This Encounter  Procedures  . Hand/Upper Extremity Injection/Arthrocentesis    Follow-up: Return in about 6 weeks (around 05/16/2016) for repeat ultrasound.   Procedures: No procedures performed  No notes on file   Clinical History: Findings:  Prior Cspine X-rays - multilevel degenerative changes. Most notable at C4/5 with anterior osteophytic spurring S/p CT Injections by Dr. Luiz BlareGraves in 2013 with splinting and good improvement.    No specialty comments available.  Subjective: Chief Complaint  Patient presents with  . Left Wrist - Follow-up  . Right Wrist - Follow-up  . Follow-up    bilateral wrist pain    States CTS in both hands, States he hasn't had sleep 2 days.  Having numbness and tingling in both hands.  Pain radiating into both arms.  Would like to have Cort. Injections?   ROS  Objective: He reports that he has been smoking Cigarettes.  He has been smoking about 0.25 packs per day. He has never used smokeless tobacco. VS:  HT:5\' 11"  (180.3 cm)   WT:225 lb (102.1 kg)  BMI:31.4        BP:133/89  HR:68bpm         TEMP: ( )  RESP:    Recent Labs  10/01/15 0947  HGBA1C 5.2719f    Physical Exam  Ortho Exam Imaging: No results found.  Past Medical/Family/Surgical/Social History: Patient Active Problem List   Diagnosis Date Noted  . OSA (obstructive sleep apnea) 02/17/2016  . Obesity (BMI 30.0-34.9) 12/02/2015  . Mild obstructive sleep apnea 10/01/2015  . HTN (hypertension) 10/01/2015  . Current smoker 10/01/2015    . Left leg claudication (HCC) 10/01/2015  . Elevated WBC count 10/01/2015  . Chronic low back pain without sciatica 10/01/2015  . Carpal tunnel syndrome 10/01/2015  . Hypokalemia 04/14/2015  . GERD (gastroesophageal reflux disease) 04/13/2015  . 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   Past Medical History:  Diagnosis Date  . Anxiety   . Arthritis   . Carpal tunnel syndrome   . Hypertension    Family History  Problem Relation Age of Onset  . Hypertension Mother   . Hypertension Sister   . Hypertension Brother    Past Surgical History:  Procedure Laterality Date  . BILATERAL ANTERIOR TOTAL HIP ARTHROPLASTY Bilateral 04/06/2015   Procedure: BILATERAL ANTERIOR TOTAL HIP ARTHROPLASTY;  Surgeon: Kathryne Hitchhristopher Y Blackman, MD;  Location: [facility] OR;  Service: Orthopedics;  Laterality: Bilateral;  . KNEE SURGERY Left    Social History   Occupational History  . Not on file.   Social History Main Topics  . Smoking status: Current Every Day Smoker    Packs/day: 0.25    Types: Cigarettes  . Smokeless tobacco: Never Used  . Alcohol use 3.6 oz/week    6 Cans of beer per week  . Drug use: No  . Sexual activity: Not on file

## 2016-04-04 NOTE — Progress Notes (Deleted)
Procedures

## 2016-04-05 ENCOUNTER — Ambulatory Visit: Payer: No Typology Code available for payment source | Admitting: Adult Health

## 2016-04-19 ENCOUNTER — Ambulatory Visit: Payer: No Typology Code available for payment source | Admitting: Adult Health

## 2016-04-24 ENCOUNTER — Ambulatory Visit: Payer: No Typology Code available for payment source | Admitting: Adult Health

## 2016-05-15 ENCOUNTER — Ambulatory Visit: Payer: No Typology Code available for payment source | Admitting: Adult Health

## 2016-05-16 ENCOUNTER — Ambulatory Visit (INDEPENDENT_AMBULATORY_CARE_PROVIDER_SITE_OTHER): Payer: Medicaid Other | Admitting: Sports Medicine

## 2016-05-23 ENCOUNTER — Ambulatory Visit: Payer: No Typology Code available for payment source | Admitting: Adult Health

## 2016-05-24 ENCOUNTER — Ambulatory Visit (INDEPENDENT_AMBULATORY_CARE_PROVIDER_SITE_OTHER): Payer: Medicaid Other | Admitting: Sports Medicine

## 2016-05-31 ENCOUNTER — Ambulatory Visit (INDEPENDENT_AMBULATORY_CARE_PROVIDER_SITE_OTHER): Payer: Medicaid Other

## 2016-05-31 ENCOUNTER — Encounter (INDEPENDENT_AMBULATORY_CARE_PROVIDER_SITE_OTHER): Payer: Self-pay | Admitting: Sports Medicine

## 2016-05-31 ENCOUNTER — Ambulatory Visit (INDEPENDENT_AMBULATORY_CARE_PROVIDER_SITE_OTHER): Payer: Medicaid Other | Admitting: Sports Medicine

## 2016-05-31 VITALS — BP 151/89 | HR 79 | Ht 71.0 in | Wt 225.0 lb

## 2016-05-31 DIAGNOSIS — M542 Cervicalgia: Secondary | ICD-10-CM

## 2016-05-31 DIAGNOSIS — G5603 Carpal tunnel syndrome, bilateral upper limbs: Secondary | ICD-10-CM

## 2016-05-31 MED ORDER — GABAPENTIN 300 MG PO CAPS: ORAL_CAPSULE | ORAL | 1 refills | Status: DC

## 2016-05-31 NOTE — Progress Notes (Signed)
Spencer Fischer - 50 y.o. male MRN [id number]  Date of birth: 01/03/1966  Office Visit Note: Visit Date: 05/31/2016 PCP: Lora PaulaFUNCHES, JOSALYN C, MD Referred by: Dessa PhiFunches, Josalyn, MD  Subjective: Chief Complaint  Patient presents with  . Right Hand - Follow-up  . Left Hand - Follow-up  . Follow-up    Patient states he is still having bilateral wrist pain, wearing wirst brace on both wirsts.  No change, states it feels like a pinched nerve in his neck.   HPI: Patient reports with persistent bilateral left greater than right hand pain arm pain and neck pain.  The pain is the point that he is having some difficulty with sleep maintenance.  He has been having issues with this for quite some time and underwent carpal tunnel injections previously with only mild improvement in his symptoms.  His prior median nerves were only mild to moderate fairly enlarged on ultrasound any has been having worsening neck pain since his arm and hand pain has been worsening as well.  Medications have not been significantly beneficial.  He has tried one of his wife's opioid medications and this did give him some relief of his symptoms.  He did no-show his follow-up appointment. ROS: Pt denies any change in bowel or bladder habits, muscle weakness, numbness or falls associated with this pain.. Otherwise per HPI.   Clinical History: No specialty comments available.  He reports that he has been smoking Cigarettes.  He has been smoking about 0.25 packs per day. He has never used smokeless tobacco.   Recent Labs  10/01/15 0947  HGBA1C 5.6954f    Assessment & Plan: Visit Diagnoses:    ICD-9-CM ICD-10-CM   1. Neck pain 723.1 M54.2 XR Cervical Spine 2 or 3 views     MR CERVICAL SPINE WO CONTRAST     Ambulatory referral to Physical Medicine Rehab  2. 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  Spent a long time discussing propria long-term treatment for this issue since it is a chronic issue.  Recommend against opioid therapy at this time and discuss the possibility of central sensitization syndrome and difficulty controlling pain if he doesn't requiring operative intervention.  Patient does voice understanding.  Declines tramadol.  Gabapentin has been prescribed. Follow-up: Return for after NCS and MRI of your neck with Dr. Magnus IvanBlackman.  Meds:  Meds ordered this encounter  Medications  . gabapentin (NEURONTIN) 300 MG capsule    Sig: Start with 1 tab po qhs X 1 week, then increase to 1 tab po bid X 1 week then 1 tab po tid prn    Dispense:  90 capsule    Refill:  1   Procedures: No notes on file   Objective:  VS:  HT:5\' 11"  (180.3 cm)   WT:225 lb (102.1 kg)  BMI:31.4    BP:(!) 151/89  HR:79bpm  TEMP: ( )  RESP:  Physical Exam:  Adult male. Alert and appropriate.  In no acute distress.  Upper extremities are overall well aligned with no significant deformity. No significant swelling.  Distal pulses 2+/4. No significant bruising/ecchymosis or erythema the skin Bilateral upper extremities: Pain with brachial plexus squeeze on the left and arm squeeze on the left.  Pain with Tinel's at the carpal tunnel and minimally over  the cubital tunnel.  Grip strength is intact bilaterally.  Upper extremity reflexes are symmetric at 2+/4.  He does have marked pain with Spurling's compression test to the left ipsilaterally.  Negative Lhermitte's compression test radial pulse 2+/4. Imaging: Xr Cervical Spine 2 Or 3 Views  Result Date: 06/01/2016 Findings: 2V Cervical Spine: Overall well aligned.  Large anterior osteophytes most notably at C4-5 and C5-6.  No acute fracture/dislocation.  Degenerative Impression: Moderate spondylosis most notably at C4-5 and C5-6   Past  Medical/Family/Surgical/Social History: Medications & Allergies reviewed per EMR Patient Active Problem List   Diagnosis Date Noted  . OSA (obstructive sleep apnea) 02/17/2016  . Obesity (BMI 30.0-34.9) 12/02/2015  . Mild obstructive sleep apnea 10/01/2015  . HTN (hypertension) 10/01/2015  . Current smoker 10/01/2015  . Left leg claudication (HCC) 10/01/2015  . Elevated WBC count 10/01/2015  . Chronic low back pain without sciatica 10/01/2015  . Carpal tunnel syndrome 10/01/2015  . Hypokalemia 04/14/2015  . GERD (gastroesophageal reflux disease) 04/13/2015  . 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   Past Medical History:  Diagnosis Date  . Anxiety   . Arthritis   . Carpal tunnel syndrome   . Hypertension    Family History  Problem Relation Age of Onset  . Hypertension Mother   . Hypertension Sister   . Hypertension Brother    Past Surgical History:  Procedure Laterality Date  . BILATERAL ANTERIOR TOTAL HIP ARTHROPLASTY Bilateral 04/06/2015   Procedure: BILATERAL ANTERIOR TOTAL HIP ARTHROPLASTY;  Surgeon: Kathryne Hitchhristopher Y Blackman, MD;  Location: [facility] OR;  Service: Orthopedics;  Laterality: Bilateral;  . KNEE SURGERY Left    Social History   Occupational History  . Not on file.   Social History Main Topics  . Smoking status: Current Every Day Smoker    Packs/day: 0.25    Types: Cigarettes  . Smokeless tobacco: Never Used  . Alcohol use 3.6 oz/week    6 Cans of beer per week  . Drug use: No  . Sexual activity: Not on file

## 2016-06-01 ENCOUNTER — Encounter: Payer: Self-pay | Admitting: Adult Health

## 2016-06-06 ENCOUNTER — Ambulatory Visit (INDEPENDENT_AMBULATORY_CARE_PROVIDER_SITE_OTHER): Payer: Medicaid Other | Admitting: Adult Health

## 2016-06-06 ENCOUNTER — Encounter: Payer: Self-pay | Admitting: Adult Health

## 2016-06-06 DIAGNOSIS — Z6831 Body mass index (BMI) 31.0-31.9, adult: Secondary | ICD-10-CM | POA: Diagnosis not present

## 2016-06-06 DIAGNOSIS — E669 Obesity, unspecified: Secondary | ICD-10-CM | POA: Insufficient documentation

## 2016-06-06 DIAGNOSIS — G4733 Obstructive sleep apnea (adult) (pediatric): Secondary | ICD-10-CM

## 2016-06-06 DIAGNOSIS — E6609 Other obesity due to excess calories: Secondary | ICD-10-CM

## 2016-06-06 NOTE — Patient Instructions (Addendum)
Wear C Pap each night. Order for new CPAP mask -nasal pillows w/ chin strap .  Goal is to wear your CPAP for at least 4 hours each night. Try to work on weight loss Do not drive if sleepy Follow-up with Dr. Clayborn BignesseDios in 4-6 months and As needed

## 2016-06-06 NOTE — Progress Notes (Signed)
 @Patient  ID: Spencer Fischer, male    DOB: 08/07/1965, 50 y.o.   MRN: [id number]  Chief Complaint  Patient presents with  . Follow-up    OSA     Referring provider: Dessa PhiFunches, Josalyn, MD  HPI: 50 yo male smoker followed for mild OSA   TEST  Sleep study 11/2015 [facility] 8.5   06/06/2016 Follow up : OSA  Pt returns for a 3 month follow up for OSA .  He remains on CPAP At bedtime   He says he is not wearing it that much, says he does not like the full face mask . We dsicussed nasal pillows.  Compliance is poor. We discussed CPAP compliance and potential complications of untreated OSA .  Discussed weight loss and not driving if sleepy .       No Known Allergies   There is no immunization history on file for this patient.  Past Medical History:  Diagnosis Date  . Anxiety   . Arthritis   . Carpal tunnel syndrome   . Hypertension     Tobacco History: History  Smoking Status  . Current Every Day Smoker  . Packs/day: 0.25  . Types: Cigarettes  Smokeless Tobacco  . Never Used   Ready to quit: No Counseling given: Yes   Outpatient Encounter Prescriptions as of 06/06/2016  Medication Sig  . gabapentin (NEURONTIN) 300 MG capsule Start with 1 tab po qhs X 1 week, then increase to 1 tab po bid X 1 week then 1 tab po tid prn  . Oxycodone HCl 10 MG TABS Take 1 tablet by mouth 3 (three) times daily as needed.  Marland Kitchen. aspirin 81 MG tablet Take 1 tablet (81 mg total) by mouth daily. (Patient not taking: Reported on 06/06/2016)  . docusate sodium (COLACE) 100 MG capsule Take 1 capsule (100 mg total) by mouth daily as needed for mild constipation. (Patient not taking: Reported on 06/06/2016)  . Menthol-Methyl Salicylate (MUSCLE RUB) 10-15 % CREA Apply 2 application topically 2 (two) times daily with breakfast and lunch. (Patient not taking: Reported on 06/06/2016)  . methocarbamol (ROBAXIN) 500 MG tablet Take 1 tablet (500 mg total) by mouth 2 (two) times daily. (Patient not taking: Reported  on 06/06/2016)  . naproxen (NAPROSYN) 375 MG tablet Take 375 mg by mouth 2 (two) times daily with a meal.  . senna (SENOKOT) 8.6 MG TABS tablet Take 2 tablets (17.2 mg total) by mouth 2 (two) times daily. For constipation (Patient not taking: Reported on 06/06/2016)  . triamterene-hydrochlorothiazide (MAXZIDE-25) 37.5-25 MG tablet Take 1 tablet by mouth daily. (Patient not taking: Reported on 06/06/2016)   No facility-administered encounter medications on file as of 06/06/2016.      Review of Systems  Constitutional:   No  weight loss, night sweats,  Fevers, chills, fatigue, or  lassitude.  HEENT:   No headaches,  Difficulty swallowing,  Tooth/dental problems, or  Sore throat,                No sneezing, itching, ear ache, nasal congestion, post nasal drip,   CV:  No chest pain,  Orthopnea, PND, swelling in lower extremities, anasarca, dizziness, palpitations, syncope.   GI  No heartburn, indigestion, abdominal pain, nausea, vomiting, diarrhea, change in bowel habits, loss of appetite, bloody stools.   Resp: No shortness of breath with exertion or at rest.  No excess mucus, no productive cough,  No non-productive cough,  No coughing up of blood.  No change in color  of mucus.  No wheezing.  No chest wall deformity  Skin: no rash or lesions.  GU: no dysuria, change in color of urine, no urgency or frequency.  No flank pain, no hematuria   MS:  No joint pain or swelling.  No decreased range of motion.  No back pain.    Physical Exam  BP (!) 140/98 (BP Location: Left Arm, Patient Position: Sitting, Cuff Size: Normal)   Pulse 83   Temp 98.2 F (36.8 C) (Oral)   Ht 5\' 11"  (1.803 m)   Wt 228 lb 12.8 oz (103.8 kg)   SpO2 98%   BMI 31.91 kg/m   GEN: A/Ox3; pleasant , NAD, obese    HEENT:  [place]/AT,  EACs-clear, TMs-wnl, NOSE-clear, THROAT-clear, no lesions, no postnasal drip or exudate noted. Class 2-3 MP airway   NECK:  Supple w/ fair ROM; no JVD; normal carotid impulses w/o  bruits; no thyromegaly or nodules palpated; no lymphadenopathy.    RESP  Clear  P & A; w/o, wheezes/ rales/ or rhonchi. no accessory muscle use, no dullness to percussion  CARD:  RRR, no m/r/g, no peripheral edema, pulses intact, no cyanosis or clubbing.  GI:   Soft & nt; nml bowel sounds; no organomegaly or masses detected.   Musco: Warm bil, no deformities or joint swelling noted.   Neuro: alert, no focal deficits noted.    Skin: Warm, no lesions or rashes  Psych:  No change in mood or affect. No depression or anxiety.  No memory loss.  Lab Results:  CBC    Component Value Date/Time   WBC 15.6 (H) 04/19/2015 0834   RBC 3.30 (L) 04/19/2015 0834   HGB 9.8 (L) 04/19/2015 0834   HCT 29.8 (L) 04/19/2015 0834   PLT 545 (H) 04/19/2015 0834   MCV 90.3 04/19/2015 0834   MCH 29.7 04/19/2015 0834   MCHC 32.9 04/19/2015 0834   RDW 14.1 04/19/2015 0834   LYMPHSABS 3.2 04/19/2015 0834   MONOABS 1.2 (H) 04/19/2015 0834   EOSABS 0.2 04/19/2015 0834   BASOSABS 0.0 04/19/2015 0834    BMET    Component Value Date/Time   NA 135 04/14/2015 0403   K 3.9 04/14/2015 0403   CL 95 (L) 04/14/2015 0403   CO2 29 04/14/2015 0403   GLUCOSE 110 (H) 04/14/2015 0403   BUN 13 04/14/2015 0403   CREATININE 0.94 04/14/2015 0403   CALCIUM 9.4 04/14/2015 0403   GFRNONAA >60 04/14/2015 0403   GFRAA >60 04/14/2015 0403    BNP No results found for: BNP  ProBNP No results found for: PROBNP  Imaging: Xr Cervical Spine 2 Or 3 Views  Result Date: 06/01/2016 Findings: 2V Cervical Spine: Overall well aligned.  Large anterior osteophytes most notably at C4-5 and C5-6.  No acute fracture/dislocation.  Degenerative Impression: Moderate spondylosis most notably at C4-5 and C5-6    Assessment & Plan:   Mild obstructive sleep apnea CPAP intolerance, try new mask   Plan  . Patient Instructions  Wear C Pap each night. Order for new CPAP mask -nasal pillows w/ chin strap .  Goal is to wear your  CPAP for at least 4 hours each night. Try to work on weight loss Do not drive if sleepy Follow-up with Dr. Clayborn BignesseDios in 4-6 months and As needed      Obesity Wt loss      Rubye Oaksammy Liara Holm, NP 06/06/2016

## 2016-06-06 NOTE — Addendum Note (Signed)
Addended by: Pamalee LeydenWIGGINS, Adlyn Fife J on: 06/06/2016 03:18 PM   Modules accepted: Orders

## 2016-06-06 NOTE — Assessment & Plan Note (Signed)
Wt loss  

## 2016-06-06 NOTE — Assessment & Plan Note (Signed)
CPAP intolerance, try new mask   Plan  . Patient Instructions  Wear C Pap each night. Order for new CPAP mask -nasal pillows w/ chin strap .  Goal is to wear your CPAP for at least 4 hours each night. Try to work on weight loss Do not drive if sleepy Follow-up with Dr. Clayborn BignesseDios in 4-6 months and As needed

## 2016-06-08 ENCOUNTER — Telehealth (INDEPENDENT_AMBULATORY_CARE_PROVIDER_SITE_OTHER): Payer: Self-pay | Admitting: Sports Medicine

## 2016-06-08 NOTE — Telephone Encounter (Signed)
I called patient to confirm that MRI is auth [facility] and is scheduled for tomorrow.

## 2016-06-08 NOTE — Telephone Encounter (Signed)
Pt called and stated [facility] does not have mri  appt for pt tomorrow at 530 but our system shows that he does. Can you please check on this as soon as possible? Pt number is [phone]

## 2016-06-09 ENCOUNTER — Ambulatory Visit
Admission: RE | Admit: 2016-06-09 | Discharge: 2016-06-09 | Disposition: A | Discharge status: Home / Self Care | Payer: Medicaid Other | Source: Ambulatory Visit | Attending: Sports Medicine | Admitting: Sports Medicine

## 2016-06-09 DIAGNOSIS — M542 Cervicalgia: Secondary | ICD-10-CM

## 2016-06-16 ENCOUNTER — Encounter (INDEPENDENT_AMBULATORY_CARE_PROVIDER_SITE_OTHER): Payer: Medicaid Other | Admitting: Physical Medicine and Rehabilitation

## 2016-06-22 ENCOUNTER — Encounter (INDEPENDENT_AMBULATORY_CARE_PROVIDER_SITE_OTHER): Payer: Medicaid Other | Admitting: Physical Medicine and Rehabilitation

## 2016-06-23 ENCOUNTER — Telehealth (INDEPENDENT_AMBULATORY_CARE_PROVIDER_SITE_OTHER): Payer: Self-pay | Admitting: Physical Medicine and Rehabilitation

## 2016-06-23 NOTE — Telephone Encounter (Signed)
Called back and spoke with woman who said she would have pt call back for appt

## 2016-06-23 NOTE — Progress Notes (Signed)
He came 30 minutes late for EMG/NCV this week so trying to reschedule. Will review with him when we see him

## 2016-06-23 NOTE — Telephone Encounter (Signed)
Patient called to schedule appt today (he didn't know with whom/ why) from past appts I see he has 2 scheduled and canceled appts with [facility] for a nerve conduction study. He says he wants to schedule that appt again. [phone] cb#

## 2016-06-27 ENCOUNTER — Telehealth (INDEPENDENT_AMBULATORY_CARE_PROVIDER_SITE_OTHER): Payer: Self-pay

## 2016-06-27 NOTE — Telephone Encounter (Signed)
Called and left voicemail advising patient to return call concerning an appointment for his NCS.  I do see that patient has an appointment for 07/05/16 with Dr. Alvester MorinNewton. Wanted to make sure and inform patient of Dr. Janeece Riggersrigby's message.

## 2016-06-27 NOTE — Telephone Encounter (Signed)
-----   Message from Andrena MewsMichael D Rigby, DO sent at 06/23/2016  9:34 AM EST ----- Pt is supposed to undergo NCS/EMGs to evaluate for possible CTS as well as rule out radiculopathy.  I'm concerned he may have double crush syndrome. If positive for CTS recommend f/u with Dr. Magnus IvanBlackman. If only radicular symptoms recommend for Dr. Alvester MorinNewton to manage patient. Looks like he is trying to have NCS/EMGs rescheduled as of this morning

## 2016-07-03 ENCOUNTER — Ambulatory Visit (INDEPENDENT_AMBULATORY_CARE_PROVIDER_SITE_OTHER): Payer: Medicaid Other | Admitting: Orthopaedic Surgery

## 2016-07-03 IMAGING — CR DG PORTABLE PELVIS
1 series · 1 of 1 positions shown · non-contrast
Comparison: None.

CLINICAL DATA: Status post bilateral hip arthroplasty

EXAM:
PORTABLE PELVIS 1-2 VIEWS

[AP]
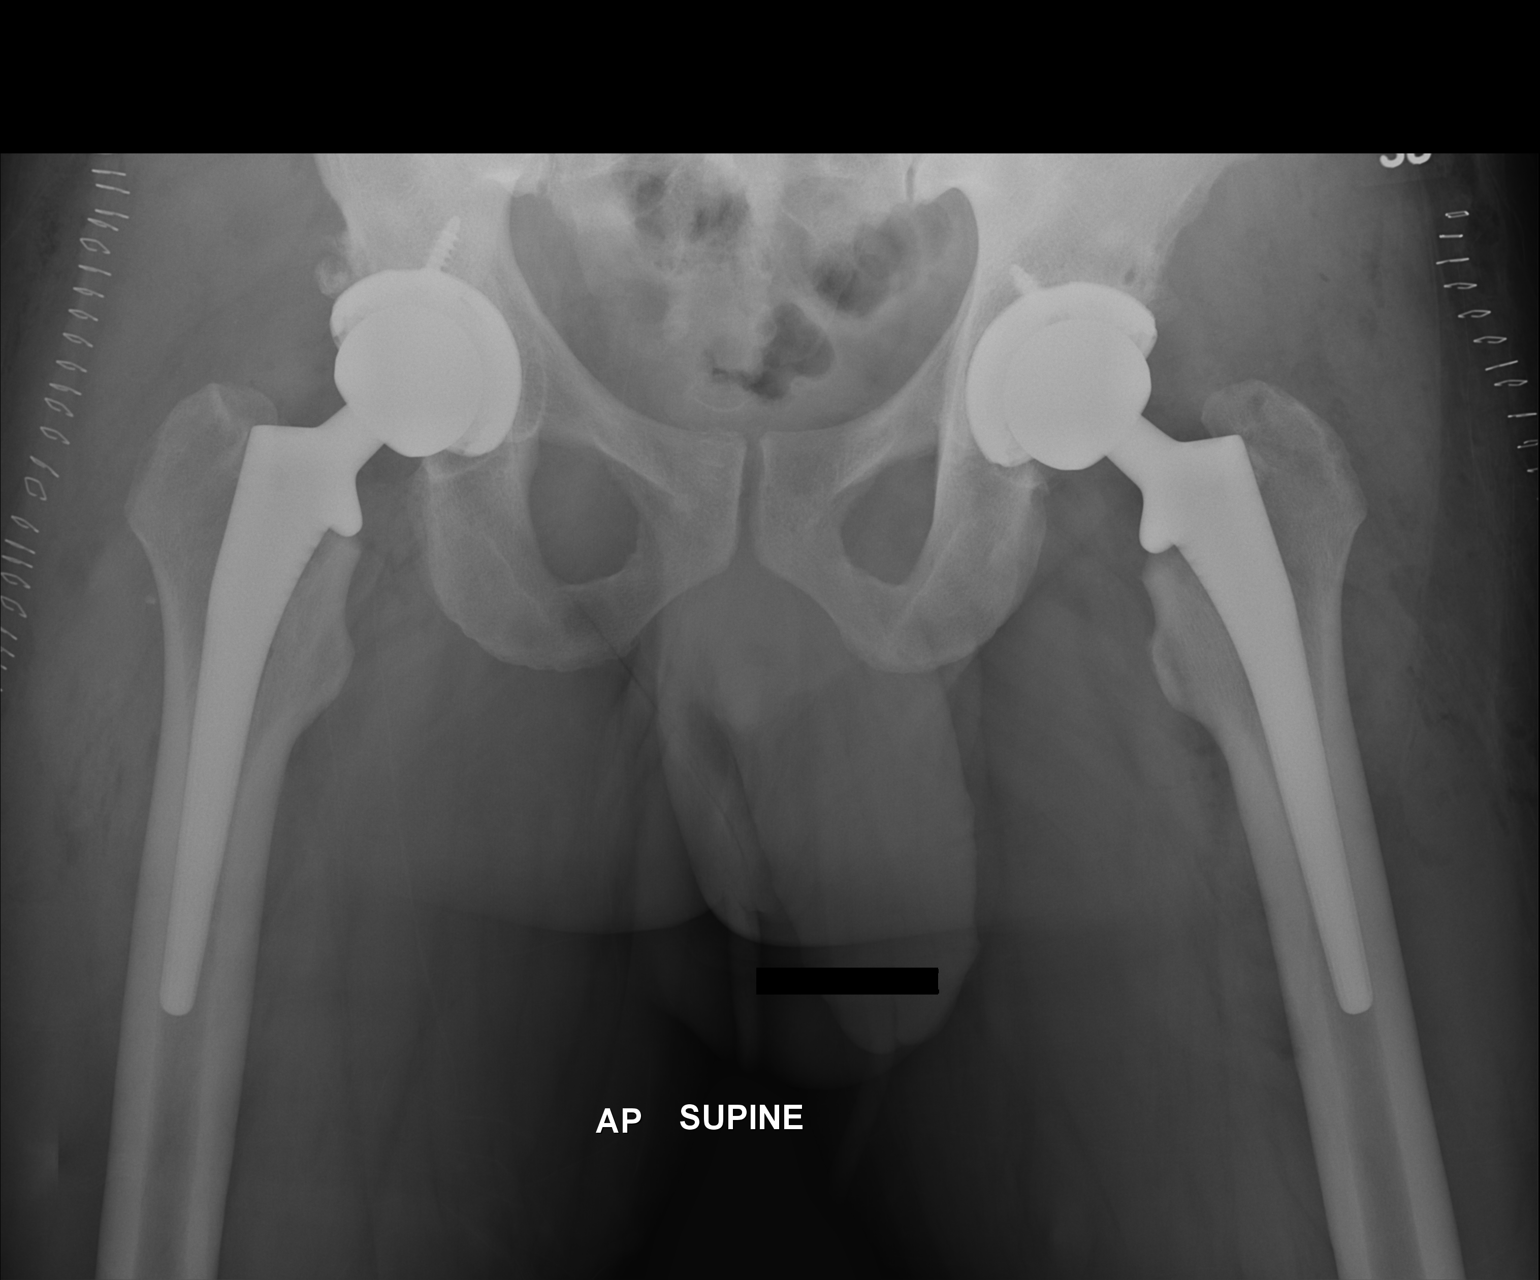

[1 of 1 positions shown; findings below may reference images not displayed]

FINDINGS: Bilateral hip replacements are noted. Pelvic ring appears intact. No
acute bony abnormality is seen.
IMPRESSION: Status post bilateral hip arthroplasty

## 2016-07-05 ENCOUNTER — Encounter (INDEPENDENT_AMBULATORY_CARE_PROVIDER_SITE_OTHER): Payer: Self-pay | Admitting: Physical Medicine and Rehabilitation

## 2016-07-05 ENCOUNTER — Ambulatory Visit (INDEPENDENT_AMBULATORY_CARE_PROVIDER_SITE_OTHER): Payer: Medicaid Other | Admitting: Physical Medicine and Rehabilitation

## 2016-07-05 DIAGNOSIS — R202 Paresthesia of skin: Secondary | ICD-10-CM

## 2016-07-05 NOTE — Progress Notes (Signed)
Spencer Fischer - 51 y.o. male MRN [id number]  Date of birth: 1966-04-13  Office Visit Note: Visit Date: 07/05/2016 PCP: Spencer Paula, MD Referred by: Spencer Phi, MD  Subjective: Chief Complaint  Patient presents with  . Right Arm - Pain  . Left Arm - Pain   HPI: Spencer Fischer is a right-hand dominant51 year old gentleman with prior hip replacement by Dr. Magnus Fischer who has been followed more recently by Dr. Berline Fischer. He has had numbness tingling and pain and weakness in the hands bilaterally with left more than right.  Pain, numbness and tingling in shoulder, wrist, hand. Left third finger is the worst. Worse at night- difficulty sleeping. Loses grip after about a minute. He has had carpal tunnel injections with good relief for several months but the symptoms have returned. He has been using braces. He has had medications without much relief. He doesn't report specific injury. No real frank radicular pain although he is having shoulder pain and tingling. MRI of the cervical spine is been done recently and this is reviewed below. There was some neuroforaminal narrowing which was moderate but nothing severe nothing compressed. The study was limited by motion.   ROS Otherwise per HPI.  Assessment & Plan: Visit Diagnoses:  1. Paresthesia of skin     Plan: Findings:  Impression: The above electrodiagnostic study is ABNORMAL and reveals evidence of a moderate bilateral median nerve entrapment at the wrist (carpal tunnel syndrome) affecting sensory and motor components.   There is no significant electrodiagnostic evidence of any other focal nerve entrapment, brachial plexopathy or generalized peripheral neuropathy.   Recommendations: 1.  Follow-up with referring physician. 2.  Continue current management of symptoms. 3.  Continue use of resting splint at night-time and as needed during the day. 4.  Suggest surgical evaluation.     Meds & Orders: No orders of the defined types were  placed in this encounter.   Orders Placed This Encounter  Procedures  . NCV with EMG (electromyography)    Follow-up: Return for scheduled appointment for Dr. Magnus Fischer per Dr. Berline Fischer.   Procedures: No procedures performed  EMG & NCV Findings: Evaluation of the left median motor and the right median motor nerves showed prolonged distal onset latency (L4.4, R4.5 ms) and decreased conduction velocity (Elbow-Wrist, L45, R48 m/s).  The left median (across palm) sensory nerve showed prolonged distal peak latency (Wrist, 5.5 ms).  The right median (across palm) sensory nerve showed no response (Palm) and prolonged distal peak latency (5.1 ms).  The right ulnar sensory nerve showed reduced amplitude (8.6 V).  All remaining nerves (as indicated in the following tables) were within normal limits.  Left vs. Right side comparison data for the ulnar motor nerve indicates abnormal L-R velocity difference (B Elbow-Wrist, 17 m/s) and abnormal L-R velocity difference (A Elbow-B Elbow, 19 m/s).  All remaining left vs. right side differences were within normal limits.    All examined muscles (as indicated in the following table) showed no evidence of electrical instability.    Impression: The above electrodiagnostic study is ABNORMAL and reveals evidence of a moderate bilateral median nerve entrapment at the wrist (carpal tunnel syndrome) affecting sensory and motor components.   There is no significant electrodiagnostic evidence of any other focal nerve entrapment, brachial plexopathy or generalized peripheral neuropathy.   Recommendations: 1.  Follow-up with referring physician. 2.  Continue current management of symptoms. 3.  Continue use of resting splint at night-time and as needed during the day. 4.  Suggest surgical evaluation.   Nerve Conduction Studies Anti Sensory Summary Table   Stim Site NR Peak (ms) Norm Peak (ms) P-T Amp (V) Norm P-T Amp Site1 Site2 Delta-P (ms) Dist (cm) Vel (m/s) Norm Vel  (m/s)  Left Median Acr Palm Anti Sensory (2nd Digit)  31.7C  Wrist    *5.5 <3.6 13.7 >10 Wrist Palm 3.7 0.0    Palm    1.8 <2.0 1.8         Right Median Acr Palm Anti Sensory (2nd Digit)  32.5C  Wrist    *5.1 <3.6 15.2 >10 Wrist Palm  0.0    Palm *NR  <2.0          Left Radial Anti Sensory (Base 1st Digit)  32.7C  Wrist    2.1 <3.1 29.1  Wrist Base 1st Digit 2.1 0.0    Right Radial Anti Sensory (Base 1st Digit)  33.2C  Wrist    2.1 <3.1 16.4  Wrist Base 1st Digit 2.1 0.0    Left Ulnar Anti Sensory (5th Digit)  32.4C  Wrist    3.3 <3.7 18.2 >15.0 Wrist 5th Digit 3.3 14.0 42 >38  Right Ulnar Anti Sensory (5th Digit)  32.9C  Wrist    3.4 <3.7 *8.6 >15.0 Wrist 5th Digit 3.4 14.0 41 >38   Motor Summary Table   Stim Site NR Onset (ms) Norm Onset (ms) O-P Amp (mV) Norm O-P Amp Site1 Site2 Delta-0 (ms) Dist (cm) Vel (m/s) Norm Vel (m/s)  Left Median Motor (Abd Poll Brev)  32.9C  Wrist    *4.4 <4.2 8.7 >5 Elbow Wrist 4.9 22.0 *45 >50  Elbow    9.3  5.8         Right Median Motor (Abd Poll Brev)  33C  Wrist    *4.5 <4.2 8.6 >5 Elbow Wrist 5.0 24.0 *48 >50  Elbow    9.5  6.8         Left Ulnar Motor (Abd Dig Min)  33.1C  Wrist    3.0 <4.2 7.1 >3 B Elbow Wrist 3.8 22.5 59 >53  B Elbow    6.8  9.4  A Elbow B Elbow 1.3 10.0 77 >53  A Elbow    8.1  9.2         Right Ulnar Motor (Abd Dig Min)  33C  Wrist    2.9 <4.2 6.7 >3 B Elbow Wrist 2.9 22.0 76 >53  B Elbow    5.8  7.1  A Elbow B Elbow 1.8 10.5 58 >53  A Elbow    7.6  7.4          EMG   Side Muscle Nerve Root Ins Act Fibs Psw Amp Dur Poly Recrt Int Dennie Bible Comment  Left 1stDorInt Ulnar C8-T1 Nml Nml Nml Nml Nml 0 Nml Nml   Left Abd Poll Brev Median C8-T1 Nml Nml Nml Nml Nml 0 Nml Nml   Left ExtDigCom   Nml Nml Nml Nml Nml 0 Nml Nml   Left Triceps Radial C6-7-8 Nml Nml Nml Nml Nml 0 Nml Nml     Nerve Conduction Studies Anti Sensory Left/Right Comparison   Stim Site L Lat (ms) R Lat (ms) L-R Lat (ms) L Amp (V) R Amp (V) L-R  Amp (%) Site1 Site2 L Vel (m/s) R Vel (m/s) L-R Vel (m/s)  Median Acr Palm Anti Sensory (2nd Digit)  31.7C  Wrist *5.5 *5.1 0.4 13.7 15.2 9.9 Wrist Palm     Palm 1.8  1.8         Radial Anti Sensory (Base 1st Digit)  32.7C  Wrist 2.1 2.1 0.0 29.1 16.4 43.6 Wrist Base 1st Digit     Ulnar Anti Sensory (5th Digit)  32.4C  Wrist 3.3 3.4 0.1 18.2 *8.6 52.7 Wrist 5th Digit 42 41 1   Motor Left/Right Comparison   Stim Site L Lat (ms) R Lat (ms) L-R Lat (ms) L Amp (mV) R Amp (mV) L-R Amp (%) Site1 Site2 L Vel (m/s) R Vel (m/s) L-R Vel (m/s)  Median Motor (Abd Poll Brev)  32.9C  Wrist *4.4 *4.5 0.1 8.7 8.6 1.1 Elbow Wrist *45 *48 3  Elbow 9.3 9.5 0.2 5.8 6.8 14.7       Ulnar Motor (Abd Dig Min)  33.1C  Wrist 3.0 2.9 0.1 7.1 6.7 5.6 B Elbow Wrist 59 76 *17  B Elbow 6.8 5.8 1.0 9.4 7.1 24.5 A Elbow B Elbow 77 58 *19  A Elbow 8.1 7.6 0.5 9.2 7.4 19.6             Clinical History: Cervical Spine MRI 06/10/2016  IMPRESSION: Mild motion degraded examination.  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  Moderate LEFT C4-5 and C5-6 neural foraminal narrowing.  He reports that he has been smoking Cigarettes.  He has been smoking about 0.25 packs per day. He has never used smokeless tobacco.   Recent Labs  10/01/15 0947  HGBA1C 5.3565f    Objective:  VS:  HT:    WT:   BMI:     BP:   HR: bpm  TEMP: ( )  RESP:  Physical Exam  Musculoskeletal:  Inspection reveals no atrophy of the bilateral APB or FDI or hand intrinsics. There is no swelling, color changes, allodynia or dystrophic changes. There is 5 out of 5 strength in the bilateral wrist extension, finger abduction and long finger flexion. There is intact sensation to light touch in all dermatomal and peripheral nerve distributions. He has an equivocally positive Phalen's test left more than right. There is a negative Hoffmann's test bilaterally.    Ortho Exam Imaging: No results found.  Past  Medical/Family/Surgical/Social History: Medications & Allergies reviewed per EMR Patient Active Problem List   Diagnosis Date Noted  . Obesity 06/06/2016  . OSA (obstructive sleep apnea) 02/17/2016  . Obesity (BMI 30.0-34.9) 12/02/2015  . Mild obstructive sleep apnea 10/01/2015  . HTN (hypertension) 10/01/2015  . Current smoker 10/01/2015  . Left leg claudication (HCC) 10/01/2015  . Elevated WBC count 10/01/2015  . Chronic low back pain without sciatica 10/01/2015  . Carpal tunnel syndrome 10/01/2015  . Hypokalemia 04/14/2015  . GERD (gastroesophageal reflux disease) 04/13/2015  . 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   Past Medical History:  Diagnosis Date  . Anxiety   . Arthritis   . Carpal tunnel syndrome   . Hypertension    Family History  Problem Relation Age of Onset  . Hypertension Mother   . Hypertension Sister   . Hypertension Brother    Past Surgical History:  Procedure Laterality Date  . BILATERAL ANTERIOR TOTAL HIP ARTHROPLASTY Bilateral 04/06/2015   Procedure: BILATERAL ANTERIOR TOTAL HIP ARTHROPLASTY;  Surgeon: Kathryne Hitchhristopher Y Blackman, MD;  Location: [facility] OR;  Service: Orthopedics;  Laterality: Bilateral;  . KNEE SURGERY Left    Social History   Occupational History  . Not on file.   Social History Main Topics  . Smoking status: Current Every Day Smoker    Packs/day: 0.25  Types: Cigarettes  . Smokeless tobacco: Never Used  . Alcohol use 3.6 oz/week    6 Cans of beer per week  . Drug use: No  . Sexual activity: Not on file

## 2016-07-05 NOTE — Procedures (Signed)
EMG & NCV Findings: Evaluation of the left median motor and the right median motor nerves showed prolonged distal onset latency (L4.4, R4.5 ms) and decreased conduction velocity (Elbow-Wrist, L45, R48 m/s).  The left median (across palm) sensory nerve showed prolonged distal peak latency (Wrist, 5.5 ms).  The right median (across palm) sensory nerve showed no response (Palm) and prolonged distal peak latency (5.1 ms).  The right ulnar sensory nerve showed reduced amplitude (8.6 V).  All remaining nerves (as indicated in the following tables) were within normal limits.  Left vs. Right side comparison data for the ulnar motor nerve indicates abnormal L-R velocity difference (B Elbow-Wrist, 17 m/s) and abnormal L-R velocity difference (A Elbow-B Elbow, 19 m/s).  All remaining left vs. right side differences were within normal limits.    All examined muscles (as indicated in the following table) showed no evidence of electrical instability.    Impression: The above electrodiagnostic study is ABNORMAL and reveals evidence of a moderate bilateral median nerve entrapment at the wrist (carpal tunnel syndrome) affecting sensory and motor components.   There is no significant electrodiagnostic evidence of any other focal nerve entrapment, brachial plexopathy or generalized peripheral neuropathy.   Recommendations: 1.  Follow-up with referring physician. 2.  Continue current management of symptoms. 3.  Continue use of resting splint at night-time and as needed during the day. 4.  Suggest surgical evaluation.   Nerve Conduction Studies Anti Sensory Summary Table   Stim Site NR Peak (ms) Norm Peak (ms) P-T Amp (V) Norm P-T Amp Site1 Site2 Delta-P (ms) Dist (cm) Vel (m/s) Norm Vel (m/s)  Left Median Acr Palm Anti Sensory (2nd Digit)  31.7C  Wrist    *5.5 <3.6 13.7 >10 Wrist Palm 3.7 0.0    Palm    1.8 <2.0 1.8         Right Median Acr Palm Anti Sensory (2nd Digit)  32.5C  Wrist    *5.1 <3.6 15.2 >10  Wrist Palm  0.0    Palm *NR  <2.0          Left Radial Anti Sensory (Base 1st Digit)  32.7C  Wrist    2.1 <3.1 29.1  Wrist Base 1st Digit 2.1 0.0    Right Radial Anti Sensory (Base 1st Digit)  33.2C  Wrist    2.1 <3.1 16.4  Wrist Base 1st Digit 2.1 0.0    Left Ulnar Anti Sensory (5th Digit)  32.4C  Wrist    3.3 <3.7 18.2 >15.0 Wrist 5th Digit 3.3 14.0 42 >38  Right Ulnar Anti Sensory (5th Digit)  32.9C  Wrist    3.4 <3.7 *8.6 >15.0 Wrist 5th Digit 3.4 14.0 41 >38   Motor Summary Table   Stim Site NR Onset (ms) Norm Onset (ms) O-P Amp (mV) Norm O-P Amp Site1 Site2 Delta-0 (ms) Dist (cm) Vel (m/s) Norm Vel (m/s)  Left Median Motor (Abd Poll Brev)  32.9C  Wrist    *4.4 <4.2 8.7 >5 Elbow Wrist 4.9 22.0 *45 >50  Elbow    9.3  5.8         Right Median Motor (Abd Poll Brev)  33C  Wrist    *4.5 <4.2 8.6 >5 Elbow Wrist 5.0 24.0 *48 >50  Elbow    9.5  6.8         Left Ulnar Motor (Abd Dig Min)  33.1C  Wrist    3.0 <4.2 7.1 >3 B Elbow Wrist 3.8 22.5 59 >53  B Elbow  6.8  9.4  A Elbow B Elbow 1.3 10.0 77 >53  A Elbow    8.1  9.2         Right Ulnar Motor (Abd Dig Min)  33C  Wrist    2.9 <4.2 6.7 >3 B Elbow Wrist 2.9 22.0 76 >53  B Elbow    5.8  7.1  A Elbow B Elbow 1.8 10.5 58 >53  A Elbow    7.6  7.4          EMG   Side Muscle Nerve Root Ins Act Fibs Psw Amp Dur Poly Recrt Int Dennie Bible Comment  Left 1stDorInt Ulnar C8-T1 Nml Nml Nml Nml Nml 0 Nml Nml   Left Abd Poll Brev Median C8-T1 Nml Nml Nml Nml Nml 0 Nml Nml   Left ExtDigCom   Nml Nml Nml Nml Nml 0 Nml Nml   Left Triceps Radial C6-7-8 Nml Nml Nml Nml Nml 0 Nml Nml     Nerve Conduction Studies Anti Sensory Left/Right Comparison   Stim Site L Lat (ms) R Lat (ms) L-R Lat (ms) L Amp (V) R Amp (V) L-R Amp (%) Site1 Site2 L Vel (m/s) R Vel (m/s) L-R Vel (m/s)  Median Acr Palm Anti Sensory (2nd Digit)  31.7C  Wrist *5.5 *5.1 0.4 13.7 15.2 9.9 Wrist Palm     Palm 1.8   1.8         Radial Anti Sensory (Base 1st Digit)  32.7C   Wrist 2.1 2.1 0.0 29.1 16.4 43.6 Wrist Base 1st Digit     Ulnar Anti Sensory (5th Digit)  32.4C  Wrist 3.3 3.4 0.1 18.2 *8.6 52.7 Wrist 5th Digit 42 41 1   Motor Left/Right Comparison   Stim Site L Lat (ms) R Lat (ms) L-R Lat (ms) L Amp (mV) R Amp (mV) L-R Amp (%) Site1 Site2 L Vel (m/s) R Vel (m/s) L-R Vel (m/s)  Median Motor (Abd Poll Brev)  32.9C  Wrist *4.4 *4.5 0.1 8.7 8.6 1.1 Elbow Wrist *45 *48 3  Elbow 9.3 9.5 0.2 5.8 6.8 14.7       Ulnar Motor (Abd Dig Min)  33.1C  Wrist 3.0 2.9 0.1 7.1 6.7 5.6 B Elbow Wrist 59 76 *17  B Elbow 6.8 5.8 1.0 9.4 7.1 24.5 A Elbow B Elbow 77 58 *19  A Elbow 8.1 7.6 0.5 9.2 7.4 19.6

## 2016-07-06 ENCOUNTER — Telehealth (INDEPENDENT_AMBULATORY_CARE_PROVIDER_SITE_OTHER): Payer: Self-pay | Admitting: *Deleted

## 2016-07-06 ENCOUNTER — Other Ambulatory Visit (INDEPENDENT_AMBULATORY_CARE_PROVIDER_SITE_OTHER): Payer: Self-pay

## 2016-07-06 MED ORDER — METHOCARBAMOL 500 MG PO TABS: 500.0000 mg | ORAL_TABLET | Freq: Two times a day (BID) | ORAL | 0 refills | Status: DC

## 2016-07-06 NOTE — Telephone Encounter (Signed)
He had gotten it last actually from the ED went he went there for back spasm.  Let him know that he can have this once and call in Robaxin 500 mg, 1 po twice daily, prn spasms, #60, no refills. Thanks

## 2016-07-06 NOTE — Telephone Encounter (Signed)
Pt called stating he was supposed to have a muscle relaxer called in. He sees multiple Drs here but thinks Dr. Magnus IvanBlackman is the one that wrote this RX

## 2016-07-06 NOTE — Telephone Encounter (Signed)
Called into pharmacy. Patient aware. 

## 2016-07-06 NOTE — Progress Notes (Unsigned)
rob

## 2016-07-06 NOTE — Telephone Encounter (Signed)
Please advise, it doesn't look like it was you that filled muscle relaxer. Do you want to refill for him?

## 2016-07-06 NOTE — Telephone Encounter (Signed)
Called into pharmacy

## 2016-07-12 ENCOUNTER — Telehealth (INDEPENDENT_AMBULATORY_CARE_PROVIDER_SITE_OTHER): Payer: Self-pay | Admitting: Orthopaedic Surgery

## 2016-07-12 ENCOUNTER — Other Ambulatory Visit (INDEPENDENT_AMBULATORY_CARE_PROVIDER_SITE_OTHER): Payer: Self-pay | Admitting: Orthopaedic Surgery

## 2016-07-12 MED ORDER — TIZANIDINE HCL 4 MG PO TABS: 4.0000 mg | ORAL_TABLET | Freq: Three times a day (TID) | ORAL | 0 refills | Status: DC | PRN

## 2016-07-12 NOTE — Telephone Encounter (Signed)
Please advise 

## 2016-07-12 NOTE — Telephone Encounter (Signed)
PATIENT CALLED NEEDING A RX FOR BACK SPASMS. HE SAID HE CAN BARELY WALK. CB # [phone] THANKS!

## 2016-07-12 NOTE — Telephone Encounter (Signed)
Patient aware this was called in for him  

## 2016-07-12 NOTE — Telephone Encounter (Signed)
I sent in some Zanaflex. 

## 2016-07-13 NOTE — Telephone Encounter (Signed)
ERROR

## 2016-07-16 IMAGING — CR DG CHEST 2V
2 series · 2 of 2 positions shown · non-contrast
Comparison: 11/04/2008

CLINICAL DATA: Elevated white blood cell count. Fever. Recent
bilateral hip replacements.

EXAM:
CHEST  2 VIEW

[chest lat]
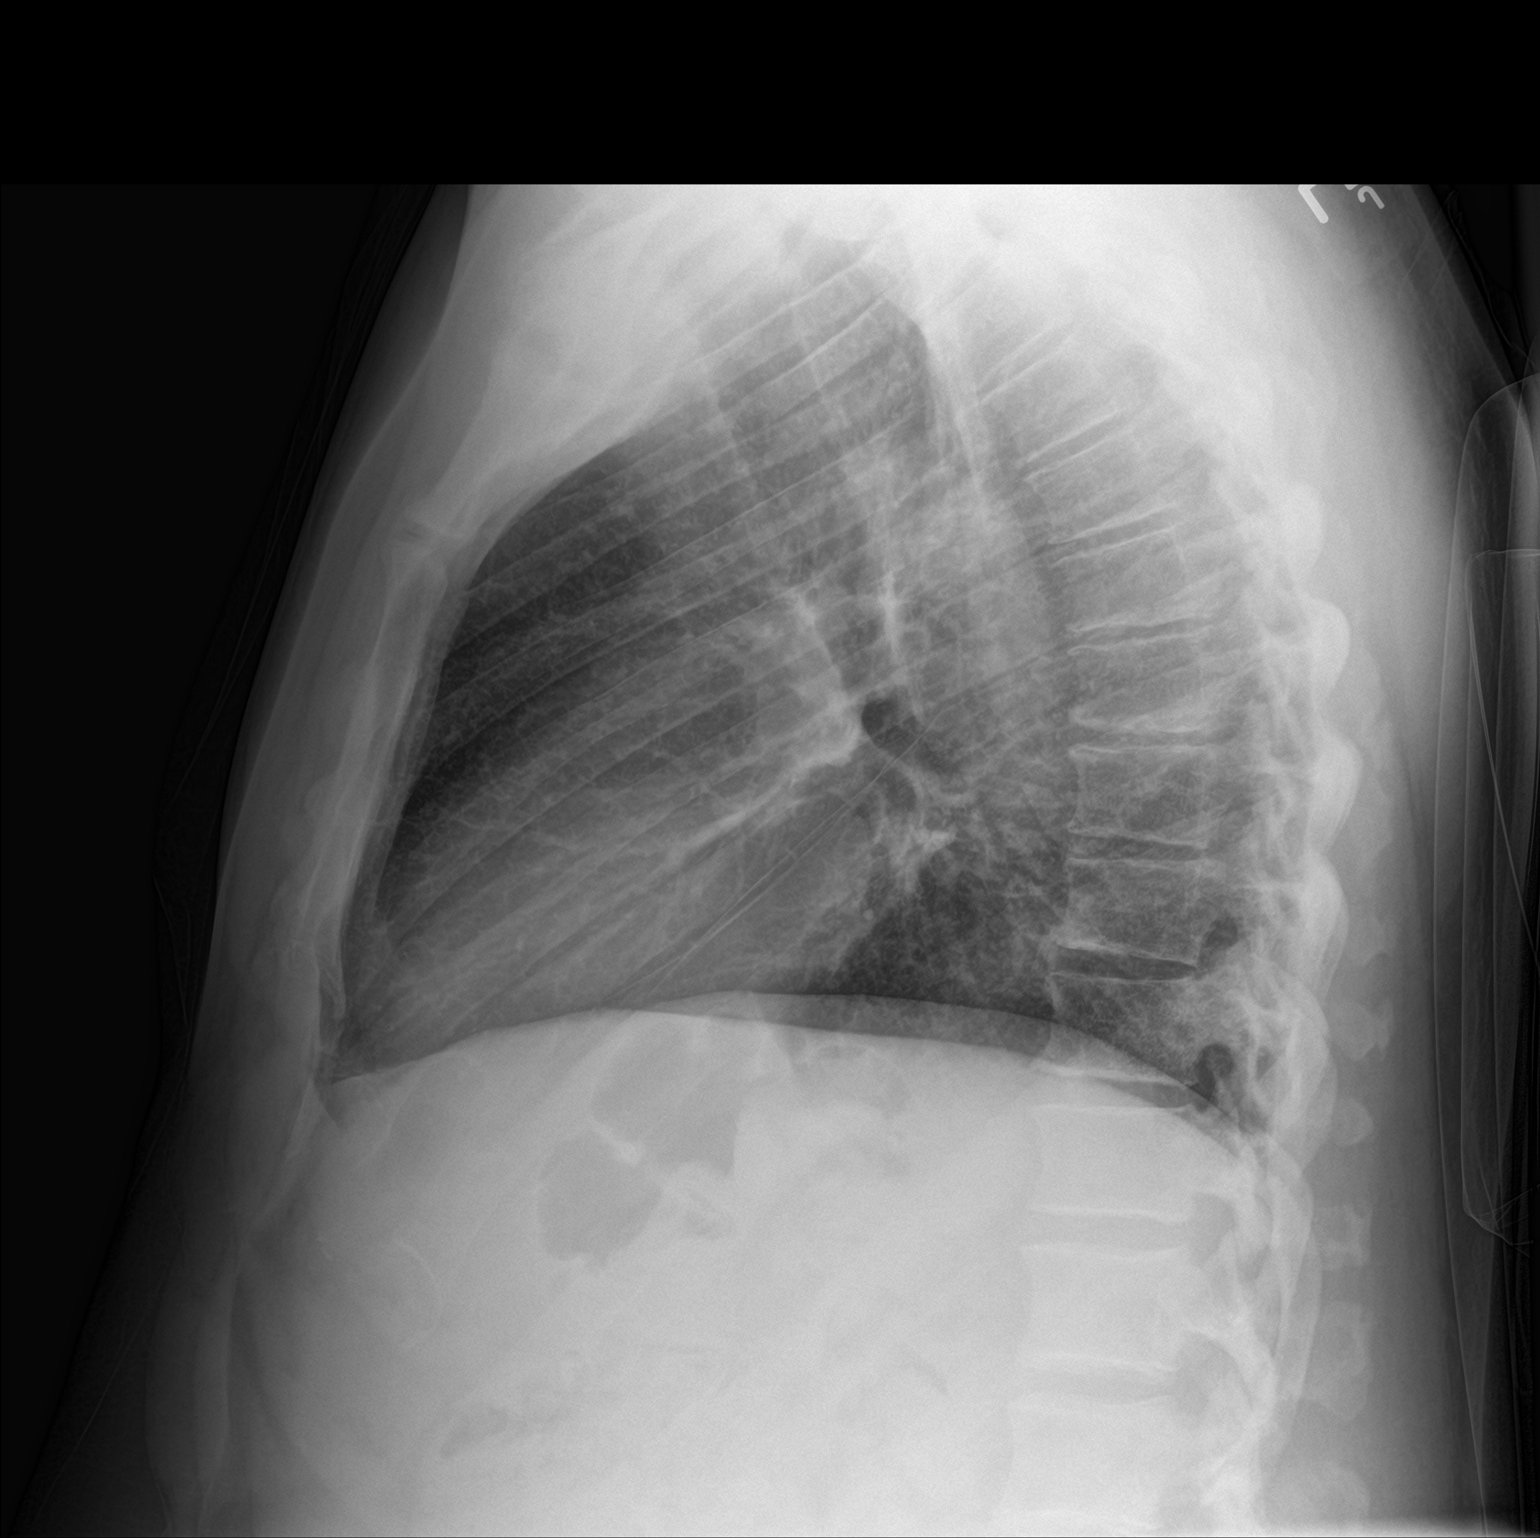

[chest ap]
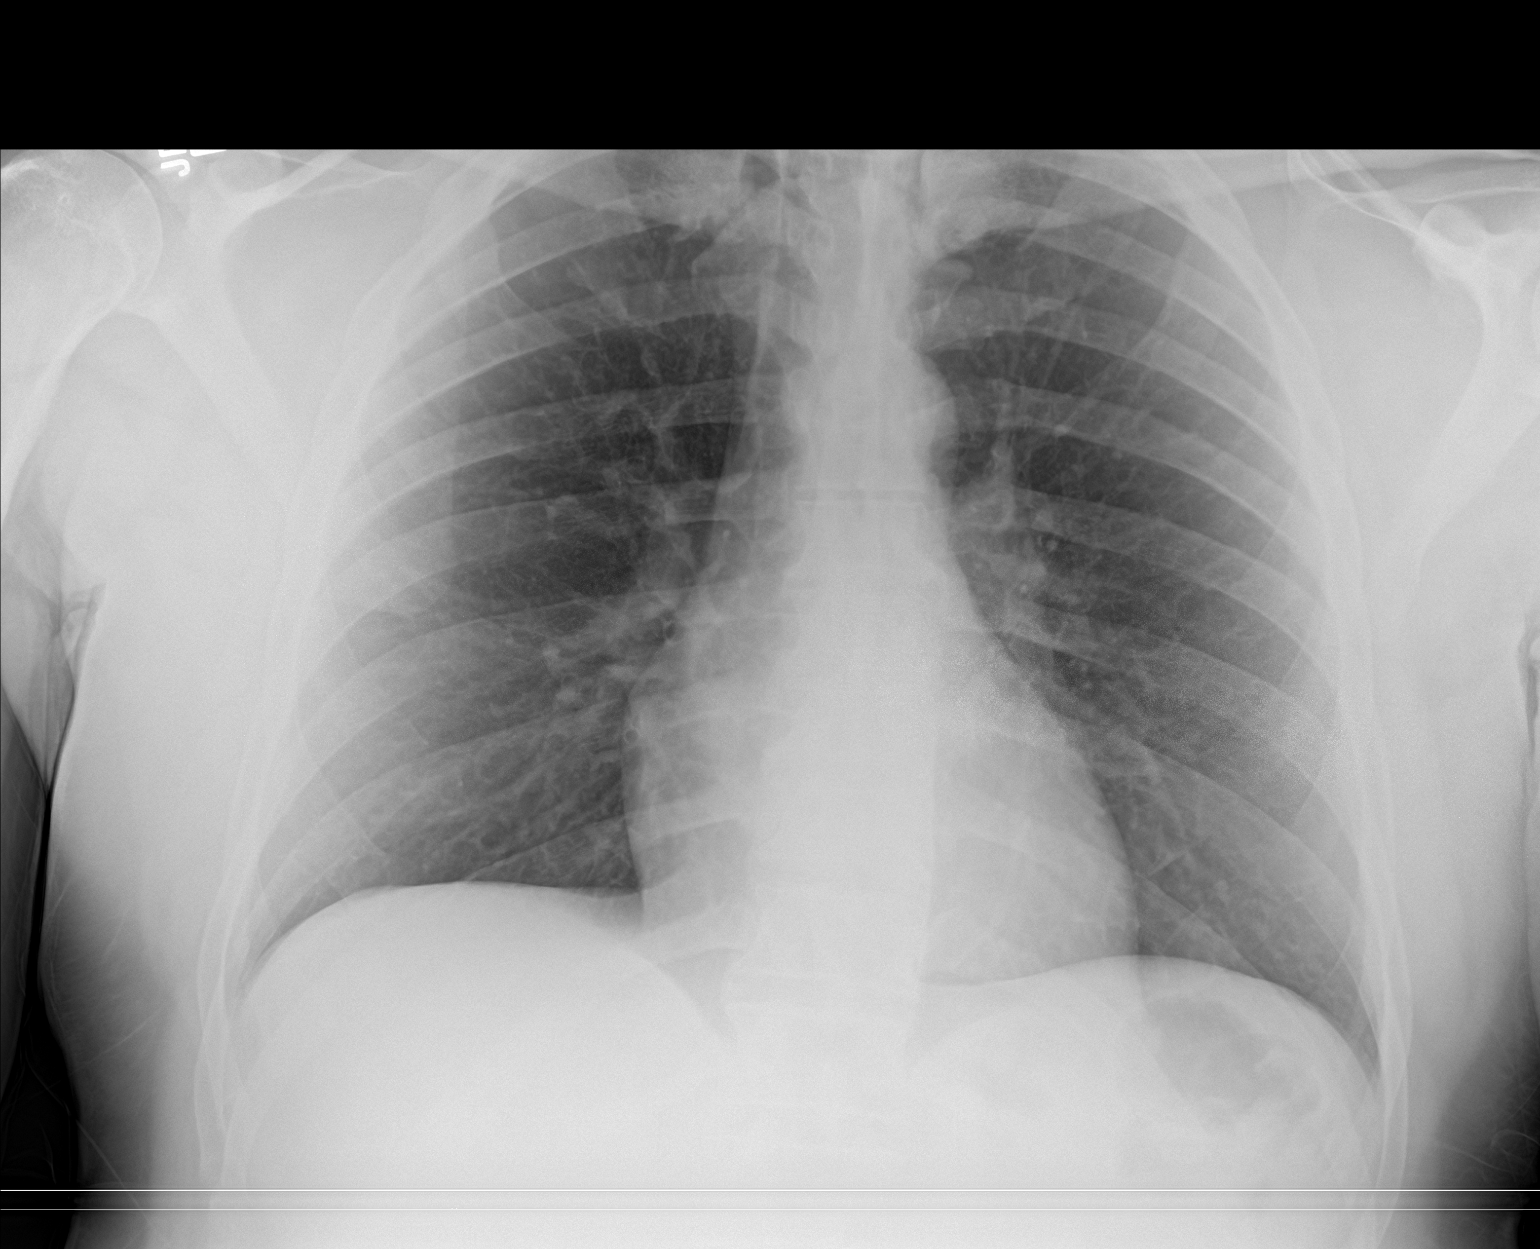

[2 of 2 positions shown; findings below may reference images not displayed]

FINDINGS: The heart size and mediastinal contours are within normal limits.
Both lungs are clear. No pleural effusion or pneumothorax. The
visualized skeletal structures are unremarkable.
IMPRESSION: No active cardiopulmonary disease.

## 2016-07-25 ENCOUNTER — Ambulatory Visit (INDEPENDENT_AMBULATORY_CARE_PROVIDER_SITE_OTHER): Payer: Medicaid Other | Admitting: Orthopaedic Surgery

## 2016-08-08 ENCOUNTER — Ambulatory Visit (INDEPENDENT_AMBULATORY_CARE_PROVIDER_SITE_OTHER): Payer: Medicaid Other | Admitting: Orthopaedic Surgery

## 2016-08-09 ENCOUNTER — Encounter (INDEPENDENT_AMBULATORY_CARE_PROVIDER_SITE_OTHER): Payer: Self-pay | Admitting: Orthopaedic Surgery

## 2016-08-09 ENCOUNTER — Encounter (INDEPENDENT_AMBULATORY_CARE_PROVIDER_SITE_OTHER): Payer: Self-pay

## 2016-08-09 ENCOUNTER — Ambulatory Visit (INDEPENDENT_AMBULATORY_CARE_PROVIDER_SITE_OTHER): Payer: Medicaid Other | Admitting: Orthopaedic Surgery

## 2016-08-09 DIAGNOSIS — G8929 Other chronic pain: Secondary | ICD-10-CM | POA: Diagnosis not present

## 2016-08-09 DIAGNOSIS — G5602 Carpal tunnel syndrome, left upper limb: Secondary | ICD-10-CM | POA: Diagnosis not present

## 2016-08-09 DIAGNOSIS — M545 Low back pain: Secondary | ICD-10-CM

## 2016-08-09 NOTE — Progress Notes (Signed)
The patient is here for review of bilateral upper extremity nerve conduction studies. He has known numbness and tingling in both his hands with his left being worse than his right. This does wake him up at night infection during the day as well. He also has chronic neck pain and chronic low back pain. We performed bilateral hip replacements on as well. He's never had MRI of his lumbar spine.  He's had MRI of the cervical spine that does show some foraminal stenosis at C4-C5 but no nerve impingement and no central stenosis. Is mild arthritic changes in his neck. The nerve conduction studies of his bilateral upper extremities show moderate bilateral carpal tunnel syndrome. I share with him the results of both of those studies. As far as his back goes he has pain with flexion extension of his lumbar spine. Previous spine films show well-maintained disc heights but certainly some arthritic changes. He's having our [place] symptoms going down his right side. He does have a positive straight leg raise on the right side.  At this point we are recommending a carpal tunnel release on both sides but we will start with the left first is more symptomatically. Given the EMG findings and his clinical exam is definitely medically warranted at this point. I explained in detail with the risks and benefits of the surgery involves. With see him back in 2 weeks postoperative for suture removal. We would then proceed with the right hand and wrist once he is healed the left side. We do need an MRI of this point of his lumbar spine due to his chronic pain and radicular symptoms to see if there is any pathology would warrant another intervention for the back.

## 2016-08-11 ENCOUNTER — Other Ambulatory Visit (INDEPENDENT_AMBULATORY_CARE_PROVIDER_SITE_OTHER): Payer: Self-pay

## 2016-08-11 DIAGNOSIS — M545 Low back pain: Secondary | ICD-10-CM

## 2016-08-17 ENCOUNTER — Other Ambulatory Visit (INDEPENDENT_AMBULATORY_CARE_PROVIDER_SITE_OTHER): Payer: Self-pay | Admitting: Physician Assistant

## 2016-08-22 ENCOUNTER — Encounter (HOSPITAL_COMMUNITY): Payer: Self-pay | Admitting: *Deleted

## 2016-08-22 NOTE — Progress Notes (Signed)
Pt denies SOB, chest pain, and being under the care of a cardiologist. Pt denies having a stress test, echo and cardiac cath. Pt denies having an EKG and chest x ray within the last year. Pt denies having recent labs. Pt made aware to stop taking  Aspirin, vitamins, fish oil and herbal medications. Do not take any NSAIDs ie: Ibuprofen, Advil, Naproxen, BC and Goody Powder or any medication containing Aspirin. Pt verbalized understanding of all pre-op instructions. 

## 2016-08-23 ENCOUNTER — Telehealth (INDEPENDENT_AMBULATORY_CARE_PROVIDER_SITE_OTHER): Payer: Self-pay | Admitting: Orthopaedic Surgery

## 2016-08-23 NOTE — Telephone Encounter (Signed)
[facility]OR[facility]-05-2016 [phone]

## 2016-08-24 ENCOUNTER — Encounter (HOSPITAL_COMMUNITY): Payer: Self-pay | Admitting: Urology

## 2016-08-24 ENCOUNTER — Ambulatory Visit (HOSPITAL_COMMUNITY)
Admission: RE | Admit: 2016-08-24 | Discharge: 2016-08-24 | Disposition: A | Discharge status: Home / Self Care | Payer: Medicare Other | Source: Ambulatory Visit | Attending: Orthopaedic Surgery | Admitting: Orthopaedic Surgery

## 2016-08-24 ENCOUNTER — Ambulatory Visit (HOSPITAL_COMMUNITY): Payer: Medicare Other | Admitting: Certified Registered"

## 2016-08-24 ENCOUNTER — Encounter (HOSPITAL_COMMUNITY)
Admission: RE | Disposition: A | Discharge status: Home / Self Care | Payer: Self-pay | Source: Ambulatory Visit | Attending: Orthopaedic Surgery

## 2016-08-24 DIAGNOSIS — I10 Essential (primary) hypertension: Secondary | ICD-10-CM | POA: Insufficient documentation

## 2016-08-24 DIAGNOSIS — F17210 Nicotine dependence, cigarettes, uncomplicated: Secondary | ICD-10-CM | POA: Diagnosis not present

## 2016-08-24 DIAGNOSIS — F329 Major depressive disorder, single episode, unspecified: Secondary | ICD-10-CM | POA: Insufficient documentation

## 2016-08-24 DIAGNOSIS — G5603 Carpal tunnel syndrome, bilateral upper limbs: Secondary | ICD-10-CM | POA: Insufficient documentation

## 2016-08-24 DIAGNOSIS — M1990 Unspecified osteoarthritis, unspecified site: Secondary | ICD-10-CM | POA: Diagnosis not present

## 2016-08-24 DIAGNOSIS — F419 Anxiety disorder, unspecified: Secondary | ICD-10-CM | POA: Diagnosis not present

## 2016-08-24 DIAGNOSIS — G5602 Carpal tunnel syndrome, left upper limb: Secondary | ICD-10-CM | POA: Diagnosis not present

## 2016-08-24 DIAGNOSIS — K219 Gastro-esophageal reflux disease without esophagitis: Secondary | ICD-10-CM | POA: Diagnosis not present

## 2016-08-24 DIAGNOSIS — G4730 Sleep apnea, unspecified: Secondary | ICD-10-CM | POA: Diagnosis not present

## 2016-08-24 DIAGNOSIS — Z79899 Other long term (current) drug therapy: Secondary | ICD-10-CM | POA: Insufficient documentation

## 2016-08-24 HISTORY — DX: Sleep apnea, unspecified: G47.30

## 2016-08-24 HISTORY — DX: Major depressive disorder, single episode, unspecified: F32.9

## 2016-08-24 HISTORY — DX: Carpal tunnel syndrome, left upper limb: G56.02

## 2016-08-24 HISTORY — PX: CARPAL TUNNEL RELEASE: SHX101

## 2016-08-24 HISTORY — DX: Depression, unspecified: F32.A

## 2016-08-24 LAB — CBC
HEMATOCRIT: 44.2 % (ref 39.0–52.0)
HEMOGLOBIN: 15.3 g/dL (ref 13.0–17.0)
MCH: 31.5 pg (ref 26.0–34.0)
MCHC: 34.6 g/dL (ref 30.0–36.0)
MCV: 90.9 fL (ref 78.0–100.0)
Platelets: 211 10*3/uL (ref 150–400)
RBC: 4.86 MIL/uL (ref 4.22–5.81)
RDW: 14.8 % (ref 11.5–15.5)
WBC: 7.5 10*3/uL (ref 4.0–10.5)

## 2016-08-24 LAB — BASIC METABOLIC PANEL
Anion gap: 10 (ref 5–15)
BUN: 10 mg/dL (ref 6–20)
CHLORIDE: 105 mmol/L (ref 101–111)
CO2: 21 mmol/L — AB (ref 22–32)
CREATININE: 0.94 mg/dL (ref 0.61–1.24)
Calcium: 9.2 mg/dL (ref 8.9–10.3)
GFR calc Af Amer: >60 mL/min (ref >60)
GFR calc non Af Amer: >60 mL/min (ref >60)
Glucose, Bld: 101 mg/dL — ABNORMAL HIGH (ref 65–99)
Potassium: 4.3 mmol/L (ref 3.5–5.1)
Sodium: 136 mmol/L (ref 135–145)

## 2016-08-24 SURGERY — CARPAL TUNNEL RELEASE
Anesthesia: Monitor Anesthesia Care | Laterality: Left

## 2016-08-24 MED ORDER — LIDOCAINE 2% (20 MG/ML) 5 ML SYRINGE
INTRAMUSCULAR | Status: AC
  Filled 2016-08-24: qty 5

## 2016-08-24 MED ORDER — PROPOFOL 10 MG/ML IV BOLUS
INTRAVENOUS | Status: DC | PRN
  Administered 2016-08-24: 40 mg via INTRAVENOUS

## 2016-08-24 MED ORDER — PROPOFOL 10 MG/ML IV BOLUS
INTRAVENOUS | Status: AC
  Filled 2016-08-24: qty 20

## 2016-08-24 MED ORDER — MIDAZOLAM HCL 5 MG/5ML IJ SOLN
INTRAMUSCULAR | Status: DC | PRN
  Administered 2016-08-24: 2 mg via INTRAVENOUS

## 2016-08-24 MED ORDER — MEPERIDINE HCL 25 MG/ML IJ SOLN: 6.2500 mg | INTRAMUSCULAR | Status: DC | PRN

## 2016-08-24 MED ORDER — OXYCODONE HCL 10 MG PO TABS: 10.0000 mg | ORAL_TABLET | Freq: Four times a day (QID) | ORAL | 0 refills | Status: DC | PRN

## 2016-08-24 MED ORDER — ONDANSETRON HCL 4 MG/2ML IJ SOLN
INTRAMUSCULAR | Status: DC | PRN
  Administered 2016-08-24: 4 mg via INTRAVENOUS

## 2016-08-24 MED ORDER — BUPIVACAINE HCL (PF) 0.25 % IJ SOLN
INTRAMUSCULAR | Status: AC
  Filled 2016-08-24: qty 30

## 2016-08-24 MED ORDER — ONDANSETRON HCL 4 MG/2ML IJ SOLN: 4.0000 mg | Freq: Once | INTRAMUSCULAR | Status: DC | PRN

## 2016-08-24 MED ORDER — BUPIVACAINE HCL 0.25 % IJ SOLN
INTRAMUSCULAR | Status: DC | PRN
  Administered 2016-08-24: 5 mL

## 2016-08-24 MED ORDER — PROPOFOL 500 MG/50ML IV EMUL
INTRAVENOUS | Status: DC | PRN
  Administered 2016-08-24: 50 ug/kg/min via INTRAVENOUS

## 2016-08-24 MED ORDER — FENTANYL CITRATE (PF) 100 MCG/2ML IJ SOLN
INTRAMUSCULAR | Status: AC
  Filled 2016-08-24: qty 4

## 2016-08-24 MED ORDER — HYDROMORPHONE HCL 1 MG/ML IJ SOLN
0.2500 mg | INTRAMUSCULAR | Status: DC | PRN
Start: 2016-08-24 — End: 2016-08-24

## 2016-08-24 MED ORDER — MIDAZOLAM HCL 2 MG/2ML IJ SOLN
INTRAMUSCULAR | Status: AC
  Filled 2016-08-24: qty 2

## 2016-08-24 MED ORDER — LIDOCAINE 2% (20 MG/ML) 5 ML SYRINGE
INTRAMUSCULAR | Status: DC | PRN
  Administered 2016-08-24: 60 mg via INTRAVENOUS

## 2016-08-24 MED ORDER — CEFAZOLIN SODIUM-DEXTROSE 2-4 GM/100ML-% IV SOLN
2.0000 g | INTRAVENOUS | Status: AC
  Administered 2016-08-24: 2 g via INTRAVENOUS
  Filled 2016-08-24: qty 100

## 2016-08-24 MED ORDER — 0.9 % SODIUM CHLORIDE (POUR BTL) OPTIME
TOPICAL | Status: DC | PRN
  Administered 2016-08-24: 1000 mL

## 2016-08-24 MED ORDER — LIDOCAINE HCL (PF) 1 % IJ SOLN
INTRAMUSCULAR | Status: AC
  Filled 2016-08-24: qty 30

## 2016-08-24 MED ORDER — LIDOCAINE HCL 1 % IJ SOLN
INTRAMUSCULAR | Status: DC | PRN
  Administered 2016-08-24: 5 mL

## 2016-08-24 MED ORDER — CHLORHEXIDINE GLUCONATE 4 % EX LIQD: 60.0000 mL | Freq: Once | CUTANEOUS | Status: DC

## 2016-08-24 MED ORDER — LACTATED RINGERS IV SOLN
INTRAVENOUS | Status: DC
Start: 2016-08-24 — End: 2016-08-24
  Administered 2016-08-24 (×2): via INTRAVENOUS

## 2016-08-24 MED ORDER — ONDANSETRON HCL 4 MG/2ML IJ SOLN
INTRAMUSCULAR | Status: AC
  Filled 2016-08-24: qty 2

## 2016-08-24 MED ORDER — FENTANYL CITRATE (PF) 100 MCG/2ML IJ SOLN
INTRAMUSCULAR | Status: DC | PRN
  Administered 2016-08-24 (×4): 50 ug via INTRAVENOUS

## 2016-08-24 SURGICAL SUPPLY — 42 items
BANDAGE ACE 4X5 VEL STRL LF (GAUZE/BANDAGES/DRESSINGS) IMPLANT
BANDAGE ELASTIC 3 VELCRO ST LF (GAUZE/BANDAGES/DRESSINGS) ×3 IMPLANT
BANDAGE ELASTIC 4 VELCRO ST LF (GAUZE/BANDAGES/DRESSINGS) ×2 IMPLANT
BNDG GAUZE ELAST 4 BULKY (GAUZE/BANDAGES/DRESSINGS) ×2 IMPLANT
CORDS BIPOLAR (ELECTRODE) ×3 IMPLANT
COVER SURGICAL LIGHT HANDLE (MISCELLANEOUS) ×3 IMPLANT
CUFF TOURNIQUET SINGLE 18IN (TOURNIQUET CUFF) ×3 IMPLANT
CUFF TOURNIQUET SINGLE 24IN (TOURNIQUET CUFF) IMPLANT
DRAPE SURG 17X23 STRL (DRAPES) IMPLANT
DRAPE U-SHAPE 47X51 STRL (DRAPES) IMPLANT
DURAPREP 26ML APPLICATOR (WOUND CARE) ×3 IMPLANT
GAUZE SPONGE 4X4 12PLY STRL (GAUZE/BANDAGES/DRESSINGS) ×3 IMPLANT
GAUZE SPONGE 4X4 12PLY STRL LF (GAUZE/BANDAGES/DRESSINGS) ×2 IMPLANT
GAUZE XEROFORM 1X8 LF (GAUZE/BANDAGES/DRESSINGS) ×3 IMPLANT
GLOVE BIO SURGEON STRL SZ8 (GLOVE) ×3 IMPLANT
GLOVE BIOGEL PI IND STRL 8 (GLOVE) ×2 IMPLANT
GLOVE BIOGEL PI INDICATOR 8 (GLOVE) ×4
GLOVE ORTHO TXT STRL SZ7.5 (GLOVE) ×3 IMPLANT
GOWN STRL REUS W/ TWL LRG LVL3 (GOWN DISPOSABLE) IMPLANT
GOWN STRL REUS W/ TWL XL LVL3 (GOWN DISPOSABLE) ×2 IMPLANT
GOWN STRL REUS W/TWL LRG LVL3 (GOWN DISPOSABLE) ×3
GOWN STRL REUS W/TWL XL LVL3 (GOWN DISPOSABLE) ×6
KIT BASIN OR (CUSTOM PROCEDURE TRAY) ×3 IMPLANT
KIT ROOM TURNOVER OR (KITS) ×3 IMPLANT
NEEDLE 22X1 1/2 (OR ONLY) (NEEDLE) IMPLANT
NS IRRIG 1000ML POUR BTL (IV SOLUTION) ×3 IMPLANT
PACK ORTHO EXTREMITY (CUSTOM PROCEDURE TRAY) ×3 IMPLANT
PAD ARMBOARD 7.5X6 YLW CONV (MISCELLANEOUS) ×6 IMPLANT
PAD CAST 4YDX4 CTTN HI CHSV (CAST SUPPLIES) ×1 IMPLANT
PADDING CAST ABS 4INX4YD NS (CAST SUPPLIES) ×2
PADDING CAST ABS COTTON 4X4 ST (CAST SUPPLIES) IMPLANT
PADDING CAST COTTON 4X4 STRL (CAST SUPPLIES) ×3
SUCTION FRAZIER HANDLE 10FR (MISCELLANEOUS)
SUCTION TUBE FRAZIER 10FR DISP (MISCELLANEOUS) IMPLANT
SUT ETHILON 3 0 PS 1 (SUTURE) ×5 IMPLANT
SUT ETHILON 4 0 PS 2 18 (SUTURE) IMPLANT
SYR CONTROL 10ML LL (SYRINGE) IMPLANT
TOWEL OR 17X26 10 PK STRL BLUE (TOWEL DISPOSABLE) ×3 IMPLANT
TUBE CONNECTING 12'X1/4 (SUCTIONS)
TUBE CONNECTING 12X1/4 (SUCTIONS) IMPLANT
UNDERPAD 30X30 (UNDERPADS AND DIAPERS) ×3 IMPLANT
WATER STERILE IRR 1000ML POUR (IV SOLUTION) ×3 IMPLANT

## 2016-08-24 NOTE — Progress Notes (Signed)
Patient ready for discharge awaiting ride to arrive at hospital. Discharge instructions reviewed with patient and Cordelia PenSherry at bedside in phase 2. Will discharge patient when his mother arrives.

## 2016-08-24 NOTE — Transfer of Care (Signed)
Immediate Anesthesia Transfer of Care Note  Patient: Spencer Fischer  Procedure(s) Performed: Procedure(s): LEFT OPEN CARPAL TUNNEL RELEASE (Left)  Patient Location: PACU  Anesthesia Type:MAC  Level of Consciousness: awake, alert , oriented and patient cooperative  Airway & Oxygen Therapy: Patient Spontanous Breathing  Post-op Assessment: Report given to RN, Post -op Vital signs reviewed and stable and Patient moving all extremities  Post vital signs: Reviewed and stable  Last Vitals:  Vitals:   08/24/16 0903 08/24/16 1135  BP: (!) 146/86 126/89  Pulse:  74  Resp:  11  Temp:  36.6 C    Last Pain:  Vitals:   08/24/16 0901  TempSrc: Oral      Patients Stated Pain Goal: 1 (54/27/06 2376)  Complications: No apparent anesthesia complications

## 2016-08-24 NOTE — Discharge Instructions (Signed)
Use your left hand as comfort allows. Ice and elevation for swelling. Keep your dressing clean and dry. In 5 days, you can remove your dressings and start getting your incision wet in the shower daily. Place band-aids daily over your incision starting in 5 days.

## 2016-08-24 NOTE — Brief Op Note (Signed)
08/24/2016  11:28 AM  PATIENT:  Anna GenreKevin L Dahir  51 y.o. male  PRE-OPERATIVE DIAGNOSIS:  Left carpal tunnel syndrome  POST-OPERATIVE DIAGNOSIS:  Left carpal tunnel syndrome  PROCEDURE:  Procedure(s): LEFT OPEN CARPAL TUNNEL RELEASE (Left)  SURGEON:  Surgeon(s) and Role:    * Kathryne Hitchhristopher Y Jaeli Grubb, MD - Primary  PHYSICIAN ASSISTANT: Rexene EdisonGil Clark, PA-C  ANESTHESIA:   local and IV sedation  COUNTS:  YES  TOURNIQUET:   Total Tourniquet Time Documented: Forearm (Left) - 12 minutes Total: Forearm (Left) - 12 minutes   DICTATION: .Other Dictation: Dictation Number [id number]  PLAN OF CARE: Discharge to home after PACU  PATIENT DISPOSITION:  PACU - hemodynamically stable.   Delay start of Pharmacological VTE agent (>24hrs) due to surgical blood loss or risk of bleeding: no

## 2016-08-24 NOTE — Op Note (Signed)
NAMLina Sayre:  Dudding, Bruno                  ACCOUNT NO.:  [id number]  MEDICAL RECORD NO.:  [id number]  LOCATION:                               FACILITY:  [facility]  PHYSICIAN:  Vanita PandaChristopher Y. Magnus IvanBlackman, M.D.DATE OF BIRTH:  1965/10/24  DATE OF PROCEDURE:  08/24/2016 DATE OF DISCHARGE:                              OPERATIVE REPORT   PREOPERATIVE DIAGNOSIS:  Left moderate severe carpal tunnel syndrome.  POSTOPERATIVE DIAGNOSIS:  Left moderate severe carpal tunnel syndrome.  PROCEDURE:  Left open carpal tunnel release.  SURGEON:  Vanita PandaChristopher Y. Magnus IvanBlackman, M.D.  ASSISTANT:  Richardean CanalGilbert Clark, PA-C.  ANESTHESIA: 1. Mask ventilation, IV sedation. 2. Local with a mixture of plain lidocaine and plain Sensorcaine.  ANTIBIOTICS:  2 g of IV Ancef.  TOURNIQUET TIME:  Less than 20 minutes.  BLOOD LOSS:  Minimal.  COMPLICATIONS:  None.  INDICATIONS:  Mr. Ellery PlunkBest is a 51 year old gentleman with bilateral carpal tunnel syndrome that is moderate to severe.  This confirmed with clinical exam, his subjective findings, as well as EMG and nerve conduction velocity studies showing the extent of his carpal tunnel syndrome affecting both the motor and sensory components of the median nerve.  At this point, he does wish to proceed with an open carpal tunnel release.  He understands fully the risks of nerve and vessel injury, incomplete release, infection being the main risks.  PROCEDURE DESCRIPTION:  After informed consent was obtained, appropriate left arm was marked.  He was brought to the operating room, placed supine on the operating room table, left arm on arm table.  A nonsterile tourniquet was placed around his upper left arm.  His left hand and wrist were prepped and draped with DuraPrep and sterile drapes.  Time- out was called and he was identified as correct patient and correct left hand and wrist.  With mask ventilation, IV sedation was obtained.  We then placed a local anesthetic into the palmar  incision we made. Adequate anesthesia was obtained.  We then made incision over the transverse carpal ligament.  We found a very wide and thickened transverse carpal ligament.  It was compressing the median nerve quite significantly.  We slowly and meticulously divided the transverse carpal ligament in its entirety and explored the median nerve and found to be intact as well as motor branch.  We then irrigated the incision with normal saline solution.  We closed the incision with interrupted 3-0 nylon suture.  Xeroform and well- padded sterile dressing were applied.  The tourniquet was let down. Fingers pinked nicely.  He was taken to the recovery room in stable condition.  All final counts were correct.  There were no complications noted.     Vanita Pandahristopher Y. Magnus IvanBlackman, M.D.   ______________________________ Vanita Pandahristopher Y. Magnus IvanBlackman, M.D.    CYB/MEDQ  D:  08/24/2016  T:  08/24/2016  Job:  [id number]

## 2016-08-24 NOTE — Anesthesia Preprocedure Evaluation (Addendum)
Anesthesia Evaluation  Patient identified by MRN, date of birth, ID band Patient awake    Reviewed: Allergy & Precautions, H&P , NPO status   Airway Mallampati: I  TM Distance: >3 FB Neck ROM: Full    Dental   Pulmonary sleep apnea , Current Smoker,    Pulmonary exam normal        Cardiovascular hypertension, Pt. on medications Normal cardiovascular exam     Neuro/Psych    GI/Hepatic GERD  Controlled and Medicated,  Endo/Other    Renal/GU      Musculoskeletal   Abdominal   Peds  Hematology   Anesthesia Other Findings   Reproductive/Obstetrics                            Anesthesia Physical Anesthesia Plan  ASA: III  Anesthesia Plan: MAC   Post-op Pain Management:    Induction: Intravenous  Airway Management Planned:   Additional Equipment:   Intra-op Plan:   Post-operative Plan:   Informed Consent: I have reviewed the patients History and Physical, chart, labs and discussed the procedure including the risks, benefits and alternatives for the proposed anesthesia with the patient or authorized representative who has indicated his/her understanding and acceptance.     Plan Discussed with: Surgeon and CRNA  Anesthesia Plan Comments:        Anesthesia Quick Evaluation

## 2016-08-24 NOTE — Anesthesia Postprocedure Evaluation (Signed)
Anesthesia Post Note  Patient: Spencer Fischer  Procedure(s) Performed: Procedure(s) (LRB): LEFT OPEN CARPAL TUNNEL RELEASE (Left)  Patient location during evaluation: PACU Anesthesia Type: MAC Level of consciousness: awake and alert Pain management: pain level controlled Vital Signs Assessment: post-procedure vital signs reviewed and stable Respiratory status: spontaneous breathing, nonlabored ventilation, respiratory function stable and patient connected to nasal cannula oxygen Cardiovascular status: stable and blood pressure returned to baseline Anesthetic complications: no       Last Vitals:  Vitals:   08/24/16 1145 08/24/16 1202  BP: 118/71 107/79  Pulse: 63 (!) 57  Resp: 15 13  Temp:  36.7 C    Last Pain:  Vitals:   08/24/16 1202  TempSrc:   PainSc: 0-No pain                 Shawnta Schlegel,Juanito DAVID

## 2016-08-24 NOTE — H&P (Signed)
Spencer Fischer is an 51 y.o. male.   Chief Complaint:  Left hand numbness and weakness HPI:   51 yo male with left upper extremity moderate to severe carpal tunnel syndrome confirmed by clinical exam and EMG/nerve conduction studies.  Past Medical History:  Diagnosis Date  . Anxiety   . Arthritis   . Carpal tunnel syndrome   . Carpal tunnel syndrome, left   . Depression   . Hypertension   . Sleep apnea    wears CPAP    Past Surgical History:  Procedure Laterality Date  . BILATERAL ANTERIOR TOTAL HIP ARTHROPLASTY Bilateral 04/06/2015   Procedure: BILATERAL ANTERIOR TOTAL HIP ARTHROPLASTY;  Surgeon: Mcarthur Rossetti, MD;  Location: [facility];  Service: Orthopedics;  Laterality: Bilateral;  . KNEE SURGERY Left   . WISDOM TOOTH EXTRACTION      Family History  Problem Relation Age of Onset  . Hypertension Mother   . Hypertension Sister   . Hypertension Brother    Social History:  reports that he has been smoking Cigarettes.  He has been smoking about 0.25 packs per day. He has never used smokeless tobacco. He reports that he drinks alcohol. He reports that he does not use drugs.  Allergies:  Allergies  Allergen Reactions  . No Known Allergies     Medications Prior to Admission  Medication Sig Dispense Refill  . citalopram (CELEXA) 20 MG tablet Take 20 mg by mouth daily.  1  . gabapentin (NEURONTIN) 300 MG capsule Start with 1 tab po qhs X 1 week, then increase to 1 tab po bid X 1 week then 1 tab po tid prn (Patient taking differently: Take 300 mg by mouth 2 (two) times daily as needed (pain). ) 90 capsule 1  . methocarbamol (ROBAXIN) 500 MG tablet Take 1 tablet (500 mg total) by mouth 2 (two) times daily. (Patient taking differently: Take 500 mg by mouth 2 (two) times daily as needed for muscle spasms. ) 60 tablet 0  . Oxycodone HCl 10 MG TABS Take 1 tablet by mouth 3 (three) times daily as needed (pain).   0  . triamterene-hydrochlorothiazide (MAXZIDE-25) 37.5-25 MG tablet  Take 1 tablet by mouth daily. 30 tablet 3  . tiZANidine (ZANAFLEX) 4 MG tablet Take 1 tablet (4 mg total) by mouth every 8 (eight) hours as needed for muscle spasms. (Patient not taking: Reported on 08/22/2016) 30 tablet 0    Results for orders placed or performed during the hospital encounter of 08/24/16 (from the past 48 hour(s))  CBC     Status: None   Collection Time: 08/24/16  9:12 AM  Result Value Ref Range   WBC 7.5 4.0 - 10.5 K/uL   RBC 4.86 4.22 - 5.81 MIL/uL   Hemoglobin 15.3 13.0 - 17.0 g/dL   HCT 44.2 39.0 - 52.0 %   MCV 90.9 78.0 - 100.0 fL   MCH 31.5 26.0 - 34.0 pg   MCHC 34.6 30.0 - 36.0 g/dL   RDW 14.8 11.5 - 15.5 %   Platelets 211 150 - 400 K/uL  Basic metabolic panel     Status: Abnormal   Collection Time: 08/24/16  9:12 AM  Result Value Ref Range   Sodium 136 135 - 145 mmol/L   Potassium 4.3 3.5 - 5.1 mmol/L   Chloride 105 101 - 111 mmol/L   CO2 21 (L) 22 - 32 mmol/L   Glucose, Bld 101 (H) 65 - 99 mg/dL   BUN 10 6 - 20  mg/dL   Creatinine, Ser 0.94 0.61 - 1.24 mg/dL   Calcium 9.2 8.9 - 10.3 mg/dL   GFR calc non Af Amer >60 >60 mL/min   GFR calc Af Amer >60 >60 mL/min    Comment: (NOTE) The eGFR has been calculated using the CKD EPI equation. This calculation has not been validated in all clinical situations. eGFR's persistently <60 mL/min signify possible Chronic Kidney Disease.    Anion gap 10 5 - 15   No results found.  Review of Systems  All other systems reviewed and are negative.   Blood pressure (!) 146/86, pulse 72, temperature 98.3 F (36.8 C), temperature source Oral, resp. rate 20, weight 228 lb (103.4 kg), SpO2 99 %. Physical Exam  Constitutional: He is oriented to person, place, and time. He appears well-developed and well-nourished.  HENT:  Head: Normocephalic and atraumatic.  Eyes: EOM are normal. Pupils are equal, round, and reactive to light.  Neck: Normal range of motion. Neck supple.  Cardiovascular: Normal rate and regular  rhythm.   Respiratory: Effort normal and breath sounds normal.  GI: Soft. Bowel sounds are normal.  Musculoskeletal:       Left hand: Decreased sensation noted. Decreased sensation is present in the medial distribution. Decreased strength noted.  Neurological: He is alert and oriented to person, place, and time.  Skin: Skin is warm and dry.  Psychiatric: He has a normal mood and affect.     Assessment/Plan Moderate to severe carpal tunnel syndrome left upper extremity 1)  To the OR today as an outpatient for an open left carpal tunnel release.  Risks and benefits have been discussed in detail and informed consent is obtained.  Mcarthur Rossetti, MD 08/24/2016, 10:08 AM

## 2016-08-25 ENCOUNTER — Encounter (HOSPITAL_COMMUNITY): Payer: Self-pay | Admitting: Orthopaedic Surgery

## 2016-08-29 ENCOUNTER — Inpatient Hospital Stay: Admission: RE | Admit: 2016-08-29 | Payer: Medicaid Other | Source: Ambulatory Visit

## 2016-08-30 ENCOUNTER — Ambulatory Visit: Payer: Medicaid Other | Admitting: Pulmonary Disease

## 2016-08-31 ENCOUNTER — Inpatient Hospital Stay (INDEPENDENT_AMBULATORY_CARE_PROVIDER_SITE_OTHER): Payer: Medicaid Other | Admitting: Orthopaedic Surgery

## 2016-09-06 ENCOUNTER — Ambulatory Visit (INDEPENDENT_AMBULATORY_CARE_PROVIDER_SITE_OTHER): Payer: Medicaid Other | Admitting: Orthopaedic Surgery

## 2016-09-06 DIAGNOSIS — G5602 Carpal tunnel syndrome, left upper limb: Secondary | ICD-10-CM

## 2016-09-06 MED ORDER — HYDROCODONE-ACETAMINOPHEN 5-325 MG PO TABS: 1.0000 | ORAL_TABLET | Freq: Three times a day (TID) | ORAL | 0 refills | Status: DC | PRN

## 2016-09-06 NOTE — Progress Notes (Signed)
The patient is following up 2 weeks after a left open carpal tunnel release to treat his severe carpal tunnel syndrome. He is doing well.  On examination his incision is healed nicely. The sutures have been removed and Steri-Strips applied. He reports good function of his hand. He can move his thumb and fingers. He does have some subjective decreased sensation still but he should given the extent of the severity of his nerve compression from his median nerve being compressed. He still has carpal tunnel syndrome on the right side. We'll dress this at a later date.  I'll see him back in a month to see how is doing overall. He'll slowly increase his activities a comfort allows. I did refill his hydrocodone.

## 2016-09-07 ENCOUNTER — Inpatient Hospital Stay (INDEPENDENT_AMBULATORY_CARE_PROVIDER_SITE_OTHER): Payer: Medicaid Other | Admitting: Orthopaedic Surgery

## 2016-09-11 ENCOUNTER — Ambulatory Visit: Payer: Medicaid Other | Admitting: Pulmonary Disease

## 2016-09-13 ENCOUNTER — Encounter: Payer: Self-pay | Admitting: Pulmonary Disease

## 2016-09-14 ENCOUNTER — Encounter: Payer: Self-pay | Admitting: Pulmonary Disease

## 2016-09-14 ENCOUNTER — Ambulatory Visit (INDEPENDENT_AMBULATORY_CARE_PROVIDER_SITE_OTHER): Payer: Medicare Other | Admitting: Pulmonary Disease

## 2016-09-14 DIAGNOSIS — F17200 Nicotine dependence, unspecified, uncomplicated: Secondary | ICD-10-CM | POA: Diagnosis not present

## 2016-09-14 DIAGNOSIS — G4733 Obstructive sleep apnea (adult) (pediatric): Secondary | ICD-10-CM | POA: Diagnosis not present

## 2016-09-14 NOTE — Assessment & Plan Note (Addendum)
Patient is single. He endorses snoring, witnessed apneas, gasping, choking, unrefreshed sleep, frequent awakenings. He wakes up in the morning feeling unrefreshed. He ends up napping couple times during the day. He endorses sleep talking. Denies any other abnormal behavior.  Patient had a lab sleep study on 11/17/2015, AHI was 8.5. ESS 18. He takes Oxycodone 20 mg QID 2/2 recent CTS.  He was taking oxycodone 3-4x/day prior to surgery.   He was on auto CPAP 5-15 centimeters water but we had to cut down the pressure to 5-10 centimeters water as he felt the pressure was too much. He states whenever he uses his CPAP, he is significantly improved, more energy, less sleepiness. He feels benefit of CPAP use. Unfortunately, he is having issues with his electrical bills. He is working on it. He has to go to his mother's house to try to use the CPAP. He also has been in touch with [place] [employer] regarding compliance issues as well as supplies. Download the last month: 40%, AHI 26.3. Pt has  leak issues per DL. He has nasal pillows and he denies any issues. Not sure if he's having centrals now that he is on higher opiates.   Plan: We extensively discussed the importance of treating OSA and the need to use PAP therapy.   Continue with autocpap 5-10 cm water for now. I suspect he will need a repeat sleep study as he has not been compliant per Medicaid rules. He is okay with repeating a lab study. During that study, we will also determine whether he is having central apneas. We should also be able to determine what the optimal pressure for him will be. It may be that he will need BiPAP. We will touch base with [place] DME company and determine whether he needs a repeat study or not. If not, we can probably do a PAP NAP with [facility] [place] to determine optimal pressure as well as Cupples mask fit.   Patient was instructed to have mask, tubings, filter, reservoir cleaned at least once a week with soapy water.  Patient was instructed to  call the office if he/she is having issues with the PAP device.    I advised patient to obtain sufficient amount of sleep --  7 to 8 hours at least in a 24 hr period.  Patient was advised to follow good sleep hygiene.  Patient was advised NOT to engage in activities requiring concentration and/or vigilance if he/she is and  sleepy.  Patient is NOT to drive if he/she is sleepy.

## 2016-09-14 NOTE — Assessment & Plan Note (Signed)
Smoking cessation  

## 2016-09-14 NOTE — Patient Instructions (Signed)
  It was a pleasure taking care of you today!  Continue using your CPAP machine. We will try to get hold of your DME to determine regarding your cpap status (I.e. Whether you need a repeat sleep study).   Please make sure you use your CPAP device everytime you sleep.  We will monitor the usage of your machine per your insurance requirement.  Your insurance company may take the machine from you if you are not using it regularly.   Please clean the mask, tubings, filter, water reservoir with soapy water every week.  Please use distilled water for the water reservoir.   Please call the office or your machine provider (DME company) if you are having issues with the device.   Return to clinic in 6 months with NP

## 2016-09-14 NOTE — Progress Notes (Signed)
Subjective:    Patient ID: Spencer Fischer, male    DOB: 11-05-1965, 51 y.o.   MRN: [id number]  HPI   This is the case of Spencer Fischer, 51 y.o. Male, who was referred by Dr. Dessa Phi in consultation regarding OSA.    As you very well know, patient smokes 1/4 PPD x 35 yrs, denies any lung issues, is here for obstructive sleep apnea.  Patient is single. He endorses snoring, witnessed apneas, gasping, choking, unrefreshed sleep, frequent awakenings. He wakes up in the morning feeling unrefreshed. He ends up napping couple times during the day. He endorses sleep talking. Denies any other abnormal behavior.  Patient had a lab sleep study on 11/17/2015, AHI was 8.5.  Patient has chronic hip pain issues. He had surgery of both his hips in October 2016.  He has been on Oxycodone 5 mg TID x 3-4 mos. it is unlikely that he'll be off oxycodone.  ROV 09/14/2016  Patient returns to the office as follow-up on his sleep apnea. Since last seen, he has had issues with his electricity, not able to pay off his bills so he ends up going to his mother's house and using his CPAP when he can. When he uses his CPAP, he feels better using it, more energy, less sleepiness. There are times he is unable to use it since he does not have electricity. He has nasal mask, no issues with it. He has chronic pain issues. He had recent carpal tunnel surgery. He takes oxycodone, 20 mg, 4 times a day. Download the last month: 40%, AHI 26.3. He has a lot of leak issues.  Review of Systems  Constitutional: Negative.  Negative for fever and unexpected weight change.  HENT: Negative for congestion, dental problem, ear pain, nosebleeds, postnasal drip, rhinorrhea, sinus pressure, sneezing, sore throat and trouble swallowing.   Eyes: Negative for discharge, redness and itching.  Respiratory: Positive for shortness of breath. Negative for cough, chest tightness and wheezing.   Cardiovascular: Negative.  Negative for palpitations and  leg swelling.  Gastrointestinal: Negative.  Negative for nausea and vomiting.  Endocrine: Negative.   Genitourinary: Negative.  Negative for dysuria.  Musculoskeletal: Positive for arthralgias, back pain and myalgias. Negative for joint swelling.  Skin: Negative.  Negative for rash.  Allergic/Immunologic: Negative for environmental allergies.  Neurological: Positive for headaches.  Hematological: Negative.  Does not bruise/bleed easily.  Psychiatric/Behavioral: Negative.  Negative for dysphoric mood. The patient is not nervous/anxious.       Objective:   Physical Exam  Vitals:  Vitals:   09/14/16 1337  BP: 112/84  Pulse: 82  SpO2: 98%  Weight: 232 lb 9.6 oz (105.5 kg)  Height:  (1.803 m)    Constitutional/General:  Pleasant, well-nourished, well-developed, not in any distress,  Comfortably seating.  Well kempt  Body mass index is 32.44 kg/m. Wt Readings from Last 3 Encounters:  09/14/16 232 lb 9.6 oz (105.5 kg)  08/24/16 228 lb (103.4 kg)  06/06/16 228 lb 12.8 oz (103.8 kg)    Neck circumference: 18 inches  HEENT: Pupils equal and reactive to light and accommodation. Anicteric sclerae. Normal nasal mucosa.   No oral  lesions,  mouth clear,  oropharynx clear, no postnasal drip. (-) Oral thrush. No dental caries.  Airway - Mallampati class IV  Neck: No masses. Midline trachea. No JVD, (-) LAD. (-) bruits appreciated.  Respiratory/Chest: Grossly normal chest. (-) deformity. (-) Accessory muscle use.  Symmetric expansion. (-) Tenderness on palpation.  Resonant on percussion.  Diminished BS on both lower lung zones. (-) wheezing, crackles, rhonchi (-) egophony  Cardiovascular: Regular rate and  rhythm, heart sounds normal, no murmur or gallops, no peripheral edema  Gastrointestinal:  Normal bowel sounds. Soft, non-tender. No hepatosplenomegaly.  (-) masses.   Musculoskeletal:  Normal muscle tone. Normal gait.   Extremities: Grossly normal. (-) clubbing,  cyanosis.  (-) edema  Skin: (-) rash,lesions seen.   Neurological/Psychiatric : alert, oriented to time, place, person. Normal mood and affect         Assessment & Plan:  OSA (obstructive sleep apnea) Patient is single. He endorses snoring, witnessed apneas, gasping, choking, unrefreshed sleep, frequent awakenings. He wakes up in the morning feeling unrefreshed. He ends up napping couple times during the day. He endorses sleep talking. Denies any other abnormal behavior.  Patient had a lab sleep study on 11/17/2015, AHI was 8.5. ESS 18. He takes Oxycodone 20 mg QID 2/2 recent CTS.  He was taking oxycodone 3-4x/day prior to surgery.   He was on auto CPAP 5-15 centimeters water but we had to cut down the pressure to 5-10 centimeters water as he felt the pressure was too much. He states whenever he uses his CPAP, he is significantly improved, more energy, less sleepiness. He feels benefit of CPAP use. Unfortunately, he is having issues with his electrical bills. He is working on it. He has to go to his mother's house to try to use the CPAP. He also has been in touch with [place] DME regarding compliance issues as well as supplies. Download the last month: 40%, AHI 26.3. Pt has  leak issues per DL. He has nasal pillows and he denies any issues. Not sure if he's having centrals now that he is on higher opiates.   Plan: We extensively discussed the importance of treating OSA and the need to use PAP therapy.   Continue with autocpap 5-10 cm water for now. I suspect he will need a repeat sleep study as he has not been compliant per Medicaid rules. He is okay with repeating a lab study. During that study, we will also determine whether he is having central apneas. We should also be able to determine what the optimal pressure for him will be. It may be that he will need BiPAP. We will touch base with [place] DME company and determine whether he needs a repeat study or not. If not, we can probably do a PAP  NAP with [facility] [place] to determine optimal pressure as well as [place] mask fit.   Patient was instructed to have mask, tubings, filter, reservoir cleaned at least once a week with soapy water.  Patient was instructed to call the office if he/she is having issues with the PAP device.    I advised patient to obtain sufficient amount of sleep --  7 to 8 hours at least in a 24 hr period.  Patient was advised to follow Fischer sleep hygiene.  Patient was advised NOT to engage in activities requiring concentration and/or vigilance if he/she is and  sleepy.  Patient is NOT to drive if he/she is sleepy.   Current smoker Smoking cessation  Patient will follow up in 6 months.    Pollie Meyer, MD 09/14/2016   2:09 PM Pulmonary and Critical Care Medicine [facility] Pager: [phone] Office: [phone], Fax: [phone]

## 2016-09-15 NOTE — Addendum Note (Signed)
Addended by: Garfield Cornea L on: 09/15/2016 11:04 AM   Modules accepted: Orders

## 2016-09-18 ENCOUNTER — Other Ambulatory Visit: Payer: Medicaid Other

## 2016-09-19 ENCOUNTER — Telehealth: Payer: Self-pay | Admitting: Family Medicine

## 2016-09-19 NOTE — Telephone Encounter (Signed)
Patient called the office asking to speak with nurse regarding scheduling an appt for sleep study. Pt hasn't received any calls. Please follow up.  Thank you.

## 2016-09-20 NOTE — Telephone Encounter (Signed)
Pt was called to be informed of his sleep study appointment on 11/08/16 At 8:00 pm. Pt did not answer phone and no VM is set up to leave a message.

## 2016-10-04 ENCOUNTER — Ambulatory Visit (INDEPENDENT_AMBULATORY_CARE_PROVIDER_SITE_OTHER): Payer: Medicaid Other | Admitting: Orthopaedic Surgery

## 2016-10-23 ENCOUNTER — Ambulatory Visit (INDEPENDENT_AMBULATORY_CARE_PROVIDER_SITE_OTHER): Payer: Medicaid Other | Admitting: Orthopaedic Surgery

## 2016-10-25 ENCOUNTER — Encounter: Payer: Self-pay | Admitting: Family Medicine

## 2016-10-25 ENCOUNTER — Ambulatory Visit (INDEPENDENT_AMBULATORY_CARE_PROVIDER_SITE_OTHER): Payer: Medicaid Other | Admitting: Orthopaedic Surgery

## 2016-11-08 ENCOUNTER — Ambulatory Visit (HOSPITAL_BASED_OUTPATIENT_CLINIC_OR_DEPARTMENT_OTHER): Payer: Medicaid Other | Attending: Pulmonary Disease

## 2017-01-03 ENCOUNTER — Emergency Department (HOSPITAL_COMMUNITY)
Admission: EM | Admit: 2017-01-03 | Discharge: 2017-01-03 | Disposition: A | Discharge status: Home / Self Care | Payer: Medicare Other | Attending: Emergency Medicine | Admitting: Emergency Medicine

## 2017-01-03 ENCOUNTER — Encounter (HOSPITAL_COMMUNITY): Payer: Self-pay

## 2017-01-03 ENCOUNTER — Emergency Department (HOSPITAL_COMMUNITY): Payer: Medicare Other

## 2017-01-03 DIAGNOSIS — R0602 Shortness of breath: Secondary | ICD-10-CM | POA: Diagnosis not present

## 2017-01-03 DIAGNOSIS — Z79899 Other long term (current) drug therapy: Secondary | ICD-10-CM | POA: Diagnosis not present

## 2017-01-03 DIAGNOSIS — I10 Essential (primary) hypertension: Secondary | ICD-10-CM | POA: Insufficient documentation

## 2017-01-03 DIAGNOSIS — Z760 Encounter for issue of repeat prescription: Secondary | ICD-10-CM | POA: Insufficient documentation

## 2017-01-03 DIAGNOSIS — F17210 Nicotine dependence, cigarettes, uncomplicated: Secondary | ICD-10-CM | POA: Insufficient documentation

## 2017-01-03 DIAGNOSIS — R51 Headache: Secondary | ICD-10-CM | POA: Diagnosis present

## 2017-01-03 DIAGNOSIS — F419 Anxiety disorder, unspecified: Secondary | ICD-10-CM | POA: Insufficient documentation

## 2017-01-03 DIAGNOSIS — G44209 Tension-type headache, unspecified, not intractable: Secondary | ICD-10-CM | POA: Diagnosis not present

## 2017-01-03 LAB — CBC
HEMATOCRIT: 47.2 % (ref 39.0–52.0)
Hemoglobin: 16.6 g/dL (ref 13.0–17.0)
MCH: 31.2 pg (ref 26.0–34.0)
MCHC: 35.2 g/dL (ref 30.0–36.0)
MCV: 88.7 fL (ref 78.0–100.0)
Platelets: 245 10*3/uL (ref 150–400)
RBC: 5.32 MIL/uL (ref 4.22–5.81)
RDW: 14.4 % (ref 11.5–15.5)
WBC: 8.9 10*3/uL (ref 4.0–10.5)

## 2017-01-03 LAB — BASIC METABOLIC PANEL
Anion gap: 9 (ref 5–15)
BUN: 16 mg/dL (ref 6–20)
CHLORIDE: 104 mmol/L (ref 101–111)
CO2: 20 mmol/L — ABNORMAL LOW (ref 22–32)
Calcium: 9.5 mg/dL (ref 8.9–10.3)
Creatinine, Ser: 1.3 mg/dL — ABNORMAL HIGH (ref 0.61–1.24)
GFR calc non Af Amer: >60 mL/min (ref >60)
Glucose, Bld: 117 mg/dL — ABNORMAL HIGH (ref 65–99)
POTASSIUM: 4.2 mmol/L (ref 3.5–5.1)
SODIUM: 133 mmol/L — AB (ref 135–145)

## 2017-01-03 LAB — I-STAT TROPONIN, ED: Troponin i, poc: 0 ng/mL (ref 0.00–0.08)

## 2017-01-03 LAB — SEDIMENTATION RATE: SED RATE: 1 mm/h (ref 0–16)

## 2017-01-03 MED ORDER — METHOCARBAMOL 500 MG PO TABS: 500.0000 mg | ORAL_TABLET | Freq: Two times a day (BID) | ORAL | 0 refills | Status: DC

## 2017-01-03 MED ORDER — CITALOPRAM HYDROBROMIDE 20 MG PO TABS: 20.0000 mg | ORAL_TABLET | Freq: Every day | ORAL | 0 refills | Status: DC

## 2017-01-03 MED ORDER — TETRACAINE HCL 0.5 % OP SOLN
2.0000 [drp] | Freq: Once | OPHTHALMIC | Status: AC
  Administered 2017-01-03: 2 [drp] via OPHTHALMIC
  Filled 2017-01-03: qty 4

## 2017-01-03 MED ORDER — GABAPENTIN 100 MG PO CAPS: 300.0000 mg | ORAL_CAPSULE | Freq: Two times a day (BID) | ORAL | 0 refills | Status: DC

## 2017-01-03 NOTE — ED Provider Notes (Signed)
[facility]-EMERGENCY DEPT Provider Note   CSN: [id number] Arrival date & time: 01/03/17  1315     History   Chief Complaint Chief Complaint  Patient presents with  . headache/SOB    HPI Spencer Fischer is a 51 y.o. male.  HPI Patient reports he has noted a right-sided headache for 1 day. He reports he can feel a swollen area on his right temple that is tender.He reports he had no injuries whatsoever. He feels as if his right eye has pressure behind it and if he rules out upward to put some pressure on that something is pushing out on it. He has not specifically had visual change however he does note that the headache sometimes concentrates behind his eye. Patient does note that he has been under a severe amount of stress recently. No associated fever or neck stiffness. No associated focal weakness numbness tingling of extremities. No gait and coordination. No nausea no vomiting. Patient reports the shortness of breath as noted in triage is probably because having the pain on the side of his head and noticing the swollen area, made him extremely anxious and breathing harder and faster. Past Medical History:  Diagnosis Date  . Anxiety   . Arthritis   . Carpal tunnel syndrome   . Carpal tunnel syndrome, left   . Depression   . Hypertension   . Sleep apnea    wears CPAP    Patient Active Problem List   Diagnosis Date Noted  . Carpal tunnel syndrome, left upper limb 08/09/2016  . Obesity 06/06/2016  . OSA (obstructive sleep apnea) 02/17/2016  . Obesity (BMI 30.0-34.9) 12/02/2015  . Mild obstructive sleep apnea 10/01/2015  . HTN (hypertension) 10/01/2015  . Current smoker 10/01/2015  . Left leg claudication (HCC) 10/01/2015  . Elevated WBC count 10/01/2015  . Chronic low back pain without sciatica 10/01/2015  . Carpal tunnel syndrome 10/01/2015  . Hypokalemia 04/14/2015  . GERD (gastroesophageal reflux disease) 04/13/2015  . 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    Past  Surgical History:  Procedure Laterality Date  . BILATERAL ANTERIOR TOTAL HIP ARTHROPLASTY Bilateral 04/06/2015   Procedure: BILATERAL ANTERIOR TOTAL HIP ARTHROPLASTY;  Surgeon: Kathryne Hitchhristopher Y Blackman, MD;  Location: [facility] OR;  Service: Orthopedics;  Laterality: Bilateral;  . CARPAL TUNNEL RELEASE Left 08/24/2016   Procedure: LEFT OPEN CARPAL TUNNEL RELEASE;  Surgeon: Kathryne Hitchhristopher Y Blackman, MD;  [facility];  Service: Orthopedics;  Laterality: Left;  . KNEE SURGERY Left   . WISDOM TOOTH EXTRACTION         Home Medications    Prior to Admission medications   Medication Sig Start Date End Date Taking? Authorizing Provider  aspirin EC 325 MG tablet Take 325 mg by mouth daily.   Yes [provider]  citalopram (CELEXA) 20 MG tablet Take 20 mg by mouth daily. 08/02/16  Yes [provider]  gabapentin (NEURONTIN) 300 MG capsule Start with 1 tab po qhs X 1 week, then increase to 1 tab po bid X 1 week then 1 tab po tid prn Patient taking differently: Take 300 mg by mouth 2 (two) times daily as needed (for pain).  05/31/16  Yes Andrena Mewsigby, Michael D, DO  naproxen (NAPROSYN) 375 MG tablet Take 1 tablet by mouth 2 (two) times daily. ALTERNATING WITH OXYCODONE 12/12/16  Yes [provider]  Oxycodone HCl 10 MG TABS Take 1 tablet (10 mg total) by mouth 4 (four) times daily as needed (pain). 08/24/16  Yes Doneen PoissonBlackman, Christopher  Y, MD  triamterene-hydrochlorothiazide (MAXZIDE-25) 37.5-25 MG tablet Take 1 tablet by mouth daily. 10/01/15  Yes Funches, Josalyn, MD  HYDROcodone-acetaminophen (NORCO/VICODIN) 5-325 MG tablet Take 1-2 tablets by mouth 3 (three) times daily as needed for moderate pain. Patient not taking: Reported on 01/03/2017 09/06/16   Kathryne Hitch, MD  methocarbamol (ROBAXIN) 500 MG tablet Take 1 tablet (500 mg total) by mouth 2 (two) times daily. Patient not taking: Reported on 01/03/2017 07/06/16   Kathryne Hitch, MD    Family History Family History    Problem Relation Age of Onset  . Hypertension Mother   . Hypertension Sister   . Hypertension Brother     Social History Social History  Substance Use Topics  . Smoking status: Current Every Day Smoker    Packs/day: 0.25    Types: Cigarettes  . Smokeless tobacco: Never Used  . Alcohol use Yes     Comment: 4- 40 ounces of beer/week     Allergies   Patient has no known allergies.   Review of Systems Review of Systems  10 Systems reviewed and are negative for acute change except as noted in the HPI.  Physical Exam Updated Vital Signs BP (!) 137/101   Pulse 80   Temp 97.8 F (36.6 C) (Oral)   Resp 15   SpO2 95%   Physical Exam  Constitutional: He is oriented to person, place, and time. He appears well-developed and well-nourished.  HENT:  Head: Normocephalic and atraumatic.  Right Ear: External ear normal.  Left Ear: External ear normal.  Nose: Nose normal.  Mouth/Throat: Oropharynx is clear and moist.  Patient does have prominence of the temporalis on the right. This is somewhat asymmetric as opposed to temporalis muscle on the left. He endorses tenderness to palpation over it. I cannot specifically appreciate an enlarged temporal artery. There is no erythema, skin changes or any type of lesion associated with this. Face is otherwise symmetric with no areas of swelling  Eyes: Pupils are equal, round, and reactive to light. Conjunctivae and EOM are normal.  Normal consensual pupillary responses. Normal extraocular motions. No significant injection of the right compared to the left of the sclera.  Tono-Pen intraocular pressure  Right: 24:16:17 Left: 17:16:17  Neck: Neck supple.  Cardiovascular: Normal rate, regular rhythm, normal heart sounds and intact distal pulses.   No murmur heard. Pulmonary/Chest: Effort normal and breath sounds normal. No respiratory distress.  Abdominal: Soft. There is no tenderness.  Musculoskeletal: Normal range of motion. He exhibits  no edema or tenderness.  Neurological: He is alert and oriented to person, place, and time. No cranial nerve deficit. He exhibits normal muscle tone. Coordination normal.  Skin: Skin is warm and dry.  Psychiatric: He has a normal mood and affect.  Nursing note and vitals reviewed.    ED Treatments / Results  Labs (all labs ordered are listed, but only abnormal results are displayed) Labs Reviewed  BASIC METABOLIC PANEL - Abnormal; Notable for the following:       Result Value   Sodium 133 (*)    CO2 20 (*)    Glucose, Bld 117 (*)    Creatinine, Ser 1.30 (*)    All other components within normal limits  CBC  SEDIMENTATION RATE  I-STAT TROPONIN, ED    EKG  EKG Interpretation  Date/Time:  Wednesday January 03 2017 13:16:38 EDT Ventricular Rate:  82 PR Interval:  138 QRS Duration: 94 QT Interval:  362 QTC Calculation: 422 R Axis:  77 Text Interpretation:  Normal sinus rhythm Normal ECG no sig change from previous Confirmed by Arby BarrettePfeiffer, Kylii Ennis [phone]([id number]) on 01/03/2017 4:27:33 PM       Radiology Dg Chest 2 View  Result Date: 01/03/2017 CLINICAL DATA:  Shortness of breath, right-sided facial pain. EXAM: CHEST  2 VIEW COMPARISON:  None. FINDINGS: The heart size and mediastinal contours are within normal limits. Both lungs are clear. The visualized skeletal structures are unremarkable. IMPRESSION: No active cardiopulmonary disease. Electronically Signed   By: Elige KoHetal  Patel   On: 01/03/2017 13:56    Procedures Procedures (including critical care time)  Medications Ordered in ED Medications  tetracaine (PONTOCAINE) 0.5 % ophthalmic solution 2 drop (not administered)     Initial Impression / Assessment and Plan / ED Course  I have reviewed the triage vital signs and the nursing notes.  Pertinent labs & imaging results that were available during my care of the patient were reviewed by me and considered in my medical decision making (see chart for details).    Patient reports  that he needs a refill on his anxiety and psychiatric medications.  Final Clinical Impressions(s) / ED Diagnoses   Final diagnoses:  Acute non intractable tension-type headache  Anxiety  Medication refill  Patient is alert and nontoxic. His examination is normal except mild prominence to the right temporalis muscle. Sedimentation rate is 1, Tono-Pen ocular exam is normal. Patient reports significant amount of recent stress. Consideration for bruxism and temporalis muscle inflammation and or tension headache. Patient is very clinically well, despite reported significant stress and anxiety, his mood is elevated and he is pleasantly interactive and jovial. Patient requests refill of his psychiatric and anxiety medications. He reports he has not taken his antihypertensive today yet. He reports he does have a PCP in the psychiatric provider.  New Prescriptions New Prescriptions   No medications on file     Arby BarrettePfeiffer, Cyan Moultrie, MD 01/03/17 [phone]

## 2017-01-03 NOTE — ED Triage Notes (Signed)
Patient complains of right sided head pain with facial pain x 1 day. States that he feels like his brain is swelling, SOB with same.  On arrival alert and oriented, denies trauma.

## 2017-01-03 NOTE — ED Notes (Signed)
Pt states the rt side  Of head was swollen and he felt weak in his rt arm since last night, pt states   Took some of meds ? unk kind  But discomfort did not go away so e is here in ER

## 2017-01-16 MED ORDER — IOPAMIDOL (ISOVUE-300) INJECTION 61%
INTRAVENOUS | Status: AC
  Filled 2017-01-16: qty 100

## 2017-08-01 ENCOUNTER — Ambulatory Visit (INDEPENDENT_AMBULATORY_CARE_PROVIDER_SITE_OTHER): Payer: Self-pay | Admitting: Orthopaedic Surgery

## 2017-09-06 IMAGING — MR MR CERVICAL SPINE W/O CM
5 series · 45 of 48 positions shown · non-contrast
Comparison: Cervical spine radiographs September 16, 2014

CLINICAL DATA: Worsening shoulder pain and arm numbness/weakness.
Evaluate LEFT greater than RIGHT C5-6 radiculopathy.

EXAM:
MRI CERVICAL SPINE WITHOUT CONTRAST
TECHNIQUE: Multiplanar, multisequence MR imaging of the cervical spine was
performed. No intravenous contrast was administered.

[Series 9: T2 · sagittal · 3.0mm · 0.55mm/px · 6 of 15 slices shown (1 of 2)]
[im 1/15]
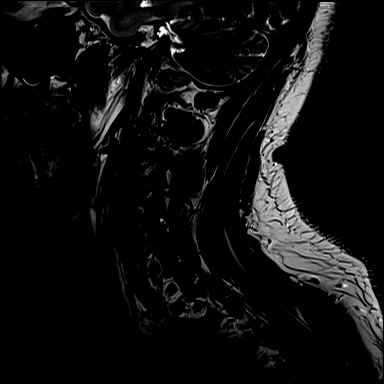
[im 3/15]
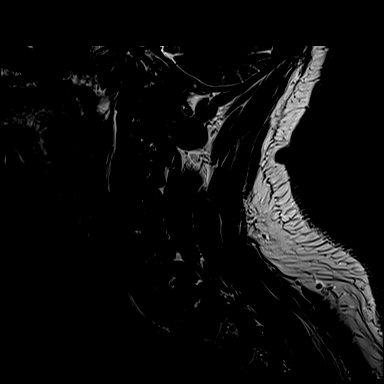
[im 6/15]
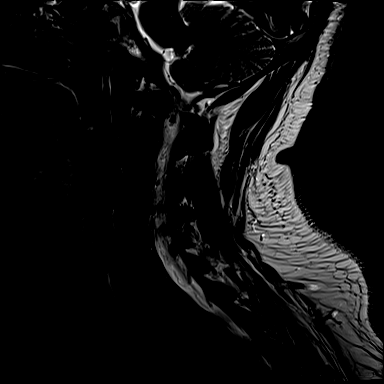
[im 9/15]
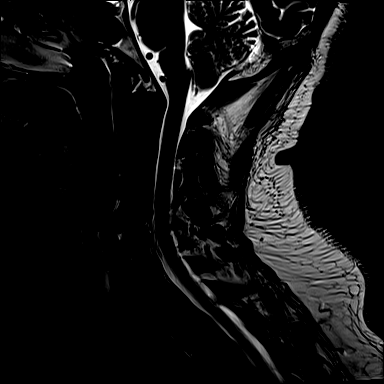
[im 12/15]
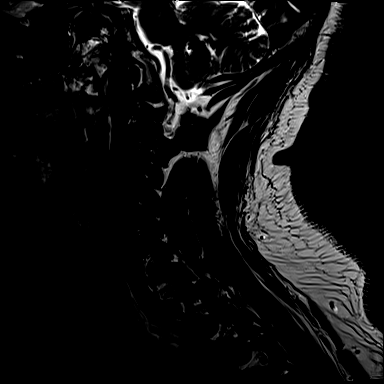
[im 15/15]
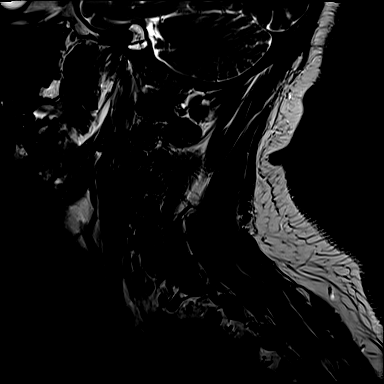

[Series 11: T1 · sagittal · 3.0mm · 0.82mm/px · 7 of 15 slices shown]
[im 1/15]
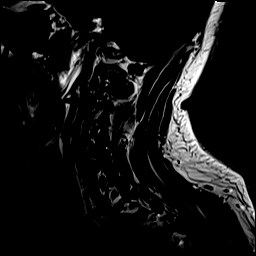
[im 3/15]
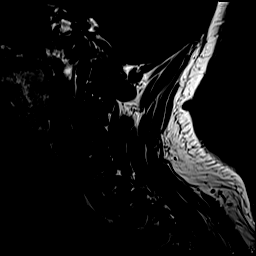
[im 5/15]
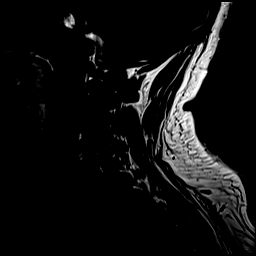
[im 8/15]
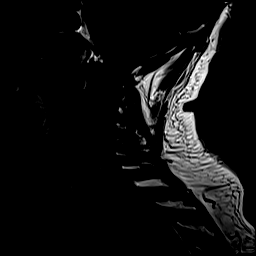
[im 10/15]
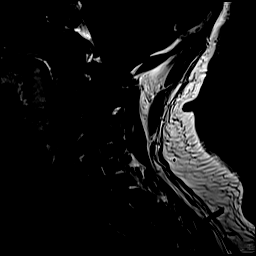
[im 12/15]
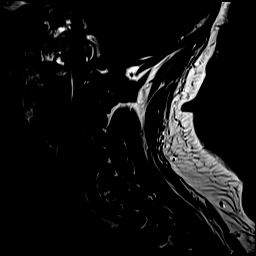
[im 15/15]
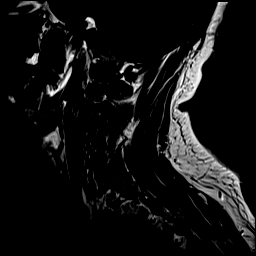

[Series 12: STIR · sagittal · 3.0mm · 0.41mm/px · 7 of 15 slices shown]
[im 1/15]
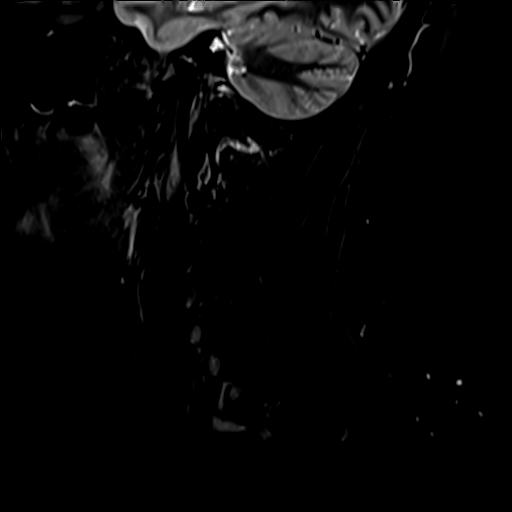
[im 3/15]
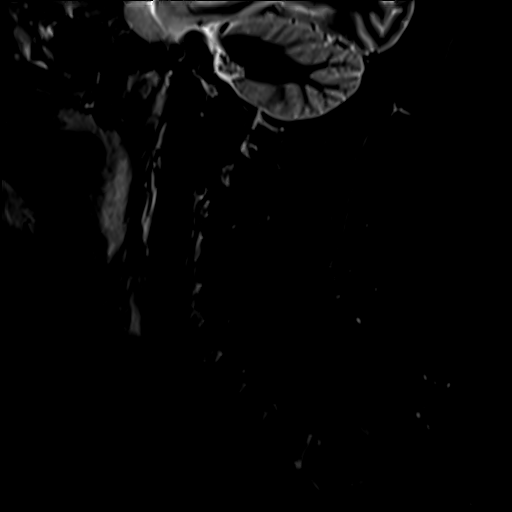
[im 5/15]
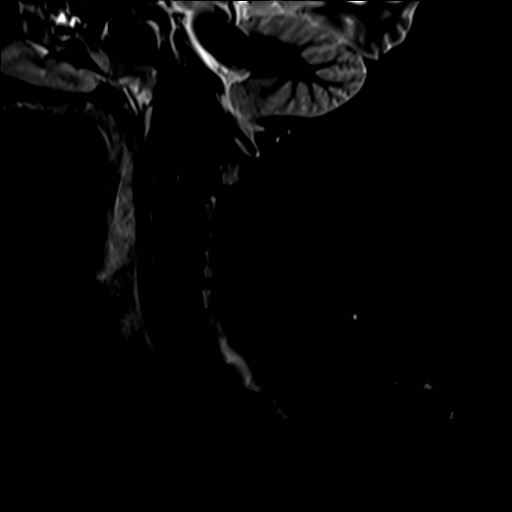
[im 8/15]
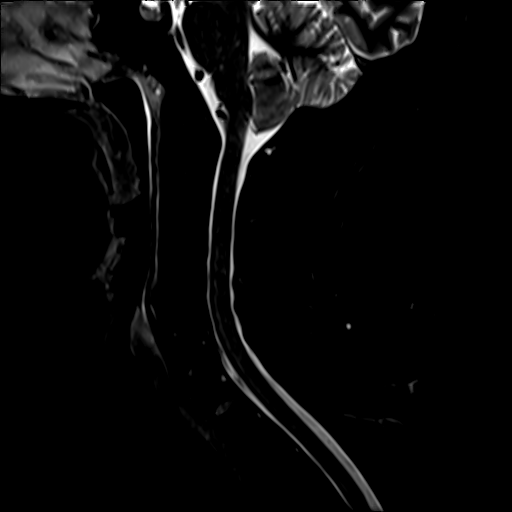
[im 10/15]
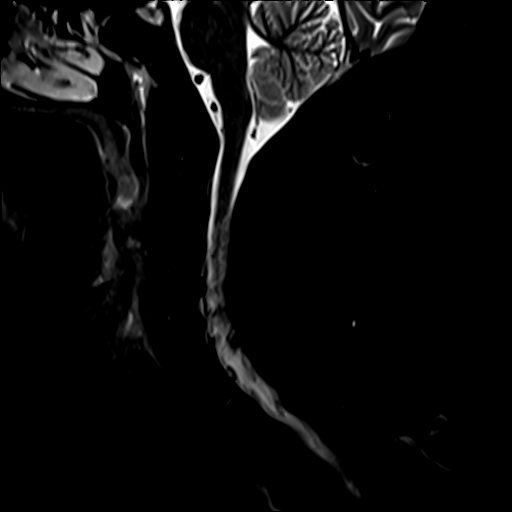
[im 12/15]
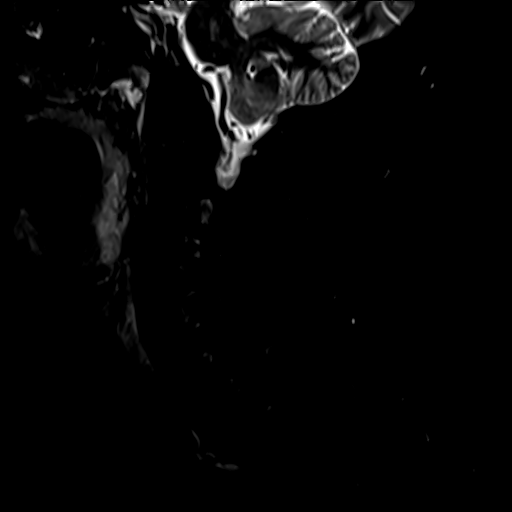
[im 15/15]
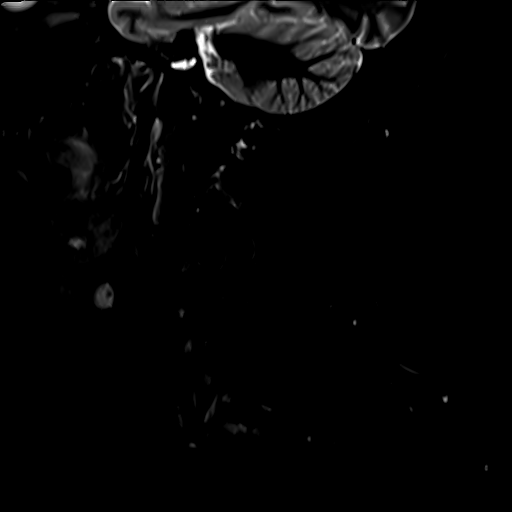

[Series 13: T2 · axial · 3.0mm · 0.62mm/px · z∈[-49,+44]mm · 14 of 30 slices shown (2 of 2)]
[im 1/30]
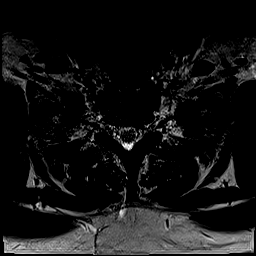
[im 3/30]
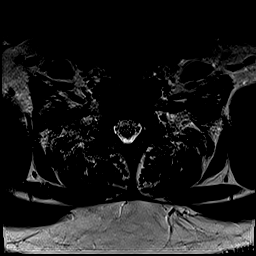
[im 5/30]
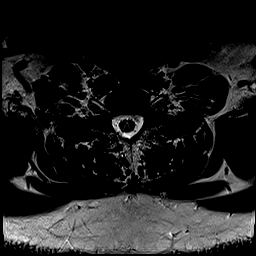
[im 7/30]
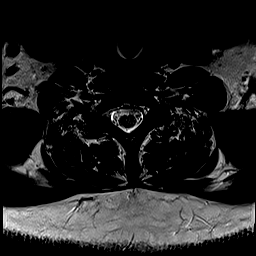
[im 9/30]
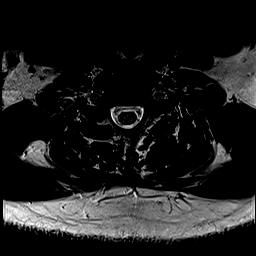
[im 12/30]
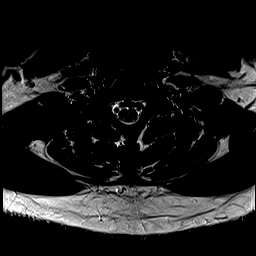
[im 14/30]
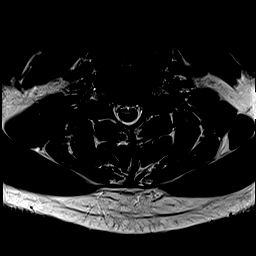
[im 16/30]
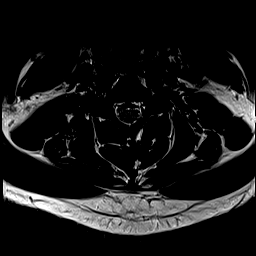
[im 18/30]
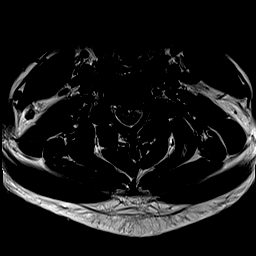
[im 21/30]
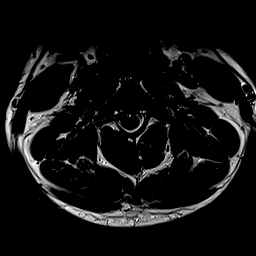
[im 23/30]
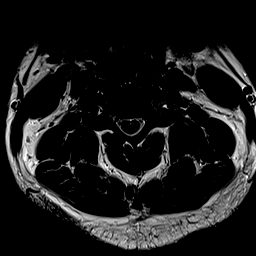
[im 25/30]
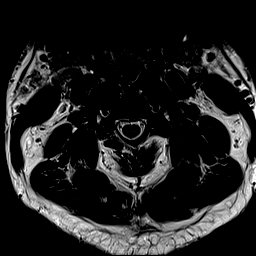
[im 27/30]
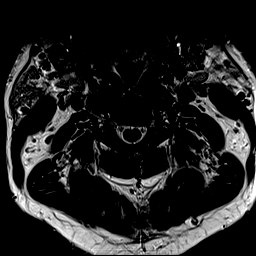
[im 30/30]
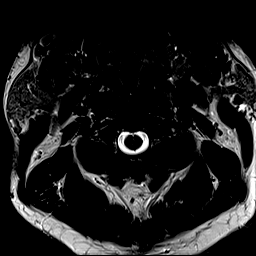

[Series 14: GRE · axial · 3.0mm · 0.83mm/px · z∈[-49,+44]mm · 11 of 30 slices shown]
[im 1/30]
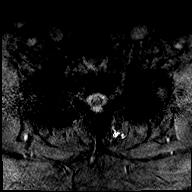
[im 3/30]
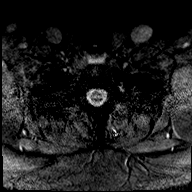
[im 5/30]
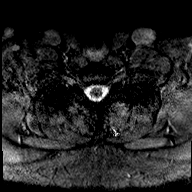
[im 7/30]
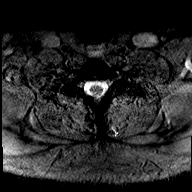
[im 9/30]
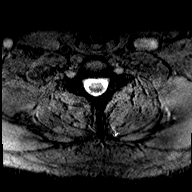
[im 12/30]
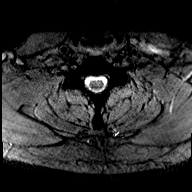
[im 14/30]
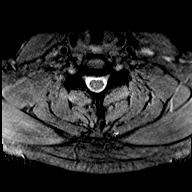
[im 16/30]
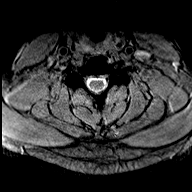
[im 21/30]
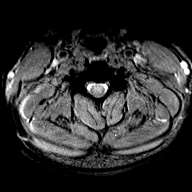
[im 25/30]
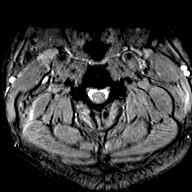
[im 30/30]
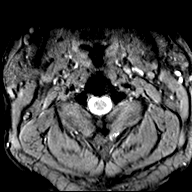

[45 of 48 positions shown; findings below may reference images not displayed]

FINDINGS: Mild motion degraded examination.

ALIGNMENT: Maintained cervical lordosis.  No malalignment.

VERTEBRAE/DISCS: Vertebral bodies are intact. Intervertebral disc
morphology's and signal are normal. Mild chronic discogenic endplate
changes C4-5, C5-6. No abnormal bone marrow signal.

CORD:Cervical spinal cord is normal morphology and signal
characteristics from the cervicomedullary junction to level of T1-2,
the most caudal well visualized level.

POSTERIOR FOSSA, VERTEBRAL ARTERIES, PARASPINAL TISSUES: No MR
findings of ligamentous injury. Vertebral artery flow voids present.
Included posterior fossa and paraspinal soft tissues are normal.
Cerebellar tonsils descend 2-3 mm below the foramen magnum without
criteria for Chiari 1 malformation.

DISC LEVELS:

C2-3: No disc bulge, canal stenosis nor neural foraminal narrowing.

C3-4: Uncovertebral hypertrophy and mild LEFT facet arthropathy. No
canal stenosis. Mild LEFT neural foraminal narrowing.

C4-5: Small broad-based disc bulge asymmetric to LEFT. Uncovertebral
hypertrophy and mild LEFT greater than RIGHT facet arthropathy. No
canal stenosis. Mild RIGHT, moderate LEFT neural foraminal
narrowing.

C5-6: Small broad-based disc bulge, uncovertebral hypertrophy and
mild facet arthropathy. No canal stenosis. Mild RIGHT, moderate LEFT
neural foraminal narrowing.

C5-6: Uncovertebral hypertrophy. Mild facet arthropathy without
canal stenosis or neural foraminal narrowing.

C7-T1: No disc bulge, canal stenosis nor neural foraminal narrowing.
Mild facet arthropathy. Small broad-based disc bulge,
IMPRESSION: Mild motion degraded examination.

Degenerative cervical spine without canal stenosis.

Moderate LEFT C4-5 and C5-6 neural foraminal narrowing.

## 2018-02-06 ENCOUNTER — Emergency Department (HOSPITAL_COMMUNITY): Payer: Medicare Other

## 2018-02-06 ENCOUNTER — Emergency Department (HOSPITAL_COMMUNITY)
Admission: EM | Admit: 2018-02-06 | Discharge: 2018-02-06 | Disposition: A | Discharge status: Home / Self Care | Payer: Medicare Other | Attending: Emergency Medicine | Admitting: Emergency Medicine

## 2018-02-06 ENCOUNTER — Encounter (HOSPITAL_COMMUNITY): Payer: Self-pay

## 2018-02-06 ENCOUNTER — Other Ambulatory Visit: Payer: Self-pay

## 2018-02-06 DIAGNOSIS — Z79899 Other long term (current) drug therapy: Secondary | ICD-10-CM | POA: Insufficient documentation

## 2018-02-06 DIAGNOSIS — M545 Low back pain, unspecified: Secondary | ICD-10-CM

## 2018-02-06 DIAGNOSIS — F17210 Nicotine dependence, cigarettes, uncomplicated: Secondary | ICD-10-CM | POA: Diagnosis not present

## 2018-02-06 DIAGNOSIS — Z96643 Presence of artificial hip joint, bilateral: Secondary | ICD-10-CM | POA: Insufficient documentation

## 2018-02-06 DIAGNOSIS — F329 Major depressive disorder, single episode, unspecified: Secondary | ICD-10-CM | POA: Insufficient documentation

## 2018-02-06 DIAGNOSIS — F419 Anxiety disorder, unspecified: Secondary | ICD-10-CM | POA: Diagnosis not present

## 2018-02-06 DIAGNOSIS — I10 Essential (primary) hypertension: Secondary | ICD-10-CM | POA: Diagnosis not present

## 2018-02-06 DIAGNOSIS — M25551 Pain in right hip: Secondary | ICD-10-CM | POA: Diagnosis not present

## 2018-02-06 DIAGNOSIS — Z7982 Long term (current) use of aspirin: Secondary | ICD-10-CM | POA: Diagnosis not present

## 2018-02-06 DIAGNOSIS — M25552 Pain in left hip: Secondary | ICD-10-CM | POA: Insufficient documentation

## 2018-02-06 MED ORDER — KETOROLAC TROMETHAMINE 15 MG/ML IJ SOLN
15.0000 mg | Freq: Once | INTRAMUSCULAR | Status: AC
  Administered 2018-02-06: 15 mg via INTRAMUSCULAR
  Filled 2018-02-06: qty 1

## 2018-02-06 MED ORDER — METHOCARBAMOL 500 MG PO TABS: 500.0000 mg | ORAL_TABLET | Freq: Two times a day (BID) | ORAL | 0 refills | Status: DC

## 2018-02-06 MED ORDER — NAPROXEN 375 MG PO TABS: 375.0000 mg | ORAL_TABLET | Freq: Two times a day (BID) | ORAL | 0 refills | Status: DC

## 2018-02-06 MED ORDER — OXYCODONE-ACETAMINOPHEN 5-325 MG PO TABS
1.0000 | ORAL_TABLET | Freq: Once | ORAL | Status: AC
  Administered 2018-02-06: 1 via ORAL
  Filled 2018-02-06: qty 1

## 2018-02-06 MED ORDER — METHOCARBAMOL 500 MG PO TABS
1000.0000 mg | ORAL_TABLET | Freq: Once | ORAL | Status: AC
  Administered 2018-02-06: 1000 mg via ORAL
  Filled 2018-02-06: qty 2

## 2018-02-06 MED ORDER — LIDOCAINE 5 % EX PTCH
1.0000 | MEDICATED_PATCH | CUTANEOUS | Status: DC
  Administered 2018-02-06: 1 via TRANSDERMAL
  Filled 2018-02-06: qty 1

## 2018-02-06 NOTE — ED Notes (Signed)
Patient transported to X-ray 

## 2018-02-06 NOTE — Discharge Instructions (Signed)
Your x-rays show that both of your hip replacements are intact, and there is some mild degenerative changes throughout her lumbar spine.  You may use muscle relaxers and naproxen for pain as well as over-the-counter salon pas lidocaine patches.  He will need to follow-up with your primary care doctor and/or your prior hip surgeon for further pain management and evaluation.  If you develop numbness or weakness in the legs, loss of control of your bowels or bladder, have severe difficulty walking, fevers, abdominal pain or any other new or concerning symptoms please return to the emergency department for reevaluation.

## 2018-02-06 NOTE — ED Triage Notes (Signed)
Pt c.o lower back pain while moving his trash can today and c.o bilateral hip pain for a while. Pt states he has been taking 10mg  oxycodone and has been out "for about 15 days" pt has had bilateral hip replacement surgery about 1.5 years ago.

## 2018-02-06 NOTE — ED Provider Notes (Signed)
[facility] EMERGENCY DEPARTMENT Provider Note   CSN: [id number] Arrival date & time: 02/06/18  1520     History   Chief Complaint Chief Complaint  Patient presents with  . Back Pain  . Hip Pain    HPI Spencer Fischer is a 52 y.o. male.  Spencer Fischer is a 52 y.o. male with a history of hypertension, sleep apnea, chronic back pain, depression and anxiety, who presents to the emergency department today for evaluation of low back pain and bilateral hip pain.  Patient reports today he was bending over to pull a bag out of a trash can when he had worsening pain in his back.  Had intermittent hip pain for a while now ever since he had bilateral hip replacements about 1.5 years ago.  Patient reports he has not followed up with his surgeon regarding this.  Patient reports he saw his regular doctor who prescribed him a course of 10 mg oxycodone, but he has been out of this for 15 days and has not had anything else to take for pain.  He reports pain is a constant dull ache that is worse with movement especially forward bending.  Pain does not radiate down into the legs, no numbness or weakness.  No associated loss of bowel or bladder control, no saddle anesthesia.  No associated abdominal pain or urinary symptoms.  No fevers or chills.  No history of cancer or IV drug use.  The history is provided by the patient.    Past Medical History:  Diagnosis Date  . Anxiety   . Arthritis   . Carpal tunnel syndrome   . Carpal tunnel syndrome, left   . Depression   . Hypertension   . Sleep apnea    wears CPAP    Patient Active Problem List   Diagnosis Date Noted  . Carpal tunnel syndrome, left upper limb 08/09/2016  . Obesity 06/06/2016  . OSA (obstructive sleep apnea) 02/17/2016  . Obesity (BMI 30.0-34.9) 12/02/2015  . Mild obstructive sleep apnea 10/01/2015  . HTN (hypertension) 10/01/2015  . Current smoker 10/01/2015  . Left leg claudication (HCC) 10/01/2015  . Elevated WBC  count 10/01/2015  . Chronic low back pain without sciatica 10/01/2015  . Carpal tunnel syndrome 10/01/2015  . Hypokalemia 04/14/2015  . GERD (gastroesophageal reflux disease) 04/13/2015  . 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    Past Surgical History:  Procedure Laterality Date  . BILATERAL ANTERIOR TOTAL HIP ARTHROPLASTY Bilateral 04/06/2015   Procedure: BILATERAL ANTERIOR TOTAL HIP ARTHROPLASTY;  Surgeon: Kathryne Hitch, MD;  Location: [facility] OR;  Service: Orthopedics;  Laterality: Bilateral;  . CARPAL TUNNEL RELEASE Left 08/24/2016   Procedure: LEFT OPEN CARPAL TUNNEL RELEASE;  Surgeon: Kathryne Hitch, MD;  Location: [facility] OR;  Service: Orthopedics;  Laterality: Left;  . KNEE SURGERY Left   . WISDOM TOOTH EXTRACTION          Home Medications    Prior to Admission medications   Medication Sig Start Date End Date Taking? Authorizing Provider  aspirin EC 325 MG tablet Take 325 mg by mouth daily.    [provider]  citalopram (CELEXA) 20 MG tablet Take 20 mg by mouth daily. 08/02/16   [provider]  citalopram (CELEXA) 20 MG tablet Take 1 tablet (20 mg total) by mouth daily. 01/03/17   Arby Barrette, MD  gabapentin (NEURONTIN) 100 MG capsule Take 3 capsules (300 mg total) by mouth 2 (two) times daily. 01/03/17  Arby Barrette, MD  gabapentin (NEURONTIN) 300 MG capsule Start with 1 tab po qhs X 1 week, then increase to 1 tab po bid X 1 week then 1 tab po tid prn Patient taking differently: Take 300 mg by mouth 2 (two) times daily as needed (for pain).  05/31/16   Andrena Mews, DO  HYDROcodone-acetaminophen (NORCO/VICODIN) 5-325 MG tablet Take 1-2 tablets by mouth 3 (three) times daily as needed for moderate pain. Patient not taking: Reported on 01/03/2017 09/06/16   Kathryne Hitch, MD  methocarbamol (ROBAXIN) 500 MG tablet Take 1 tablet (500 mg total) by mouth 2 (two) times daily. Patient not taking: Reported on 01/03/2017 07/06/16    Kathryne Hitch, MD  methocarbamol (ROBAXIN) 500 MG tablet Take 1 tablet (500 mg total) by mouth 2 (two) times daily. 01/03/17   Arby Barrette, MD  naproxen (NAPROSYN) 375 MG tablet Take 1 tablet by mouth 2 (two) times daily. ALTERNATING WITH OXYCODONE 12/12/16   [provider]  Oxycodone HCl 10 MG TABS Take 1 tablet (10 mg total) by mouth 4 (four) times daily as needed (pain). 08/24/16   Kathryne Hitch, MD  triamterene-hydrochlorothiazide (MAXZIDE-25) 37.5-25 MG tablet Take 1 tablet by mouth daily. 10/01/15   Funches, Gerilyn Nestle, MD  Ibuprofen-Diphenhydramine Cit (ADVIL PM PO) Take 2 tablets by mouth at bedtime as needed. For sleep  11/10/11  [provider]    Family History Family History  Problem Relation Age of Onset  . Hypertension Mother   . Hypertension Sister   . Hypertension Brother     Social History Social History   Tobacco Use  . Smoking status: Current Every Day Smoker    Packs/day: 0.25    Types: Cigarettes  . Smokeless tobacco: Never Used  Substance Use Topics  . Alcohol use: Yes    Comment: 4- 40 ounces of beer/week  . Drug use: No     Allergies   Patient has no known allergies.   Review of Systems Review of Systems  Constitutional: Negative for chills and fever.  HENT: Negative.   Respiratory: Negative for cough and shortness of breath.   Cardiovascular: Negative for chest pain.  Gastrointestinal: Negative for abdominal pain, blood in stool, constipation, diarrhea, nausea and vomiting.  Genitourinary: Negative for dysuria, flank pain, frequency and hematuria.  Musculoskeletal: Positive for arthralgias and back pain. Negative for gait problem and joint swelling.  Skin: Negative for color change and rash.  Neurological: Negative for syncope, weakness and numbness.     Physical Exam Updated Vital Signs BP (!) 152/106 (BP Location: Right Arm)   Pulse 89   Temp 98 F (36.7 C) (Oral)   Resp 20   Ht 5\' 11"  (1.803 m)   Wt  106.6 kg   SpO2 95%   BMI 32.78 kg/m   Physical Exam  Constitutional: He is oriented to person, place, and time. He appears well-developed and well-nourished. No distress.  HENT:  Head: Normocephalic and atraumatic.  Eyes: Right eye exhibits no discharge. Left eye exhibits no discharge.  Cardiovascular: Normal rate, regular rhythm, normal heart sounds and intact distal pulses.  Pulmonary/Chest: Effort normal and breath sounds normal. No stridor. No respiratory distress. He has no wheezes. He has no rales.  Respirations equal and unlabored, patient able to speak in full sentences, lungs clear to auscultation bilaterally  Abdominal: Soft. Bowel sounds are normal. He exhibits no distension and no mass. There is no tenderness. There is no guarding.  Abdomen soft, nondistended, nontender  to palpation in all quadrants without guarding or peritoneal signs  Musculoskeletal:  Tenderness to palpation over the midline of the lumbar spine without palpable deformity, no overlying erythema or skin changes, pain worsened with range of motion of bilateral lower extremities, negative straight leg raise bilaterally  Neurological: He is alert and oriented to person, place, and time. Coordination normal.  Alert and oriented, speech clear, following commands Moving all extremities without difficulty.  Bilateral lower extremities with 5/5 strength in proximal and distal muscle groups and with dorsi and plantar flexion, sensation intact throughout bilateral lower extremities, 2+ DP and TP pulses. 2+ patellar DTRs bilaterally  Skin: Skin is warm and dry. He is not diaphoretic.  Psychiatric: He has a normal mood and affect. His behavior is normal.  Nursing note and vitals reviewed.    ED Treatments / Results  Labs (all labs ordered are listed, but only abnormal results are displayed) Labs Reviewed - No data to display  EKG None  Radiology Dg Lumbar Spine Complete  Result Date: 02/06/2018 CLINICAL DATA:   Mid sagittal lumbar and sacral pain, pain in middle of the hips, fell 10 days ago on slick ramp EXAM: LUMBAR SPINE - COMPLETE 4+ VIEW COMPARISON:  02/24/2016 FINDINGS: Five non-rib-bearing lumbar vertebra. Osseous mineralization normal for technique. Vertebral body and disc space heights maintained. Scattered small endplate spurs. No acute fracture, subluxation, or bone destruction. No spondylolysis. SI joints preserved. IMPRESSION: Minimal degenerative disc disease changes. No acute abnormalities. Electronically Signed   By: Ulyses SouthwardMark  Boles M.D.   On: 02/06/2018 17:27   Dg Hips Bilat W Or Wo Pelvis 3-4 Views  Result Date: 02/06/2018 CLINICAL DATA:  Mid sagittal lumbar and sacral pain, pain in middle of the hips, fell 10 days ago on slick ramp EXAM: DG HIP (WITH OR WITHOUT PELVIS) 3-4V BILAT COMPARISON:  None FINDINGS: Osseous mineralization normal. Bowel hip prostheses identified. SI joints preserved. No acute fracture, dislocation, or bone destruction. IMPRESSION: BILATERAL hip prostheses. No acute abnormalities. Electronically Signed   By: Ulyses SouthwardMark  Boles M.D.   On: 02/06/2018 17:28    Procedures Procedures (including critical care time)  Medications Ordered in ED Medications  lidocaine (LIDODERM) 5 % 1 patch (1 patch Transdermal Patch Applied 02/06/18 1618)  methocarbamol (ROBAXIN) tablet 1,000 mg (1,000 mg Oral Given 02/06/18 1616)  ketorolac (TORADOL) 15 MG/ML injection 15 mg (15 mg Intramuscular Given 02/06/18 1617)  oxyCODONE-acetaminophen (PERCOCET/ROXICET) 5-325 MG per tablet 1 tablet (1 tablet Oral Given 02/06/18 1615)     Initial Impression / Assessment and Plan / ED Course  I have reviewed the triage vital signs and the nursing notes.  Pertinent labs & imaging results that were available during my care of the patient were reviewed by me and considered in my medical decision making (see chart for details).  Patient with back pain dural hip pain.  No neurological deficits and normal neuro  exam.  Patient can walk but states is painful.  Range of motion and strength in bilateral lower extremities intact no loss of bowel or bladder control.  No concern for cauda equina.  No fever, night sweats, weight loss, h/o cancer, IVDU.  X-rays of the lumbar spine show chronic degenerative changes without acute abnormality.  X-rays of bilateral hips show hip prostheses are in place without acute abnormality.  Pain improved significantly with treatment here in the ED.  RICE protocol and pain medicine indicated and discussed with patient.    Final Clinical Impressions(s) / ED Diagnoses   Final diagnoses:  Acute midline low back pain without sciatica  Bilateral hip pain    ED Discharge Orders         Ordered    naproxen (NAPROSYN) 375 MG tablet  2 times daily     02/06/18 1738    methocarbamol (ROBAXIN) 500 MG tablet  2 times daily     02/06/18 1738           Dartha Lodge, [place] 02/06/18 1841    Charlynne Pander, MD 02/09/18 2117

## 2018-02-06 NOTE — ED Notes (Signed)
ED Provider at bedside. 

## 2018-02-20 ENCOUNTER — Emergency Department (HOSPITAL_COMMUNITY)
Admission: EM | Admit: 2018-02-20 | Discharge: 2018-02-21 | Discharge status: Against Medical Advice | Payer: Medicare Other

## 2019-09-16 ENCOUNTER — Emergency Department (HOSPITAL_COMMUNITY): Payer: Medicare Other

## 2019-09-16 ENCOUNTER — Emergency Department (HOSPITAL_COMMUNITY)
Admission: EM | Admit: 2019-09-16 | Discharge: 2019-09-17 | Disposition: A | Discharge status: Home / Self Care | Payer: Medicare Other | Attending: Emergency Medicine | Admitting: Emergency Medicine

## 2019-09-16 ENCOUNTER — Encounter (HOSPITAL_COMMUNITY): Payer: Self-pay | Admitting: Pediatrics

## 2019-09-16 ENCOUNTER — Other Ambulatory Visit: Payer: Self-pay

## 2019-09-16 DIAGNOSIS — M25552 Pain in left hip: Secondary | ICD-10-CM | POA: Diagnosis present

## 2019-09-16 DIAGNOSIS — I10 Essential (primary) hypertension: Secondary | ICD-10-CM | POA: Diagnosis not present

## 2019-09-16 DIAGNOSIS — F17210 Nicotine dependence, cigarettes, uncomplicated: Secondary | ICD-10-CM | POA: Insufficient documentation

## 2019-09-16 DIAGNOSIS — Z79899 Other long term (current) drug therapy: Secondary | ICD-10-CM | POA: Diagnosis not present

## 2019-09-16 DIAGNOSIS — R05 Cough: Secondary | ICD-10-CM | POA: Insufficient documentation

## 2019-09-16 DIAGNOSIS — R1030 Lower abdominal pain, unspecified: Secondary | ICD-10-CM | POA: Insufficient documentation

## 2019-09-16 DIAGNOSIS — Z7982 Long term (current) use of aspirin: Secondary | ICD-10-CM | POA: Insufficient documentation

## 2019-09-16 DIAGNOSIS — M25559 Pain in unspecified hip: Secondary | ICD-10-CM

## 2019-09-16 DIAGNOSIS — R110 Nausea: Secondary | ICD-10-CM | POA: Insufficient documentation

## 2019-09-16 LAB — COMPREHENSIVE METABOLIC PANEL
ALT: 17 U/L (ref 0–44)
AST: 13 U/L — ABNORMAL LOW (ref 15–41)
Albumin: 3.9 g/dL (ref 3.5–5.0)
Alkaline Phosphatase: 114 U/L (ref 38–126)
Anion gap: 11 (ref 5–15)
BUN: 11 mg/dL (ref 6–20)
CO2: 19 mmol/L — ABNORMAL LOW (ref 22–32)
Calcium: 9.4 mg/dL (ref 8.9–10.3)
Chloride: 107 mmol/L (ref 98–111)
Creatinine, Ser: 0.99 mg/dL (ref 0.61–1.24)
GFR calc Af Amer: >60 mL/min (ref >60)
GFR calc non Af Amer: >60 mL/min (ref >60)
Glucose, Bld: 153 mg/dL — ABNORMAL HIGH (ref 70–99)
Potassium: 4 mmol/L (ref 3.5–5.1)
Sodium: 137 mmol/L (ref 135–145)
Total Bilirubin: 0.6 mg/dL (ref 0.3–1.2)
Total Protein: 7.5 g/dL (ref 6.5–8.1)

## 2019-09-16 LAB — CBC WITH DIFFERENTIAL/PLATELET
Abs Immature Granulocytes: 0.05 10*3/uL (ref 0.00–0.07)
Basophils Absolute: 0.1 10*3/uL (ref 0.0–0.1)
Basophils Relative: 0 %
Eosinophils Absolute: 0.1 10*3/uL (ref 0.0–0.5)
Eosinophils Relative: 1 %
HCT: 51.6 % (ref 39.0–52.0)
Hemoglobin: 17.1 g/dL — ABNORMAL HIGH (ref 13.0–17.0)
Immature Granulocytes: 0 %
Lymphocytes Relative: 26 %
Lymphs Abs: 2.9 10*3/uL (ref 0.7–4.0)
MCH: 31.1 pg (ref 26.0–34.0)
MCHC: 33.1 g/dL (ref 30.0–36.0)
MCV: 94 fL (ref 80.0–100.0)
Monocytes Absolute: 0.8 10*3/uL (ref 0.1–1.0)
Monocytes Relative: 7 %
Neutro Abs: 7.4 10*3/uL (ref 1.7–7.7)
Neutrophils Relative %: 66 %
Platelets: 298 10*3/uL (ref 150–400)
RBC: 5.49 MIL/uL (ref 4.22–5.81)
RDW: 14.4 % (ref 11.5–15.5)
WBC: 11.3 10*3/uL — ABNORMAL HIGH (ref 4.0–10.5)
nRBC: 0 % (ref 0.0–0.2)

## 2019-09-16 LAB — LIPASE, BLOOD: Lipase: 25 U/L (ref 11–51)

## 2019-09-16 NOTE — ED Triage Notes (Signed)
C/O hip pain on left side; stated he ran out of his Oxycodone yesterday; patient stated he also feels congested and pain when he coughs

## 2019-09-17 ENCOUNTER — Encounter (HOSPITAL_COMMUNITY): Payer: Self-pay | Admitting: Radiology

## 2019-09-17 ENCOUNTER — Emergency Department (HOSPITAL_COMMUNITY): Payer: Medicare Other

## 2019-09-17 DIAGNOSIS — M25552 Pain in left hip: Secondary | ICD-10-CM | POA: Diagnosis not present

## 2019-09-17 LAB — URINALYSIS, ROUTINE W REFLEX MICROSCOPIC
Bacteria, UA: NONE SEEN
Bilirubin Urine: NEGATIVE
Glucose, UA: NEGATIVE mg/dL
Ketones, ur: NEGATIVE mg/dL
Leukocytes,Ua: NEGATIVE
Nitrite: NEGATIVE
Protein, ur: NEGATIVE mg/dL
Specific Gravity, Urine: >1.046 — ABNORMAL HIGH (ref 1.005–1.030)
pH: 5 (ref 5.0–8.0)

## 2019-09-17 MED ORDER — ONDANSETRON HCL 4 MG/2ML IJ SOLN
4.0000 mg | Freq: Once | INTRAMUSCULAR | Status: AC
  Administered 2019-09-17: 02:00:00 4 mg via INTRAVENOUS
  Filled 2019-09-17: qty 2

## 2019-09-17 MED ORDER — KETOROLAC TROMETHAMINE 30 MG/ML IJ SOLN
30.0000 mg | Freq: Once | INTRAMUSCULAR | Status: AC
Start: 2019-09-17 — End: 2019-09-17
  Administered 2019-09-17: 06:00:00 30 mg via INTRAVENOUS
  Filled 2019-09-17: qty 1

## 2019-09-17 MED ORDER — NAPROXEN 500 MG PO TABS: 500.0000 mg | ORAL_TABLET | Freq: Two times a day (BID) | ORAL | 0 refills | Status: DC

## 2019-09-17 MED ORDER — MORPHINE SULFATE (PF) 4 MG/ML IV SOLN
4.0000 mg | Freq: Once | INTRAVENOUS | Status: AC
  Administered 2019-09-17: 02:00:00 4 mg via INTRAVENOUS
  Filled 2019-09-17: qty 1

## 2019-09-17 MED ORDER — SODIUM CHLORIDE 0.9 % IV BOLUS
1000.0000 mL | Freq: Once | INTRAVENOUS | Status: AC
  Administered 2019-09-17: 02:00:00 1000 mL via INTRAVENOUS

## 2019-09-17 MED ORDER — IOHEXOL 300 MG/ML  SOLN
100.0000 mL | Freq: Once | INTRAMUSCULAR | Status: AC | PRN
  Administered 2019-09-17: 100 mL via INTRAVENOUS

## 2019-09-17 NOTE — Discharge Instructions (Addendum)
Your evaluation today did not show the cause for your pain.  Please continue to take the oxycodone that you take for chronic pain.  Please start taking the naproxen twice a day.  You may also take acetaminophen for additional pain relief.  If you break out in a rash in the area where you are having pain, see a physician as soon as possible to get started on medication that is very specific for Shingles.

## 2019-09-17 NOTE — ED Notes (Signed)
Pt stating, "I'm ready for a room." Pt informed that we currently do not have any rooms available. Pt began yelling at this writer concerning the wait time.

## 2019-09-17 NOTE — ED Provider Notes (Signed)
[facility] EMERGENCY DEPARTMENT Provider Note   CSN: [id number] Arrival date & time: 09/16/19  1412   History Chief Complaint  Patient presents with  . Cough  . Hip Pain    Spencer Fischer is a 54 y.o. male.  The history is provided by the patient.  Cough Hip Pain  He has history of hypertension and bilateral hip replacements and comes in complaining of pain in his left hip and lower abdomen for the last 3 days.  Pain is getting worse.  It is worse with movement and palpation.  It is making it difficult to walk.  Pain is rated at 10/10.  He denies any trauma.  He denies fever, chills, sweats.  There has been nausea without vomiting.  He denies any urinary difficulty.  He is constipated with last bowel movement having occurred yesterday.  Pain does not radiate to the back.  As a separate complaint, he has had a mild, nonproductive cough for the last 2 days.  He denies dyspnea.  Past Medical History:  Diagnosis Date  . Anxiety   . Arthritis   . Carpal tunnel syndrome   . Carpal tunnel syndrome, left   . Depression   . Hypertension   . Sleep apnea    wears CPAP    Patient Active Problem List   Diagnosis Date Noted  . Carpal tunnel syndrome, left upper limb 08/09/2016  . Obesity 06/06/2016  . OSA (obstructive sleep apnea) 02/17/2016  . Obesity (BMI 30.0-34.9) 12/02/2015  . Mild obstructive sleep apnea 10/01/2015  . HTN (hypertension) 10/01/2015  . Current smoker 10/01/2015  . Left leg claudication (HCC) 10/01/2015  . Elevated WBC count 10/01/2015  . Chronic low back pain without sciatica 10/01/2015  . Carpal tunnel syndrome 10/01/2015  . Hypokalemia 04/14/2015  . GERD (gastroesophageal reflux disease) 04/13/2015  . 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    Past Surgical History:  Procedure Laterality Date  . BILATERAL ANTERIOR TOTAL HIP ARTHROPLASTY Bilateral 04/06/2015   Procedure: BILATERAL ANTERIOR TOTAL HIP ARTHROPLASTY;  Surgeon:  Kathryne Hitch, MD;  Location: [facility] OR;  Service: Orthopedics;  Laterality: Bilateral;  . CARPAL TUNNEL RELEASE Left 08/24/2016   Procedure: LEFT OPEN CARPAL TUNNEL RELEASE;  Surgeon: Kathryne Hitch, MD;  Location: [facility] OR;  Service: Orthopedics;  Laterality: Left;  . KNEE SURGERY Left   . WISDOM TOOTH EXTRACTION         Family History  Problem Relation Age of Onset  . Hypertension Mother   . Hypertension Sister   . Hypertension Brother     Social History   Tobacco Use  . Smoking status: Current Every Day Smoker    Packs/day: 0.25    Types: Cigarettes  . Smokeless tobacco: Never Used  Substance Use Topics  . Alcohol use: Yes    Comment: 4- 40 ounces of beer/week  . Drug use: No    Home Medications Prior to Admission medications   Medication Sig Start Date End Date Taking? Authorizing Provider  aspirin EC 325 MG tablet Take 325 mg by mouth daily.    [provider]  citalopram (CELEXA) 20 MG tablet Take 20 mg by mouth daily. 08/02/16   [provider]  citalopram (CELEXA) 20 MG tablet Take 1 tablet (20 mg total) by mouth daily. 01/03/17   Arby Barrette, MD  gabapentin (NEURONTIN) 100 MG capsule Take 3 capsules (300 mg total) by mouth 2 (two) times daily. 01/03/17   Arby Barrette, MD  gabapentin (  NEURONTIN) 300 MG capsule Start with 1 tab po qhs X 1 week, then increase to 1 tab po bid X 1 week then 1 tab po tid prn Patient taking differently: Take 300 mg by mouth 2 (two) times daily as needed (for pain).  05/31/16   Andrena Mews, DO  HYDROcodone-acetaminophen (NORCO/VICODIN) 5-325 MG tablet Take 1-2 tablets by mouth 3 (three) times daily as needed for moderate pain. Patient not taking: Reported on 01/03/2017 09/06/16   Kathryne Hitch, MD  methocarbamol (ROBAXIN) 500 MG tablet Take 1 tablet (500 mg total) by mouth 2 (two) times daily. 02/06/18   Dartha Lodge, PA-C  naproxen (NAPROSYN) 375 MG tablet Take 1 tablet (375 mg total) by mouth 2  (two) times daily. 02/06/18   Dartha Lodge, PA-C  Oxycodone HCl 10 MG TABS Take 1 tablet (10 mg total) by mouth 4 (four) times daily as needed (pain). 08/24/16   Kathryne Hitch, MD  triamterene-hydrochlorothiazide (MAXZIDE-25) 37.5-25 MG tablet Take 1 tablet by mouth daily. 10/01/15   Dessa Phi, MD    Allergies    Patient has no known allergies.  Review of Systems   Review of Systems  Respiratory: Positive for cough.   All other systems reviewed and are negative.   Physical Exam Updated Vital Signs BP (!) 134/115 (BP Location: Right Arm)   Pulse 99   Temp 98.5 F (36.9 C) (Oral)   Resp 20   Ht 5\' 11"  (1.803 m)   Wt 104.3 kg   SpO2 98%   BMI 32.08 kg/m   Physical Exam Vitals and nursing note reviewed.   54 year old male, resting comfortably and in no acute distress. Vital signs are significant for elevated blood pressure. Oxygen saturation is 98%, which is normal. Head is normocephalic and atraumatic. PERRLA, EOMI. Oropharynx is clear. Neck is nontender and supple without adenopathy or JVD. Back is nontender and there is no CVA tenderness. Lungs are clear without rales, wheezes, or rhonchi. Chest is nontender. Heart has regular rate and rhythm without murmur. Abdomen is soft, flat, with moderate tenderness in the left suprapubic area.  Tenderness does extend to the superior pubic ramus.  There is no rebound or guarding.  There are no masses or hepatosplenomegaly and peristalsis is hypoactive. Extremities have no cyanosis or edema.  There is no tenderness to palpation around the left hip, but there is pain on passive range of motion.  Range of motion is not restricted.  Remainder of extremity exam is unremarkable. Skin is warm and dry without rash. Neurologic: Mental status is normal, cranial nerves are intact, there are no motor or sensory deficits.  ED Results / Procedures / Treatments   Labs (all labs ordered are listed, but only abnormal results are  displayed) Labs Reviewed  CBC WITH DIFFERENTIAL/PLATELET - Abnormal; Notable for the following components:      Result Value   WBC 11.3 (*)    Hemoglobin 17.1 (*)    All other components within normal limits  COMPREHENSIVE METABOLIC PANEL - Abnormal; Notable for the following components:   CO2 19 (*)    Glucose, Bld 153 (*)    AST 13 (*)    All other components within normal limits  URINALYSIS, ROUTINE W REFLEX MICROSCOPIC - Abnormal; Notable for the following components:   Specific Gravity, Urine >1.046 (*)    Hgb urine dipstick SMALL (*)    All other components within normal limits  LIPASE, BLOOD    EKG None  Radiology DG Chest 2 View  Result Date: 09/16/2019 CLINICAL DATA:  Cough. Chest congestion. EXAM: CHEST - 2 VIEW COMPARISON:  01/03/2017 FINDINGS: The heart size and mediastinal contours are within normal limits. Both lungs are clear except for minimal peribronchial thickening. No effusions. The visualized skeletal structures are unremarkable. IMPRESSION: Minimal bronchitic changes. Electronically Signed   By: Lorriane Shire M.D.   On: 09/16/2019 15:33   CT ABDOMEN PELVIS W CONTRAST  Result Date: 09/17/2019 CLINICAL DATA:  Diverticulitis. Lower abdominal pain and diarrhea. EXAM: CT ABDOMEN AND PELVIS WITH CONTRAST TECHNIQUE: Multidetector CT imaging of the abdomen and pelvis was performed using the standard protocol following bolus administration of intravenous contrast. CONTRAST:  160mL OMNIPAQUE IOHEXOL 300 MG/ML  SOLN COMPARISON:  None. FINDINGS: Lower chest: There is some atelectasis at the left lung base.The heart size is normal. Hepatobiliary: There is decreased hepatic attenuation suggestive of hepatic steatosis. Normal gallbladder.There is no biliary ductal dilation. Pancreas: Normal contours without ductal dilatation. No peripancreatic fluid collection. Spleen: Unremarkable. Adrenals/Urinary Tract: --Adrenal glands: Unremarkable. --Right kidney/ureter: No hydronephrosis or  radiopaque kidney stones. --Left kidney/ureter: No hydronephrosis or radiopaque kidney stones. --Urinary bladder: Poorly evaluated secondary to extensive streak artifact. Stomach/Bowel: --Stomach/Duodenum: No hiatal hernia or other gastric abnormality. Normal duodenal course and caliber. --Small bowel: Unremarkable. --Colon: Unremarkable. --Appendix: Normal. Vascular/Lymphatic: Normal course and caliber of the major abdominal vessels. --No retroperitoneal lymphadenopathy. --No mesenteric lymphadenopathy. --No pelvic or inguinal lymphadenopathy. Reproductive: Unremarkable Other: No ascites or free air. The abdominal wall is normal. Musculoskeletal. No acute displaced fractures. IMPRESSION: 1. No acute findings in the abdomen or pelvis. 2. Normal appendix. 3. Hepatic steatosis. Electronically Signed   By: Constance Holster M.D.   On: 09/17/2019 02:50   DG HIP UNILAT WITH PELVIS 2-3 VIEWS LEFT  Result Date: 09/16/2019 CLINICAL DATA:  Left groin pain. EXAM: DG HIP (WITH OR WITHOUT PELVIS) 2-3V LEFT COMPARISON:  02/06/2018 FINDINGS: The components of the left total hip prosthesis appear in good position. Minimal bone resorption around the proximal portion of the femoral component, minimally increased since the prior study. No bone resorption around the distal component. Dystrophic calcifications around the hip joint are stable since 2019. The pelvic bones appear normal. Right total hip prosthesis also has slight bone resorption around the proximal portion of the femoral stem, unchanged since the prior study. IMPRESSION: Minimal bone resorption around the proximal portion of the femoral stems bilaterally. Otherwise, negative. Electronically Signed   By: Lorriane Shire M.D.   On: 09/16/2019 15:37    Procedures Procedures   Medications Ordered in ED Medications  morphine 4 MG/ML injection 4 mg (has no administration in time range)  ondansetron (ZOFRAN) injection 4 mg (has no administration in time range)    sodium chloride 0.9 % bolus 1,000 mL (has no administration in time range)    ED Course  I have reviewed the triage vital signs and the nursing notes.  Pertinent labs & imaging results that were available during my care of the patient were reviewed by me and considered in my medical decision making (see chart for details).  Left hip pain which actually seems to be more centered in the abdomen than the hip.  Cough which does not seem to be any significant problem.  Chest x-ray shows bronchitic changes.  Left hip x-ray does not show any acute process.  Images were independently viewed by me.  Labs show elevated random glucose of 153, mild leukocytosis without left shift, mild polycythemia.  He will be  sent for CT of abdomen and pelvis.  Diagnostic possibilities are wide ranging and include diverticulitis, urolithiasis, urinary tract infection.  He will be given morphine for pain.  Urinalysis has been requested.  CT scan is unremarkable, images independently reviewed by me.  Urinalysis is still pending.  Urinalysis shows high specific gravity, otherwise negative.  Cause for his pain is not clear.  Consider possible early herpes zoster.  This was explained to the patient.  He is a chronic pain patient and gets monthly prescriptions for oxycodone.  He is given prescription for naproxen and is to follow-up with his orthopedic physician.  Advised to return immediately should he develop a rash.  MDM Rules/Calculators/A&P  Final Clinical Impression(s) / ED Diagnoses Final diagnoses:  Pain in left hip    Rx / DC Orders ED Discharge Orders         Ordered    naproxen (NAPROSYN) 500 MG tablet  2 times daily     09/17/19 0659           Dione Booze, MD 09/17/19 [phone]

## 2020-09-15 ENCOUNTER — Ambulatory Visit: Payer: Medicare Other | Admitting: Orthopaedic Surgery

## 2020-10-07 ENCOUNTER — Ambulatory Visit (INDEPENDENT_AMBULATORY_CARE_PROVIDER_SITE_OTHER): Payer: Medicare Other

## 2020-10-07 ENCOUNTER — Other Ambulatory Visit: Payer: Self-pay

## 2020-10-07 ENCOUNTER — Ambulatory Visit (INDEPENDENT_AMBULATORY_CARE_PROVIDER_SITE_OTHER): Payer: Medicare Other | Admitting: Podiatry

## 2020-10-07 DIAGNOSIS — B351 Tinea unguium: Secondary | ICD-10-CM

## 2020-10-07 DIAGNOSIS — M722 Plantar fascial fibromatosis: Secondary | ICD-10-CM

## 2020-10-07 DIAGNOSIS — M79675 Pain in left toe(s): Secondary | ICD-10-CM

## 2020-10-07 DIAGNOSIS — M779 Enthesopathy, unspecified: Secondary | ICD-10-CM

## 2020-10-07 DIAGNOSIS — M79674 Pain in right toe(s): Secondary | ICD-10-CM | POA: Diagnosis not present

## 2020-10-07 DIAGNOSIS — M79671 Pain in right foot: Secondary | ICD-10-CM | POA: Diagnosis not present

## 2020-10-07 DIAGNOSIS — M79672 Pain in left foot: Secondary | ICD-10-CM

## 2020-10-07 MED ORDER — METHYLPREDNISOLONE 4 MG PO TBPK
ORAL_TABLET | ORAL | 0 refills | Status: DC
Start: 2020-10-07 — End: 2020-11-18

## 2020-10-12 NOTE — Progress Notes (Signed)
Subjective:   Patient ID: Spencer Fischer, male   DOB: 55 y.o.   MRN: [id number]   HPI 55 year old male presents the office today for concerns of bilateral heel pain but is now rating to the entire foot bilaterally.  He states that is consistent and ongoing for the last several months.  States that worse with walking for some time or standing.  Denies any recent injury or trauma.  No swelling.  He said no recent treatment.  Also asking for his nails be trimmed today as are thickened elongated he cannot do them himself.   Review of Systems  All other systems reviewed and are negative.  Past Medical History:  Diagnosis Date  . Anxiety   . Arthritis   . Carpal tunnel syndrome   . Carpal tunnel syndrome, left   . Depression   . Hypertension   . Sleep apnea    wears CPAP    Past Surgical History:  Procedure Laterality Date  . BILATERAL ANTERIOR TOTAL HIP ARTHROPLASTY Bilateral 04/06/2015   Procedure: BILATERAL ANTERIOR TOTAL HIP ARTHROPLASTY;  Surgeon: Kathryne Hitch, MD;  Location: [facility] OR;  Service: Orthopedics;  Laterality: Bilateral;  . CARPAL TUNNEL RELEASE Left 08/24/2016   Procedure: LEFT OPEN CARPAL TUNNEL RELEASE;  Surgeon: Kathryne Hitch, MD;  Location: [facility] OR;  Service: Orthopedics;  Laterality: Left;  . KNEE SURGERY Left   . WISDOM TOOTH EXTRACTION       Current Outpatient Medications:  .  methylPREDNISolone (MEDROL DOSEPAK) 4 MG TBPK tablet, Take as directed, Disp: 21 tablet, Rfl: 0 .  baclofen (LIORESAL) 20 MG tablet, Take 20 mg by mouth 3 (three) times daily., Disp: , Rfl:  .  citalopram (CELEXA) 20 MG tablet, Take 1 tablet (20 mg total) by mouth daily. (Patient not taking: Reported on 09/17/2019), Disp: 30 tablet, Rfl: 0 .  gabapentin (NEURONTIN) 100 MG capsule, Take 3 capsules (300 mg total) by mouth 2 (two) times daily. (Patient not taking: Reported on 09/17/2019), Disp: 90 capsule, Rfl: 0 .  gabapentin (NEURONTIN) 300 MG capsule, Start with 1 tab po qhs X 1  week, then increase to 1 tab po bid X 1 week then 1 tab po tid prn (Patient not taking: Reported on 09/17/2019), Disp: 90 capsule, Rfl: 1 .  lisinopril (ZESTRIL) 10 MG tablet, Take 10 mg by mouth daily., Disp: , Rfl:  .  naproxen (NAPROSYN) 500 MG tablet, Take 1 tablet (500 mg total) by mouth 2 (two) times daily., Disp: 30 tablet, Rfl: 0 .  Oxycodone HCl 10 MG TABS, Take 1 tablet (10 mg total) by mouth 4 (four) times daily as needed (pain)., Disp: 60 tablet, Rfl: 0 .  oxymetazoline (MUCINEX SINUS-MAX CLEAR & COOL) 0.05 % nasal spray, Place 1 spray into both nostrils 2 (two) times daily as needed for congestion., Disp: , Rfl:  .  tamsulosin (FLOMAX) 0.4 MG CAPS capsule, Take 0.4 mg by mouth., Disp: , Rfl:  .  triamterene-hydrochlorothiazide (MAXZIDE-25) 37.5-25 MG tablet, Take 1 tablet by mouth daily. (Patient not taking: Reported on 09/17/2019), Disp: 30 tablet, Rfl: 3  No Known Allergies       Objective:  Physical Exam  General: AAO x3, NAD  Dermatological: Nails are hypertrophic, dystrophic, brittle, discolored, elongated 10. No surrounding redness or drainage. Tenderness nails 1-5 bilaterally. No open lesions or pre-ulcerative lesions are identified today.  Vascular: Dorsalis Pedis artery and Posterior Tibial artery pedal pulses are 2/4 bilateral with immedate capillary fill time.There is no pain with  calf compression, swelling, warmth, erythema.  Does not endorse any claudication symptoms today.  Neruologic: Grossly intact via light touch bilateral.  Musculoskeletal: No joint tenderness along the plantar medial tubercle of the calcaneus at the insertion of plantar fascia bilaterally.  However upon palpation he has generalized tenderness globally to both feet with the left side worse than the right.  Flexor, extensor tendons appear to be intact.  Achilles tendon intact.  Muscular strength 5/5 in all groups tested bilateral.  Gait: Unassisted, Nonantalgic.       Assessment:   Chronic  foot left side worse than right, plan fasciitis; symptomatic onychomycosis     Plan:  -Treatment options discussed including all alternatives, risks, and complications -Etiology of symptoms were discussed -X-rays were obtained and reviewed with the patient.  No evidence of acute fracture or stress fracture identified today. -Prescribed Medrol Dosepak.  Trial ankle brace was dispensed.  Discussed stretching, icing daily.  Discussed wearing supportive shoes.  If symptoms continue MRI. -Nails sharply debrided x10 without any complications or bleeding.  Vivi Barrack DPM

## 2020-11-18 ENCOUNTER — Ambulatory Visit (INDEPENDENT_AMBULATORY_CARE_PROVIDER_SITE_OTHER): Payer: Medicare HMO | Admitting: Podiatry

## 2020-11-18 ENCOUNTER — Other Ambulatory Visit: Payer: Self-pay

## 2020-11-18 DIAGNOSIS — S96912A Strain of unspecified muscle and tendon at ankle and foot level, left foot, initial encounter: Secondary | ICD-10-CM

## 2020-11-18 DIAGNOSIS — M79675 Pain in left toe(s): Secondary | ICD-10-CM | POA: Diagnosis not present

## 2020-11-18 DIAGNOSIS — B351 Tinea unguium: Secondary | ICD-10-CM | POA: Diagnosis not present

## 2020-11-18 DIAGNOSIS — M722 Plantar fascial fibromatosis: Secondary | ICD-10-CM

## 2020-11-18 DIAGNOSIS — M79674 Pain in right toe(s): Secondary | ICD-10-CM | POA: Diagnosis not present

## 2020-11-18 DIAGNOSIS — M779 Enthesopathy, unspecified: Secondary | ICD-10-CM

## 2020-11-18 MED ORDER — METHYLPREDNISOLONE 4 MG PO TBPK: ORAL_TABLET | ORAL | 0 refills | Status: DC

## 2020-11-23 ENCOUNTER — Ambulatory Visit (HOSPITAL_COMMUNITY)
Admission: EM | Admit: 2020-11-23 | Discharge: 2020-11-23 | Disposition: A | Discharge status: Home / Self Care | Payer: Medicaid Other | Attending: Psychiatry | Admitting: Psychiatry

## 2020-11-23 ENCOUNTER — Other Ambulatory Visit: Payer: Self-pay

## 2020-11-23 DIAGNOSIS — F331 Major depressive disorder, recurrent, moderate: Secondary | ICD-10-CM | POA: Insufficient documentation

## 2020-11-23 MED ORDER — HYDROXYZINE HCL 25 MG PO TABS: 25.0000 mg | ORAL_TABLET | Freq: Three times a day (TID) | ORAL | 0 refills | Status: DC | PRN

## 2020-11-23 MED ORDER — CITALOPRAM HYDROBROMIDE 10 MG PO TABS: 10.0000 mg | ORAL_TABLET | Freq: Every day | ORAL | 0 refills | Status: DC

## 2020-11-23 NOTE — ED Notes (Signed)
Pt discharged in no acute distress. Resources given. Safety maintained. 

## 2020-11-23 NOTE — Discharge Instructions (Addendum)

## 2020-11-23 NOTE — BH Assessment (Signed)
Pt to [facility] due to worsening depression. Pt denies SI, HI, AVH. Released from jail in March and reports having increased anxiety, worsening depression and irritable. Pt states " I have not self worth". Reports death of two family members due to covid and mom is currently in the hospital. Hx of outpatient services with [facility] two years ago. Dx with bipolar disorder.   Pt is routine.

## 2020-11-23 NOTE — ED Notes (Signed)
Patient received AVS with follow up community resources and medication education.

## 2020-11-23 NOTE — ED Provider Notes (Signed)
[facility] Medical Screening Exam  Patient Name: Spencer Fischer MRN: [id number] Date of Evaluation: 11/23/20 Chief Complaint:   Diagnosis:  Final diagnoses:  MDD (major depressive disorder), recurrent episode, moderate (HCC)    History of Present illness: Spencer Fischer is a 55 y.o. male patient presented to [facility] as a walk in alone with complaints of, "I am feeling depressed".  Spencer Fischer, 55 y.o., male patient seen face to face by this provider, consulted with Dr. Lucianne Muss; and chart reviewed on 11/23/20.    During evaluation Spencer Fischer is sitting in no acute distress.  He makes Fischer eye contact.  He is alert, oriented x 4, anxious and cooperative.  His  mood is depressed with congruent affect.  Reports a decrease in sleep and an increase in his appetite.  Reports he feels worthless, tearful, and irritable irritable at times.  He does not appear to be responding to internal/external stimuli or delusional thoughts.  Patient denies suicidal/self-harm/homicidal ideation, psychosis, and paranoia.  Patient contracts for safety.  States he has no immediate safety concerns with returning home.  Patient states for the last 4 months he has been dealing with a lot.  States he is lost two family members due to COVID, and his mother is in the hospital.  States these events have caused his depression to worsen.  States he has been out of jail for about 15 months and it has been a difficult transition.  History of cocaine and alcohol use but has not used either in over a year and a half.   Patient had services with [facility] 2 years ago but no longer has services with them.  Reports he does not know what he was diagnosed with, nor does he know the medications that he was taking when he was seeing a provider at [facility].   Psychiatric Specialty Exam  Presentation  General Appearance:Appropriate for Environment; Fairly Groomed  Eye Contact:Fischer  Speech:Clear and Coherent; Normal  Rate  Speech Volume:Normal  Handedness:Right   Mood and Affect  Mood:Depressed; Anxious  Affect:Congruent   Thought Process  Thought Processes:Coherent  Descriptions of Associations:Intact  Orientation:Full (Time, Place and Person)  Thought Content:Logical    Hallucinations:None  Ideas of Reference:None  Suicidal Thoughts:No  Homicidal Thoughts:No   Sensorium  Memory:Immediate Fischer; Recent Fischer; Remote Fischer  Judgment:Fischer  Insight:Fischer   Executive Functions  Concentration:Fischer  Attention Span:Fischer  Recall:Fischer  Fund of Knowledge:Fischer  Language:Fischer   Psychomotor Activity  Psychomotor Activity:Normal   Assets  Assets:Communication Skills; Desire for Improvement; Financial Resources/Insurance; Housing; Physical Health; Resilience; Social Support   Sleep  Sleep:Fair  Number of hours: 5   No data recorded  Physical Exam: Physical Exam Vitals and nursing note reviewed.  Constitutional:      Appearance: He is well-developed.  HENT:     Head: Normocephalic and atraumatic.     Right Ear: External ear normal.     Left Ear: External ear normal.  Eyes:     Conjunctiva/sclera: Conjunctivae normal.  Cardiovascular:     Rate and Rhythm: Normal rate and regular rhythm.  Pulmonary:     Effort: Pulmonary effort is normal. No respiratory distress.  Abdominal:     Palpations: Abdomen is soft.  Musculoskeletal:     Cervical back: Neck supple.  Skin:    General: Skin is warm and dry.  Neurological:     Mental Status: He is alert.  Psychiatric:        Attention  and Perception: Attention and perception normal.        Mood and Affect: Mood is anxious and depressed.        Speech: Speech normal.        Behavior: Behavior normal.        Thought Content: Thought content normal.        Cognition and Memory: Cognition normal.        Judgment: Judgment is impulsive.   Review of Systems  Constitutional: Negative.   HENT: Negative.    Eyes:  Negative.   Respiratory: Negative.    Cardiovascular: Negative.   Musculoskeletal: Negative.   Skin: Negative.   Blood pressure 132/87, pulse 75, resp. rate 16, SpO2 99 %. There is no height or weight on file to calculate BMI.  Musculoskeletal: Strength & Muscle Tone: within normal limits Gait & Station: normal Patient leans: N/A   [facility] MSE Discharge Disposition for Follow up and Recommendations: Based on my evaluation the patient does not appear to have an emergency medical condition and can be discharged with resources and follow up care in outpatient services for Medication Management and Individual Therapy  Discharge patient.  Patient does not meet criteria for inpatient admission.  Sent prescriptions to patient's pharmacy for Celexa 10 mg p.o. daily and hydroxyzine 25 mg PO TID PRN  Provided resources for outpatient psychiatric services.  Included instructions for [facility] outpatient services on the second floor.  Provided walk-in hours for open access.   Ardis Hughs, NP 11/23/2020, 9:56 PM

## 2020-11-24 NOTE — Progress Notes (Signed)
Subjective: 55 year old male presents the office today for follow-up evaluation and continued pain in the left side worse than the right.  He states that he completed the Medrol Dosepak and still having discomfort.  He also gets swelling.  No redness or warmth associate with the swelling.  No recent injury or trauma.  Also asking for nails be trimmed today as are thickened elongated he cannot trim himself. Denies any systemic complaints such as fevers, chills, nausea, vomiting. No acute changes since last appointment, and no other complaints at this time.   Objective: AAO x3, NAD DP/PT pulses palpable bilaterally, CRT less than 3 seconds There is continuation of tenderness on plantar medial tubercle of the calcaneus at the insertion of plantar fascial bilaterally with a left side worse than the right.  He points more along the posterior aspect also he is discomfort there is localized edema to the posterior aspect the calcaneus.  There is no pain with Achilles tendon.  Achilles tendon appears to be intact.  No pain with lateral compression of calcaneus.  MMT 5/5. Nails are hypertrophic, dystrophic, brittle, discolored, elongated 10. No surrounding redness or drainage. Tenderness nails 1-5 bilaterally. No open lesions or pre-ulcerative lesions are identified today. No pain with calf compression, swelling, warmth, erythema  Assessment: 55 year old male with continued heel pain, rule out partial tear of Achilles given swelling/plantar fascial tear; symptomatic onychomycosis  Plan: -All treatment options discussed with the patient including all alternatives, risks, complications.  -At this point given his ongoing symptoms of his knee swelling and pain going to order an MRI of the left ankle to rule out partial tear of the Achilles tendon or plantar fascial.  Continue with the brace for now.  Ice elevation. -Nails sharply debrided x10 without any complications or bleeding -Patient encouraged to call the  office with any questions, concerns, change in symptoms.   Vivi Barrack DPM

## 2020-11-30 ENCOUNTER — Ambulatory Visit (HOSPITAL_COMMUNITY): Payer: Medicare HMO | Admitting: Licensed Clinical Social Worker

## 2020-11-30 ENCOUNTER — Other Ambulatory Visit: Payer: Self-pay

## 2020-11-30 DIAGNOSIS — F331 Major depressive disorder, recurrent, moderate: Secondary | ICD-10-CM

## 2020-12-01 ENCOUNTER — Telehealth: Payer: Self-pay | Admitting: *Deleted
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
## 2020-12-01 NOTE — Telephone Encounter (Signed)
Called and spoke with Spencer Fischer at [facility] and they are calling the patient today. [name]

## 2020-12-01 NOTE — Telephone Encounter (Signed)
-----   Message from Matthew R Wagoner, DPM sent at 11/24/2020  9:04 AM EDT ----- MRI ordered of left ankle. Can you please follow up on this? Thanks!  

## 2020-12-02 ENCOUNTER — Telehealth: Payer: Self-pay | Admitting: *Deleted

## 2020-12-02 NOTE — Telephone Encounter (Signed)
Patient has an appointment on 12-15-2020 at 11:00 am at [facility] imaging. [name]

## 2020-12-02 NOTE — Telephone Encounter (Signed)
-----   Message from Vivi Barrack, DPM sent at 11/24/2020  9:04 AM EDT ----- MRI ordered of left ankle. Can you please follow up on this? Thanks!

## 2020-12-15 ENCOUNTER — Ambulatory Visit
Admission: RE | Admit: 2020-12-15 | Discharge: 2020-12-15 | Disposition: A | Discharge status: Home / Self Care | Payer: Medicare HMO | Source: Ambulatory Visit | Attending: Podiatry | Admitting: Podiatry

## 2020-12-15 ENCOUNTER — Other Ambulatory Visit: Payer: Self-pay

## 2020-12-15 DIAGNOSIS — S96912A Strain of unspecified muscle and tendon at ankle and foot level, left foot, initial encounter: Secondary | ICD-10-CM

## 2020-12-21 ENCOUNTER — Telehealth: Payer: Self-pay | Admitting: *Deleted

## 2020-12-21 NOTE — Telephone Encounter (Signed)
-----   Message from Vivi Barrack, DPM sent at 12/20/2020  5:27 PM EDT ----- [name]- can you please let him know that the Achilles does show a small partial tear in the Achilles tendon. I would like to put him in a walking boot. Can you let him know?   [name]- can you schedule him a follow up? Thanks!

## 2020-12-21 NOTE — Telephone Encounter (Signed)
Called and left a message for the patient to come by and get a cam boot and I relayed the message per Dr Ardelle Anton. [name]

## 2020-12-23 ENCOUNTER — Ambulatory Visit (INDEPENDENT_AMBULATORY_CARE_PROVIDER_SITE_OTHER): Payer: Medicare HMO | Admitting: Physician Assistant

## 2020-12-23 ENCOUNTER — Encounter (HOSPITAL_COMMUNITY): Payer: Self-pay | Admitting: Physician Assistant

## 2020-12-23 ENCOUNTER — Other Ambulatory Visit: Payer: Self-pay

## 2020-12-23 VITALS — BP 143/74 | HR 72 | Ht 71.0 in | Wt 263.0 lb

## 2020-12-23 DIAGNOSIS — F332 Major depressive disorder, recurrent severe without psychotic features: Secondary | ICD-10-CM | POA: Diagnosis not present

## 2020-12-23 DIAGNOSIS — F5105 Insomnia due to other mental disorder: Secondary | ICD-10-CM | POA: Diagnosis not present

## 2020-12-23 DIAGNOSIS — F411 Generalized anxiety disorder: Secondary | ICD-10-CM | POA: Diagnosis not present

## 2020-12-23 DIAGNOSIS — F99 Mental disorder, not otherwise specified: Secondary | ICD-10-CM | POA: Insufficient documentation

## 2020-12-23 MED ORDER — HYDROXYZINE HCL 25 MG PO TABS: 25.0000 mg | ORAL_TABLET | Freq: Three times a day (TID) | ORAL | 1 refills | Status: DC | PRN

## 2020-12-23 MED ORDER — CITALOPRAM HYDROBROMIDE 10 MG PO TABS: 10.0000 mg | ORAL_TABLET | Freq: Every day | ORAL | 1 refills | Status: DC

## 2020-12-23 MED ORDER — TRAZODONE HCL 100 MG PO TABS: 100.0000 mg | ORAL_TABLET | Freq: Every day | ORAL | 1 refills | Status: DC

## 2020-12-23 NOTE — Progress Notes (Signed)
Psychiatric Initial Adult Assessment   Patient Identification: Spencer Fischer MRN:  [id number] Date of Evaluation:  12/23/2020 Referral Source: Walk-in Chief Complaint:   Chief Complaint   Medication Management    Visit Diagnosis:    ICD-10-CM   1. Generalized anxiety disorder  F41.1 hydrOXYzine (ATARAX/VISTARIL) 25 MG tablet    2. Severe episode of recurrent major depressive disorder, without psychotic features (HCC)  F33.2 citalopram (CELEXA) 10 MG tablet    3. Insomnia due to other mental disorder  F51.05 traZODone (DESYREL) 100 MG tablet   F99       History of Present Illness:    Spencer Fischer is a 55 year old male with a past psychiatric history significant for depression who presents to [facility] as a walk-in for medication management.  Patient states that he presented to [facility] a month ago with a chief complaint of depression, anxiety, and difficulty sleeping.  Patient was discharged from [facility] and written a prescription for Celexa 10 mg daily and hydroxyzine 25 mg 3 times daily as needed for the management of his depression and anxiety.  Patient endorses improved mood and lessened anxiety since being on his medications.  Today, patient reports that he has ran out of his medications and would like refills.  He states that his medications allowed him to be calm and that he is currently feeling antsy.  Patient rates his anxiety an 8 out of 10 and states that he is constantly worrying about too much due to uncertainty of his future.  Patient describes himself as being overly cautious as a result of his anxiety.  Patient's stressors include being unable to exercise due to COVID and being unable to go to a dentist due to lack of dental insurance.  Patient states that his sleep has also been impaired and states that by the time he goes to sleep he wakes up immediately.  Patient endorses irritability due to lack of  sleep.  Patient denies past history of hospitalization due to mental health.  He further denies past suicide attempts and does not engage in self-injurious behavior.  A PHQ-9 screen was performed with the patient scoring a 20.  A GAD-7 screen was also performed with the patient scoring a 21.  A [place] Suicide Severity Rating Scale was performed with the patient being considered not a suicide risk.  Patient is alert and oriented x4, pleasant, calm, cooperative, and fully engaged in conversation during the encounter.  Patient denies suicidal or homicidal ideations.  He further denies auditory or visual hallucinations and does not appear to be responding to internal/external stimuli.  Patient endorses poor sleep and receives on average 3-1/2 to 4 hours of sleep each night that is often interrupted.  Patient endorses good appetite and eats on average 2 meals a day.  Patient states that he has been on a diet as of late and has been incorporating more fruits and vegetables in his diet.  Patient endorses alcohol consumption sparingly.  Patient endorses tobacco use and smokes on average 5 cigarettes/day.  Patient denies illicit drug use but states that he has a past history of using cocaine.  He reports that he last used cocaine 2 years ago.  Associated Signs/Symptoms: Depression Symptoms:  depressed mood, anhedonia, insomnia, psychomotor agitation, psychomotor retardation, fatigue, feelings of worthlessness/guilt, difficulty concentrating, recurrent thoughts of death, anxiety, loss of energy/fatigue, disturbed sleep, weight gain, increased appetite, (Hypo) Manic Symptoms:  Flight of Ideas, Grandiosity, Irritable  Mood, Labiality of Mood, Anxiety Symptoms:  Agoraphobia, Excessive Worry, Specific Phobias, Psychotic Symptoms:  Paranoia, PTSD Symptoms: Had a traumatic exposure:  Patient states that he has had a bunch of deaths within his family. He reports that he had a cousin that committed  suicide and one that died of covid. Had a traumatic exposure in the last month:  n/a Re-experiencing:  Flashbacks Intrusive Thoughts Hypervigilance:  No Hyperarousal:  Increased Startle Response Sleep Avoidance:  None  Past Psychiatric History:  Depression  Previous Psychotropic Medications: Yes   Substance Abuse History in the last 12 months:  No.  Consequences of Substance Abuse: Medical Consequences:  None Legal Consequences:  None Family Consequences:  Patient states that his family members neglected him and did not want to be around him. Blackouts:  Nonee DT's: N/A Withdrawal Symptoms:   None  Past Medical History:  Past Medical History:  Diagnosis Date   Anxiety    Arthritis    Carpal tunnel syndrome    Carpal tunnel syndrome, left    Depression    Hypertension    Sleep apnea    wears CPAP    Past Surgical History:  Procedure Laterality Date   BILATERAL ANTERIOR TOTAL HIP ARTHROPLASTY Bilateral 04/06/2015   Procedure: BILATERAL ANTERIOR TOTAL HIP ARTHROPLASTY;  Surgeon: Kathryne Hitch, MD;  Location: [facility] OR;  Service: Orthopedics;  Laterality: Bilateral;   CARPAL TUNNEL RELEASE Left 08/24/2016   Procedure: LEFT OPEN CARPAL TUNNEL RELEASE;  Surgeon: Kathryne Hitch, MD;  Location: [facility] OR;  Service: Orthopedics;  Laterality: Left;   KNEE SURGERY Left    WISDOM TOOTH EXTRACTION      Family Psychiatric History:  Patient denies a family history of psychiatric illness  Family History:  Family History  Problem Relation Age of Onset   Hypertension Mother    Hypertension Sister    Hypertension Brother     Social History:   Social History   Socioeconomic History   Marital status: Single    Spouse name: Not on file   Number of children: Not on file   Years of education: Not on file   Highest education level: Not on file  Occupational History   Not on file  Tobacco Use   Smoking status: Every Day    Packs/day: 0.25    Types: Cigarettes    Smokeless tobacco: Never  Substance and Sexual Activity   Alcohol use: Yes    Comment: 4- 40 ounces of beer/week   Drug use: No   Sexual activity: Not on file  Other Topics Concern   Not on file  Social History Narrative   Not on file   Social Determinants of Health   Financial Resource Strain: Not on file  Food Insecurity: Not on file  Transportation Needs: Not on file  Physical Activity: Not on file  Stress: Not on file  Social Connections: Not on file    Additional Social History:  Patient reports that he is currently unemployed but does endorse a good support network.  Allergies:  No Known Allergies  Metabolic Disorder Labs: Lab Results  Component Value Date   HGBA1C 5.46f 10/01/2015   No results found for: PROLACTIN No results found for: CHOL, TRIG, HDL, CHOLHDL, VLDL, LDLCALC Lab Results  Component Value Date   TSH 3.307 04/16/2015    Therapeutic Level Labs: No results found for: LITHIUM No results found for: CBMZ No results found for: VALPROATE  Current Medications: Current Outpatient Medications  Medication Sig Dispense Refill  traZODone (DESYREL) 100 MG tablet Take 1 tablet (100 mg total) by mouth at bedtime. 30 tablet 1   baclofen (LIORESAL) 20 MG tablet Take 20 mg by mouth 3 (three) times daily as needed.     citalopram (CELEXA) 10 MG tablet Take 1 tablet (10 mg total) by mouth daily. 30 tablet 1   hydrOXYzine (ATARAX/VISTARIL) 25 MG tablet Take 1 tablet (25 mg total) by mouth 3 (three) times daily as needed. 75 tablet 1   lisinopril (ZESTRIL) 20 MG tablet Take 20 mg by mouth daily.     omeprazole (PRILOSEC) 40 MG capsule Take 40 mg by mouth daily.     Oxycodone HCl 10 MG TABS Take 10 mg by mouth 4 (four) times daily as needed.     oxyCODONE-acetaminophen (PERCOCET/ROXICET) 5-325 MG tablet Take 1 tablet by mouth 3 (three) times daily as needed.     SUTAB [phone] MG TABS SMARTSIG:24 Tablet(s) By Mouth     No current facility-administered  medications for this visit.    Musculoskeletal: Strength & Muscle Tone: within normal limits Gait & Station: normal Patient leans: N/A  Psychiatric Specialty Exam: Review of Systems  Psychiatric/Behavioral:  Positive for decreased concentration and sleep disturbance. Negative for dysphoric mood, hallucinations, self-injury and suicidal ideas. The patient is nervous/anxious. The patient is not hyperactive.    Blood pressure (!) 143/74, pulse 72, height 5\' 11"  (1.803 m), weight 263 lb (119.3 kg).Body mass index is 36.68 kg/m.  General Appearance: Well Groomed  Eye Contact:  Good  Speech:  Clear and Coherent and Normal Rate  Volume:  Normal  Mood:  Anxious and Depressed  Affect:  Congruent  Thought Process:  Coherent, Goal Directed, and Descriptions of Associations: Intact  Orientation:  Full (Time, Place, and Person)  Thought Content:  WDL  Suicidal Thoughts:  No  Homicidal Thoughts:  No  Memory:  Immediate;   Good Recent;   Good Remote;   Good  Judgement:  Good  Insight:  Good  Psychomotor Activity:  Normal  Concentration:  Concentration: Good and Attention Span: Good  Recall:  Good  Fund of Knowledge:Good  Language: Good  Akathisia:  NA  Handed:  Right  AIMS (if indicated):  not done  Assets:  Communication Skills Desire for Improvement Housing Social Support Transportation  ADL's:  Intact  Cognition: WNL  Sleep:  Poor   Screenings: GAD-7    Flowsheet Row [facility]  Total GAD-7 Score 21 7      PHQ2-9    Flowsheet Row [facility] Counselor [facility]  PHQ-2 Total Score 5 4 4   PHQ-9 Total Score 20 19 14       Flowsheet Row [facility]  C-SSRS RISK CATEGORY No Risk No Risk       Assessment and Plan:   Spencer Fischer is a 55 year old male with a past psychiatric history significant for depression who [facility] for medication management.  Patient is requesting refills on his current medication regimen.  Patient endorses improvement in his depression and anxiety since taking his Celexa and hydroxyzine.  Patient's medications to  be e-prescribed to pharmacy of choice.  1. Generalized anxiety disorder  - hydrOXYzine (ATARAX/VISTARIL) 25 MG tablet; Take 1 tablet (25 mg total) by mouth 3 (three) times daily as needed.  Dispense: 75 tablet; Refill: 1  2. Severe episode of recurrent major depressive disorder, without psychotic features (HCC)  - citalopram (CELEXA) 10 MG tablet; Take 1 tablet (10 mg total) by mouth daily.  Dispense: 30 tablet; Refill: 1  3. Insomnia due to other mental disorder  - traZODone (DESYREL) 100 MG tablet; Take 1 tablet (100 mg total) by mouth at bedtime.  Dispense: 30 tablet; Refill: 1  Patient to follow up in 2 months Provider spent a total of 40 minutes with the patient/reviewing patient's chart  Meta Hatchet, PA 7/14/20229:18 AM

## 2020-12-28 ENCOUNTER — Telehealth: Payer: Self-pay | Admitting: *Deleted

## 2020-12-28 NOTE — Telephone Encounter (Signed)
Patient returned called, explained that he is to come by and pick up a cam boot. He will be in sometimes today before 5:00,will ask for [facility].

## 2020-12-29 NOTE — Telephone Encounter (Signed)
Patient came to the office yesterday and got a air fracture walker boot. Spencer Fischer

## 2021-01-03 ENCOUNTER — Other Ambulatory Visit: Payer: Self-pay

## 2021-01-03 ENCOUNTER — Ambulatory Visit (INDEPENDENT_AMBULATORY_CARE_PROVIDER_SITE_OTHER): Payer: Medicare HMO | Admitting: Podiatry

## 2021-01-03 ENCOUNTER — Encounter: Payer: Self-pay | Admitting: Podiatry

## 2021-01-03 DIAGNOSIS — M79675 Pain in left toe(s): Secondary | ICD-10-CM | POA: Diagnosis not present

## 2021-01-03 DIAGNOSIS — S96912A Strain of unspecified muscle and tendon at ankle and foot level, left foot, initial encounter: Secondary | ICD-10-CM

## 2021-01-03 DIAGNOSIS — S96912D Strain of unspecified muscle and tendon at ankle and foot level, left foot, subsequent encounter: Secondary | ICD-10-CM

## 2021-01-03 DIAGNOSIS — M79674 Pain in right toe(s): Secondary | ICD-10-CM

## 2021-01-03 DIAGNOSIS — B351 Tinea unguium: Secondary | ICD-10-CM

## 2021-01-06 NOTE — Progress Notes (Signed)
Subjective: 55 year old male presents the office today for follow-up evaluation of a partial tear of Achilles tendon.  States he been wearing the cam boot which is helping but still has some discomfort.  No recent surgeries otherwise.  Asking for the nails to be trimmed elongated he cannot do them himself.  No swelling or redness to the toenail sites.  No open lesions. Denies any systemic complaints such as fevers, chills, nausea, vomiting. No acute changes since last appointment, and no other complaints at this time.   Objective: AAO x3, NAD DP/PT pulses palpable bilaterally, CRT less than 3 seconds There is mild discomfort still present on the posterior aspect calcaneus and there is some mild edema this therapy improved compared to previous appointment.  No erythema or warmth.  Thompson test is negative.  Minimal discomfort of plantar aspect calcaneus and insertion of plantar fascial.  No area pinpoint tenderness. Nails are hypertrophic, dystrophic, brittle, discolored, elongated 10. No surrounding redness or drainage. Tenderness nails 1-5 bilaterally. No open lesions or pre-ulcerative lesions are identified today. No open lesions or pre-ulcerative lesions.  No pain with calf compression, swelling, warmth, erythema  Assessment: 55 year old male with Achilles tendinitis, partial tear; symptomatic onychomycosis  Plan: -All treatment options discussed with the patient including all alternatives, risks, complications.  -Sharply debrided nails x10 without any complications or bleeding -In regards to the Achilles tendinitis, partial tear we will continue the cam boot for now.  Doing ice elevate.  Referral to physical therapy. -Patient encouraged to call the office with any questions, concerns, change in symptoms.   Vivi Barrack DPM

## 2021-01-17 ENCOUNTER — Telehealth (HOSPITAL_COMMUNITY): Payer: Self-pay

## 2021-01-17 NOTE — Telephone Encounter (Signed)
Received fax from [facility] requesting refill on patient's Hydroxyzine 25mg . Patient has enough until September. Sent in on 7/14, #75 with 1 refill. Patient takes 1 tablet po TID PRN

## 2021-01-20 ENCOUNTER — Ambulatory Visit: Payer: Medicare HMO

## 2021-01-25 ENCOUNTER — Telehealth: Payer: Self-pay | Admitting: *Deleted

## 2021-01-25 NOTE — Telephone Encounter (Signed)
Called Spencer Fischer outpatient rehab and the patient has been scheduled for Aug. 23rd @2 :45pm. The patient is aware.

## 2021-01-31 ENCOUNTER — Ambulatory Visit (HOSPITAL_COMMUNITY): Payer: Medicaid Other | Admitting: Licensed Clinical Social Worker

## 2021-02-01 ENCOUNTER — Other Ambulatory Visit: Payer: Self-pay

## 2021-02-01 ENCOUNTER — Ambulatory Visit: Payer: Medicare HMO | Attending: Podiatry

## 2021-02-01 DIAGNOSIS — R2689 Other abnormalities of gait and mobility: Secondary | ICD-10-CM | POA: Insufficient documentation

## 2021-02-01 DIAGNOSIS — M6281 Muscle weakness (generalized): Secondary | ICD-10-CM | POA: Diagnosis present

## 2021-02-01 DIAGNOSIS — M25572 Pain in left ankle and joints of left foot: Secondary | ICD-10-CM | POA: Diagnosis present

## 2021-02-01 NOTE — Therapy (Signed)
[facility] [address] [place], [place], [address] Phone: [phone]   Fax:  [phone]  Physical Therapy Evaluation  Patient Details  Name: Spencer Fischer MRN: [id number] Date of Birth: 1965-12-24 Referring Provider (PT): Vivi Barrack, [place]   Encounter Date: 02/01/2021   PT End of Session - 02/01/21 1514     Visit Number 1    Number of Visits 20    Date for PT Re-Evaluation 04/26/21    Authorization Type [employer] MCR    Progress Note Due on Visit 10    PT Start Time 1440    PT Stop Time 1513    PT Time Calculation (min) 33 min    Activity Tolerance Patient tolerated treatment well;No increased pain    Behavior During Therapy WFL for tasks assessed/performed             Past Medical History:  Diagnosis Date   Anxiety    Arthritis    Carpal tunnel syndrome    Carpal tunnel syndrome, left    Depression    Hypertension    Sleep apnea    wears CPAP    Past Surgical History:  Procedure Laterality Date   BILATERAL ANTERIOR TOTAL HIP ARTHROPLASTY Bilateral 04/06/2015   Procedure: BILATERAL ANTERIOR TOTAL HIP ARTHROPLASTY;  Surgeon: Kathryne Hitch, MD;  Location: [facility] OR;  Service: Orthopedics;  Laterality: Bilateral;   CARPAL TUNNEL RELEASE Left 08/24/2016   Procedure: LEFT OPEN CARPAL TUNNEL RELEASE;  Surgeon: Kathryne Hitch, MD;  Location: [facility] OR;  Service: Orthopedics;  Laterality: Left;   KNEE SURGERY Left    WISDOM TOOTH EXTRACTION      There were no vitals filed for this visit.    Subjective Assessment - 02/01/21 1441     Subjective Pt presents to PT with reports of chronic L posterior heel pain secondary to partial achilles tendon tear. He is in a brace suppiled by DPM, which he stated initially helped, but is now hurting to the same degree. Pt notes that walking increases increases his pain the most, especially with inclined surfaces and with L ankle DF. Pt denies n/t down L LE, has numerous  previous LE joint replacement surgeries.    Pertinent History bilat anter THAs, L TKA    Limitations Walking    How long can you sit comfortably? indefinite    How long can you stand comfortably? 10-15 min    How long can you walk comfortably? 5-10 min    Patient Stated Goals Pt would like to get back to waking recreationally again    Currently in Pain? Yes    Pain Score 8    10/10 at worst   Pain Location Ankle    Pain Orientation Posterior;Left    Pain Type Chronic pain    Pain Onset More than a month ago    Pain Frequency Constant    Aggravating Factors  walking, especially up inclined surfaces    Pain Relieving Factors rest                [facility] PT Assessment - 02/01/21 0001       Assessment   Medical Diagnosis S96.912D (ICD-10-CM) - Tendon tear, ankle, left, subsequent encounter    Referring Provider (PT) Vivi Barrack, DPM    Hand Dominance Right      Precautions   Precautions None    Required Braces or Orthoses --   brace for L ankle given by [facility]  Restrictions   Weight Bearing Restrictions No      Balance Screen   Has the patient fallen in the past 6 months No    Has the patient had a decrease in activity level because of a fear of falling?  No    Is the patient reluctant to leave their home because of a fear of falling?  No      Home Environment   Living Environment Private residence    Living Arrangements Other relatives    Type of Home House    Home Access Stairs to enter    Entrance Stairs-Number of Steps 4 STE    Entrance Stairs-Rails Can reach both    Home Layout One level      Prior Function   Level of Independence Independent;Independent with basic ADLs      Observation/Other Assessments   Other Surveys  Lower Extremity Functional Scale    Lower Extremity Functional Scale  28/80      Strength   Overall Strength Comments LE MMT 5/5 with excetions listed; L ankle DNT    Left Hip Flexion 3+/5      Special Tests   Other special tests  Thompson squeeze negative      Transfers   Five time sit to stand comments  21 sec                        Objective measurements completed on examination: See above findings.               PT Education - 02/01/21 1514     Education Details eval findings, HEP, POC    Person(s) Educated Patient    Methods Explanation;Demonstration;Handout    Comprehension Verbalized understanding;Returned demonstration              PT Short Term Goals - 02/01/21 1632       PT SHORT TERM GOAL #1   Title Pt will be compliant and knowledgeable with 90% of initial HEP in order to improve comfort and carryover    Baseline initial HEP given    Time 3    Period Weeks    Status New    Target Date 02/22/21               PT Long Term Goals - 02/01/21 1632       PT LONG TERM GOAL #1   Title Pt will improve LEFS score to no less than 50/80 in order to improve confidence and functional ability    Baseline 28/80    Time 12    Period Weeks    Status New    Target Date 04/26/21      PT LONG TERM GOAL #2   Title Pt will increase L ankle DF to at least 12 degrees not limited by pain in order to improve comfort with ambulation    Baseline DNT on eval; in brace    Time 12    Period Weeks    Status New    Target Date 04/26/21      PT LONG TERM GOAL #3   Title Pt will self report L achilles pain no greater than 3/10 at worst for improved comfort and functional ability    Baseline 10/10 at worst    Time 12    Period Weeks    Status New    Target Date 04/26/21      PT LONG TERM GOAL #4   Title  Pt will decrease 5xSTS to no greater than 10 seconds for improved balance and mobility    Baseline 21 seconds    Time 12    Period Weeks    Status New    Target Date 04/26/21                    Plan - 02/01/21 1517     Clinical Impression Statement Pt is a pleasant 55 y/o M who presents to PT with reports of chronic L heel pain d/t partial achilles tear.  Physical findings are consistent with MD impresison, as pt demonstrates abnormal gait and decreased LE strength. His 5xSTS time places him at a slight increased risk for fall, while his LEFS score indicates severe disability, with pt operating well below PLOF. Pt would benefit from skilled PT, working on improving LE strength and mobility to tolerance. Will assess response to heel wedge and progress as tolerated.    Personal Factors and Comorbidities Comorbidity 2    Comorbidities HTN; Bilat THAs    Examination-Activity Limitations Squat;Stand;Stairs;Sit;Locomotion Level    Examination-Participation Restrictions Yard Work;Community Activity    Stability/Clinical Decision Making Stable/Uncomplicated    Clinical Decision Making Low    Rehab Potential Good    PT Frequency 2x / week    PT Duration 12 weeks    PT Treatment/Interventions ADLs/Self Care Home Management;Aquatic Therapy;Electrical Stimulation;Cryotherapy;Moist Heat;Gait training;Stair training;Functional mobility training;Patient/family education;Therapeutic activities;Therapeutic exercise;Balance training;Neuromuscular re-education;Manual techniques;Dry needling;Taping;Vasopneumatic Device    PT Next Visit Plan assess respones to heel wedge on L foot; assess response to HEP; progress as able    PT Home Exercise Plan Access Code: [email or web address]    Consulted and Agree with Plan of Care Patient             Patient will benefit from skilled therapeutic intervention in order to improve the following deficits and impairments:  Abnormal gait, Decreased activity tolerance, Decreased balance, Decreased endurance, Decreased mobility, Decreased range of motion, Decreased strength, Impaired flexibility, Pain  Visit Diagnosis: Other abnormalities of gait and mobility - Plan: PT plan of care cert/re-cert  Pain in left ankle and joints of left foot - Plan: PT plan of care cert/re-cert  Muscle weakness (generalized) - Plan: PT plan of care  cert/re-cert     Problem List Patient Active Problem List   Diagnosis Date Noted   Generalized anxiety disorder 12/23/2020   Severe episode of recurrent major depressive disorder, without psychotic features (HCC) 12/23/2020   Insomnia due to other mental disorder 12/23/2020   Carpal tunnel syndrome, left upper limb 08/09/2016   Obesity 06/06/2016   OSA (obstructive sleep apnea) 02/17/2016   Obesity (BMI 30.0-34.9) 12/02/2015   Mild obstructive sleep apnea 10/01/2015   HTN (hypertension) 10/01/2015   Current smoker 10/01/2015   Left leg claudication (HCC) 10/01/2015   Elevated WBC count 10/01/2015   Chronic low back pain without sciatica 10/01/2015   Carpal tunnel syndrome 10/01/2015   Hypokalemia 04/14/2015   GERD (gastroesophageal reflux disease) 04/13/2015   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    Eloy End, PT, DPT 02/01/21 4:40 PM  [facility] [address] [place], [place], [address] Phone: [phone]   Fax:  [phone]  Name: Spencer Fischer MRN: [id number] Date of Birth: [birth year]

## 2021-02-07 ENCOUNTER — Other Ambulatory Visit: Payer: Self-pay

## 2021-02-07 ENCOUNTER — Ambulatory Visit (INDEPENDENT_AMBULATORY_CARE_PROVIDER_SITE_OTHER): Payer: Medicare HMO | Admitting: Licensed Clinical Social Worker

## 2021-02-07 DIAGNOSIS — F331 Major depressive disorder, recurrent, moderate: Secondary | ICD-10-CM | POA: Diagnosis not present

## 2021-02-09 ENCOUNTER — Ambulatory Visit: Payer: Medicare HMO | Admitting: Physical Therapy

## 2021-02-09 ENCOUNTER — Telehealth: Payer: Self-pay | Admitting: Physical Therapy

## 2021-02-09 NOTE — Telephone Encounter (Signed)
Called and informed patient of missed visit and provided reminder of next appt and attendance policy.  

## 2021-02-10 NOTE — Progress Notes (Signed)
   THERAPIST PROGRESS NOTE  Session Time: 45 min  Participation Level: Active  Behavioral Response: CasualAlertDepressed  Type of Therapy: Individual Therapy  Treatment Goals addressed: Communication: dep/coping  Interventions: Solution Focused, Supportive, and Other: additional assessment  Summary: Spencer Fischer is a 55 y.o. male who presents with hx of MDD. This date pt returns for in person session. This is first session since initial session 11/30/20. Pt advises he is continuing to live with sister and mother. He reports this is "going well" yet there are stressors with mother. Pt states she is 72 and there are some role reversal impacts with mother needing help/support. Pt states he is working on being more patient with her as he knows he is getting easily frustrated. When assessed for status of searching for his own housing pt advises he is connected with the [employer] program and working with a Sports coach on leads. Pt had med management appt in July. Pt states he is taking meds as prescribed and feels they are working "very good". Pt reports he is more calm and thinking more clearly. Sleep is improved yet pt reports he needs to f/u with past dx of sleep apnea and need for new cpap. He agrees to schedule appt r/t cpap. Addressed his desire to quit smoking. Pt reports he is down to 3 cig a day right now and feels good about reduction. He denies use of any substances. Pt reports his feelings of dep are "8" on a 0-10 scale. He endorses feelings of sadness r/t his past poor decisions and losing everything, needing to build his life back at age of 7. Remainder of session spent addressing loss/grief. LCSW provided education on stages of grief and associated feelings. Pt reports he identifies with all instruction and feels validated. LCSW provided literature on grief for pt to take with him. Pt will consider exercise in the pool at [facility]. He uses his faith/prayer to assist with  coping. LCSW reviewed poc including scheduling prior to close of session. Pt states appreciation for care.    Suicidal/Homicidal: Nowithout intent/plan  Therapist Response: Pt remains receptive to care.  Plan: Return again in ~3 weeks.  Diagnosis: Axis I:  MDD, moderate  Crane Sink, LCSW 02/10/2021

## 2021-02-11 ENCOUNTER — Ambulatory Visit: Payer: Medicare HMO | Admitting: Physical Therapy

## 2021-02-11 ENCOUNTER — Telehealth: Payer: Self-pay | Admitting: Physical Therapy

## 2021-02-11 NOTE — Telephone Encounter (Signed)
Pt states that his appts will not work with his work schedule.  He would like to cancel appts and call when he has his new schedule.  I informed him that he would only be able to schedule 1 appt at a time d/t 2 n/s in a row; he confirms understanding.

## 2021-02-15 ENCOUNTER — Ambulatory Visit: Payer: Medicare HMO | Admitting: Podiatry

## 2021-02-15 ENCOUNTER — Ambulatory Visit: Payer: Medicare HMO

## 2021-02-16 ENCOUNTER — Ambulatory Visit (HOSPITAL_COMMUNITY): Payer: Self-pay | Admitting: Licensed Clinical Social Worker

## 2021-02-17 ENCOUNTER — Ambulatory Visit: Payer: Medicare HMO

## 2021-02-21 ENCOUNTER — Ambulatory Visit (HOSPITAL_COMMUNITY): Payer: Medicaid Other | Admitting: Licensed Clinical Social Worker

## 2021-02-22 ENCOUNTER — Encounter: Payer: Medicare HMO | Admitting: Physical Therapy

## 2021-02-24 ENCOUNTER — Encounter: Payer: Medicare HMO | Admitting: Physical Therapy

## 2021-03-01 ENCOUNTER — Other Ambulatory Visit (HOSPITAL_COMMUNITY): Payer: Self-pay | Admitting: Physician Assistant

## 2021-03-01 ENCOUNTER — Telehealth (HOSPITAL_COMMUNITY): Payer: Self-pay | Admitting: *Deleted

## 2021-03-01 ENCOUNTER — Encounter (HOSPITAL_COMMUNITY): Payer: Medicare HMO | Admitting: Physician Assistant

## 2021-03-01 DIAGNOSIS — F411 Generalized anxiety disorder: Secondary | ICD-10-CM

## 2021-03-01 DIAGNOSIS — F99 Mental disorder, not otherwise specified: Secondary | ICD-10-CM

## 2021-03-01 DIAGNOSIS — F5105 Insomnia due to other mental disorder: Secondary | ICD-10-CM

## 2021-03-01 DIAGNOSIS — F332 Major depressive disorder, recurrent severe without psychotic features: Secondary | ICD-10-CM

## 2021-03-01 NOTE — Telephone Encounter (Signed)
VM left 3X for writer, he missed his appt today, called me after missing appt and said he had COVID like sx which is why he no showed. He now wants to speak with someone re his medicines. I called him back but no answer. Left a message for him to call me back. I scheduled an appt for him on 04/19/21.

## 2021-03-08 ENCOUNTER — Ambulatory Visit: Payer: Medicare HMO | Admitting: Orthopaedic Surgery

## 2021-03-18 ENCOUNTER — Ambulatory Visit (INDEPENDENT_AMBULATORY_CARE_PROVIDER_SITE_OTHER): Payer: Medicare HMO | Admitting: Orthopedic Surgery

## 2021-03-18 ENCOUNTER — Ambulatory Visit (INDEPENDENT_AMBULATORY_CARE_PROVIDER_SITE_OTHER): Payer: Medicare HMO

## 2021-03-18 ENCOUNTER — Encounter: Payer: Self-pay | Admitting: Orthopedic Surgery

## 2021-03-18 ENCOUNTER — Ambulatory Visit: Payer: Self-pay

## 2021-03-18 ENCOUNTER — Other Ambulatory Visit: Payer: Self-pay

## 2021-03-18 VITALS — BP 130/74 | HR 81 | Ht 71.0 in | Wt 261.8 lb

## 2021-03-18 DIAGNOSIS — M79641 Pain in right hand: Secondary | ICD-10-CM

## 2021-03-18 DIAGNOSIS — M79642 Pain in left hand: Secondary | ICD-10-CM

## 2021-03-18 MED ORDER — DICLOFENAC SODIUM 75 MG PO TBEC: 75.0000 mg | DELAYED_RELEASE_TABLET | Freq: Two times a day (BID) | ORAL | 0 refills | Status: AC

## 2021-03-18 NOTE — Progress Notes (Addendum)
Office Visit Note   Patient: Spencer Fischer           Date of Birth: [birth year]           MRN: [id number] Visit Date: 03/18/2021              Requested by: Salli Real, MD [address] [place],  [place] [address] PCP: Salli Real, MD   Assessment & Plan: Visit Diagnoses:  1. Pain in right hand   2. Pain in left hand     Plan: We discussed the diagnosis, prognosis, non-operative and operative treatment options for carpal tunnel syndrome.  After our discussion, the patient would like to proceed with bracing and a prescription for Diclofenac. We reviewed the risks and benefits of conservative management.  The patient expressed understanding of the reasoning and strategy going forward.  He is not ready to do an EMG/NCS but will call when he's ready to schedule the electrodiagnostic testing.   All patient questions and concerns were addressed.    Follow-Up Instructions: No follow-ups on file.   Orders:  Orders Placed This Encounter  Procedures   XR Hand Complete Left   XR Hand Complete Right   Meds ordered this encounter  Medications   diclofenac (VOLTAREN) 75 MG EC tablet    Sig: Take 1 tablet (75 mg total) by mouth 2 (two) times daily for 14 days.    Dispense:  28 tablet    Refill:  0      Procedures: No procedures performed   Clinical Data: No additional findings.   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  He describes numbness and paresthesias in the thumb, index, and middle fingers.  This wakes him up at night and requires him to shake his hands for symptom relief.  This happens every night.  He had similar symptoms on the L which resolved after CTR some years ago.  He has no problems with the L side.  He also notes diminished grip strength on the R side w/ some clumsiness.  He denies any radicular symptoms.    Review of Systems  Constitutional: Negative.   Respiratory: Negative.    Cardiovascular: Negative.   Skin: Negative.   Neurological:  Positive for numbness.    Objective: Vital Signs: BP 130/74 (BP Location: Left Arm, Patient Position: Sitting, Cuff Size: Normal)   Pulse 81   Ht 5\' 11"  (1.803 m)   Wt 261 lb 12.8 oz (118.8 kg)   SpO2 96%   BMI 36.51 kg/m   Physical Exam Constitutional:      Appearance: Normal appearance.  Cardiovascular:     Rate and Rhythm: Normal rate.     Pulses: Normal pulses.  Pulmonary:     Effort: Pulmonary effort is normal.  Skin:    General: Skin is warm and dry.     Capillary Refill: Capillary refill takes less than 2 seconds.  Neurological:     Mental Status: He is alert.    Right Hand Exam   Tenderness  The patient is experiencing no tenderness.   Range of Motion  The patient has normal right wrist ROM.   Muscle Strength  The patient has normal right wrist strength.  Other  Sensation: normal Pulse: present  Comments:  +  Tinel at wrist.  + CT compression at 15s with symptoms into thumb and middle finger.  Able to make complete fist.  5/5 thenar motor strength with no atrophy.      Specialty Comments:  No specialty comments available.  Imaging: 3V or the R hand taken today are reviewed and interpreted by me.  They demonstrate mild degenerative changes at the radiocarpal joint.  There is no evidence of acute bony injury or instability.    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   Carpal tunnel syndrome, left upper limb 08/09/2016   Obesity 06/06/2016   OSA (obstructive sleep apnea) 02/17/2016   Obesity (BMI 30.0-34.9) 12/02/2015   Mild obstructive sleep apnea 10/01/2015    HTN (hypertension) 10/01/2015   Current smoker 10/01/2015   Left leg claudication (HCC) 10/01/2015   Elevated WBC count 10/01/2015   Chronic low back pain without sciatica 10/01/2015   Carpal tunnel syndrome 10/01/2015   Hypokalemia 04/14/2015   GERD (gastroesophageal reflux disease) 04/13/2015   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   Past Medical History:  Diagnosis Date   Anxiety    Arthritis    Carpal tunnel syndrome    Carpal tunnel syndrome, left    Depression    Hypertension    Sleep apnea    wears CPAP    Family History  Problem Relation Age of Onset   Hypertension Mother    Hypertension Sister    Hypertension Brother     Past Surgical History:  Procedure Laterality Date   BILATERAL ANTERIOR TOTAL HIP ARTHROPLASTY Bilateral 04/06/2015   Procedure: BILATERAL ANTERIOR TOTAL HIP ARTHROPLASTY;  Surgeon: Kathryne Hitch, MD;  Location: [facility] OR;  Service: Orthopedics;  Laterality: Bilateral;   CARPAL TUNNEL RELEASE Left 08/24/2016   Procedure: LEFT OPEN CARPAL TUNNEL RELEASE;  Surgeon: Kathryne Hitch, MD;  Location: [facility] OR;  Service: Orthopedics;  Laterality: Left;   KNEE SURGERY Left    WISDOM TOOTH EXTRACTION     Social History   Occupational History   Not on file  Tobacco Use   Smoking status: Every Day    Packs/day: 0.25    Types: Cigarettes   Smokeless tobacco: Never  Substance and Sexual Activity   Alcohol use: Yes    Comment: 4- 40 ounces of beer/week   Drug use: No   Sexual activity: Not on file

## 2021-03-28 ENCOUNTER — Other Ambulatory Visit (HOSPITAL_COMMUNITY): Payer: Self-pay | Admitting: Physician Assistant

## 2021-03-28 DIAGNOSIS — F411 Generalized anxiety disorder: Secondary | ICD-10-CM

## 2021-03-28 DIAGNOSIS — F332 Major depressive disorder, recurrent severe without psychotic features: Secondary | ICD-10-CM

## 2021-03-28 DIAGNOSIS — F99 Mental disorder, not otherwise specified: Secondary | ICD-10-CM

## 2021-03-28 DIAGNOSIS — F5105 Insomnia due to other mental disorder: Secondary | ICD-10-CM

## 2021-04-05 ENCOUNTER — Telehealth: Payer: Self-pay | Admitting: Orthopedic Surgery

## 2021-04-05 NOTE — Telephone Encounter (Signed)
Pt called requesting a referral for a nerve test. Pt states the medication is not working and the dr recommended a nerve test. Please call pt at [phone].

## 2021-04-05 NOTE — Addendum Note (Signed)
Addended by: Marlyne Beards on: 04/05/2021 01:03 PM   Modules accepted: Orders

## 2021-04-07 ENCOUNTER — Telehealth: Payer: Self-pay | Admitting: Orthopedic Surgery

## 2021-04-07 ENCOUNTER — Other Ambulatory Visit: Payer: Self-pay | Admitting: Orthopedic Surgery

## 2021-04-07 MED ORDER — DICLOFENAC SODIUM 75 MG PO TBEC: 75.0000 mg | DELAYED_RELEASE_TABLET | Freq: Two times a day (BID) | ORAL | 0 refills | Status: AC

## 2021-04-07 NOTE — Telephone Encounter (Signed)
Patient called needing Rx refilled Diclofenac. Patient asked when will he have the NCS. The number to contact patient is  [phone]

## 2021-04-07 NOTE — Telephone Encounter (Unsigned)
Patient has EMG scheduled for 04/15/2021. He is requesting medication refills on voltaren

## 2021-04-13 ENCOUNTER — Ambulatory Visit (INDEPENDENT_AMBULATORY_CARE_PROVIDER_SITE_OTHER): Payer: Medicare HMO | Admitting: Licensed Clinical Social Worker

## 2021-04-13 ENCOUNTER — Other Ambulatory Visit: Payer: Self-pay

## 2021-04-13 DIAGNOSIS — F3341 Major depressive disorder, recurrent, in partial remission: Secondary | ICD-10-CM | POA: Diagnosis not present

## 2021-04-14 NOTE — Progress Notes (Signed)
   THERAPIST PROGRESS NOTE  Session Time: 25  Participation Level: Active  Behavioral Response: CasualAlertEuthymic  Type of Therapy: Individual Therapy  Treatment Goals addressed: Communication: dep/coping  Interventions: Solution Focused, Supportive, and Other: additional assessment  Summary: DIONEL ARCHEY is a 55 y.o. male who presents with hx of MDD.  Today patient returns for in person session.  Last seen August 29.  Patient arrives at 10:08 for appointment which clinician addressed with patient regarding timeliness of start of session.  Patient verbalizes understanding.  Patient is in an uplifted mood saying he has been doing well since last session.  He reports he has had some increased caregiving with his mother due to a surgery she had.  Patient reports he is coping adequately with this role at this time.  He continues to live with his mother and sister.  Assessment of his transitional housing program reveals patient saying he has a voucher with no expiration but cannot find a landlord willing to take the voucher thus far.  LCSW provided referral to [facility] for housing and community studies for additional support.  LCSW assessed for patient's reading of grief literature provided last session.  Patient states that he did not read the literature.  Patient further reports he does not want to go backward and wishes to move forward.  Patient reports he is taking meds as prescribed.  He states he is sleeping better.  Patient states his depression on a scale of 0-10 with 10 being the worst is "5".  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  Assessment of exercise at [facility] reveals patient continues to go to the [facility] for swimming at least twice a week and often takes his nephew on the weekend.  Patient denies new worries or concerns.  He remains clean and sober with no current concern of relapse Mellody Dance relies heavily on his  faith for coping. LCSW reviewed poc including scheduling prior to close of session. Pt states appreciation for care.   Suicidal/Homicidal: Nowithout intent/plan  Therapist Response: Pt receptive to care.  Plan: Return again for next avail appt.  Diagnosis: Axis I:  MDD, partial remission  Randlett Sink, LCSW 04/14/2021

## 2021-04-15 ENCOUNTER — Ambulatory Visit (INDEPENDENT_AMBULATORY_CARE_PROVIDER_SITE_OTHER): Payer: Medicare HMO | Admitting: Physician Assistant

## 2021-04-15 ENCOUNTER — Other Ambulatory Visit: Payer: Self-pay

## 2021-04-15 ENCOUNTER — Encounter (HOSPITAL_COMMUNITY): Payer: Self-pay | Admitting: Physician Assistant

## 2021-04-15 ENCOUNTER — Ambulatory Visit (INDEPENDENT_AMBULATORY_CARE_PROVIDER_SITE_OTHER): Payer: Medicare HMO | Admitting: Physical Medicine and Rehabilitation

## 2021-04-15 ENCOUNTER — Encounter: Payer: Self-pay | Admitting: Physical Medicine and Rehabilitation

## 2021-04-15 DIAGNOSIS — F332 Major depressive disorder, recurrent severe without psychotic features: Secondary | ICD-10-CM

## 2021-04-15 DIAGNOSIS — F5105 Insomnia due to other mental disorder: Secondary | ICD-10-CM

## 2021-04-15 DIAGNOSIS — F99 Mental disorder, not otherwise specified: Secondary | ICD-10-CM

## 2021-04-15 DIAGNOSIS — R202 Paresthesia of skin: Secondary | ICD-10-CM | POA: Diagnosis not present

## 2021-04-15 DIAGNOSIS — F411 Generalized anxiety disorder: Secondary | ICD-10-CM

## 2021-04-15 MED ORDER — HYDROXYZINE HCL 25 MG PO TABS: 25.0000 mg | ORAL_TABLET | Freq: Three times a day (TID) | ORAL | 1 refills | Status: DC | PRN

## 2021-04-15 MED ORDER — CITALOPRAM HYDROBROMIDE 10 MG PO TABS: 10.0000 mg | ORAL_TABLET | Freq: Every day | ORAL | 1 refills | Status: DC

## 2021-04-15 MED ORDER — TRAZODONE HCL 100 MG PO TABS: 100.0000 mg | ORAL_TABLET | Freq: Every day | ORAL | 1 refills | Status: DC

## 2021-04-15 NOTE — Progress Notes (Signed)
BH MD/PA/NP OP Progress Note  04/15/2021 10:23 AM Spencer Fischer  MRN:  [id number]  Chief Complaint:  Chief Complaint   Medication Management    HPI:   Spencer Fischer is a 55 year old male with a past psychiatric history significant for generalized anxiety disorder, major depressive disorder, and insomnia who presents to [facility] for follow-up and medication management.  Patient is currently being managed on the following medications:  Hydroxyzine 25 mg 3 times daily as needed Citalopram (Lexapro) 10 mg daily Trazodone 100 mg at bedtime  Patient reports that he has been doing well on his medications.  Patient denies the need for dosage adjustments at this time and is requesting refills of all his medications following the completion of the encounter.  Patient endorses few depressive episodes as of late.  Patient states that whenever he encounters situations he tries to make it into a good 1.  Patient does not work elevated anxiety which he rates a 9 out of 10.  Patient states that his anxiety stems from trying to do a lot of things in his life but not being where he needs to be.  He states that things are not moving in his favor as he would like.  One of his main concern relates to getting housing.  Patient reports that he constantly feels the need to always be doing something.  A GAD-7 screen was performed with the patient scoring a 13.  Patient is alert and oriented x4, calm, cooperative, and fully engaged in conversation during the encounter.  Patient reports being in a good mood.  Patient denies suicidal or homicidal ideations.  He further denies auditory or visual hallucinations and does not appear to be responding to internal/external stimuli.  Patient endorses poor sleep and receives on average 3 to 4 hours of intermittent sleep.  Patient states that the trazodone medication that he is taking has been working well for him but his sleep is interrupted  by his carpal tunnel syndrome.  Patient endorses good appetite and eats on average 2 meals per day.  Patient notes that he has lost roughly 5 to 6 pounds due to his eating habits.  Patient denies alcohol consumption and illicit drug use.  Patient endorses tobacco use stating that he smokes whenever he feels the need.  Visit Diagnosis:    ICD-10-CM   1. Generalized anxiety disorder  F41.1 hydrOXYzine (ATARAX/VISTARIL) 25 MG tablet    2. Severe episode of recurrent major depressive disorder, without psychotic features (HCC)  F33.2 citalopram (CELEXA) 10 MG tablet    3. Insomnia due to other mental disorder  F51.05 traZODone (DESYREL) 100 MG tablet   F99       Past Psychiatric History:  Generalized anxiety disorder Major depressive disorder  Past Medical History:  Past Medical History:  Diagnosis Date   Anxiety    Arthritis    Carpal tunnel syndrome    Carpal tunnel syndrome, left    Depression    Hypertension    Sleep apnea    wears CPAP    Past Surgical History:  Procedure Laterality Date   BILATERAL ANTERIOR TOTAL HIP ARTHROPLASTY Bilateral 04/06/2015   Procedure: BILATERAL ANTERIOR TOTAL HIP ARTHROPLASTY;  Surgeon: Kathryne Hitch, MD;  Location: [facility] OR;  Service: Orthopedics;  Laterality: Bilateral;   CARPAL TUNNEL RELEASE Left 08/24/2016   Procedure: LEFT OPEN CARPAL TUNNEL RELEASE;  Surgeon: Kathryne Hitch, MD;  Location: [facility] OR;  Service: Orthopedics;  Laterality: Left;  KNEE SURGERY Left    WISDOM TOOTH EXTRACTION      Family Psychiatric History:  Patient denies a family history of psychiatric illness  Family History:  Family History  Problem Relation Age of Onset   Hypertension Mother    Hypertension Sister    Hypertension Brother     Social History:  Social History   Socioeconomic History   Marital status: Single    Spouse name: Not on file   Number of children: Not on file   Years of education: Not on file   Highest education level: Not  on file  Occupational History   Not on file  Tobacco Use   Smoking status: Every Day    Packs/day: 0.25    Types: Cigarettes   Smokeless tobacco: Never  Substance and Sexual Activity   Alcohol use: Yes    Comment: 4- 40 ounces of beer/week   Drug use: No   Sexual activity: Not on file  Other Topics Concern   Not on file  Social History Narrative   Not on file   Social Determinants of Health   Financial Resource Strain: Not on file  Food Insecurity: Not on file  Transportation Needs: Not on file  Physical Activity: Not on file  Stress: Not on file  Social Connections: Not on file    Allergies: No Known Allergies  Metabolic Disorder Labs: Lab Results  Component Value Date   HGBA1C 5.67f 10/01/2015   No results found for: PROLACTIN No results found for: CHOL, TRIG, HDL, CHOLHDL, VLDL, LDLCALC Lab Results  Component Value Date   TSH 3.307 04/16/2015    Therapeutic Level Labs: No results found for: LITHIUM No results found for: VALPROATE No components found for:  CBMZ  Current Medications: Current Outpatient Medications  Medication Sig Dispense Refill   baclofen (LIORESAL) 20 MG tablet Take 20 mg by mouth 3 (three) times daily as needed.     diclofenac (VOLTAREN) 75 MG EC tablet Take 1 tablet (75 mg total) by mouth 2 (two) times daily for 14 days. 28 tablet 0   lisinopril (ZESTRIL) 20 MG tablet Take 20 mg by mouth daily.     omeprazole (PRILOSEC) 40 MG capsule Take 40 mg by mouth daily.     Oxycodone HCl 10 MG TABS Take 10 mg by mouth 4 (four) times daily as needed.     oxyCODONE-acetaminophen (PERCOCET/ROXICET) 5-325 MG tablet Take 1 tablet by mouth 3 (three) times daily as needed.     SUTAB [phone] MG TABS SMARTSIG:24 Tablet(s) By Mouth     citalopram (CELEXA) 10 MG tablet Take 1 tablet (10 mg total) by mouth daily. 30 tablet 1   hydrOXYzine (ATARAX/VISTARIL) 25 MG tablet Take 1 tablet (25 mg total) by mouth 3 (three) times daily as needed. 75 tablet 1    traZODone (DESYREL) 100 MG tablet Take 1 tablet (100 mg total) by mouth at bedtime. 30 tablet 1   No current facility-administered medications for this visit.     Musculoskeletal: Strength & Muscle Tone: within normal limits Gait & Station: normal Patient leans: N/A  Psychiatric Specialty Exam: Review of Systems  Psychiatric/Behavioral:  Positive for sleep disturbance. Negative for decreased concentration, dysphoric mood, hallucinations, self-injury and suicidal ideas. The patient is nervous/anxious. The patient is not hyperactive.    Blood pressure 133/82, pulse 73, height 5\' 11"  (1.803 m), weight 260 lb (117.9 kg).Body mass index is 36.26 kg/m.  General Appearance: Well Groomed  Eye Contact:  Good  Speech:  Clear and Coherent and Normal Rate  Volume:  Normal  Mood:  Anxious and Euthymic  Affect:  Appropriate and Congruent  Thought Process:  Coherent and Descriptions of Associations: Intact  Orientation:  Full (Time, Place, and Person)  Thought Content: WDL   Suicidal Thoughts:  No  Homicidal Thoughts:  No  Memory:  Immediate;   Good Recent;   Good Remote;   Good  Judgement:  Good  Insight:  Fair  Psychomotor Activity:  Normal  Concentration:  Concentration: Good and Attention Span: Good  Recall:  Good  Fund of Knowledge: Good  Language: Good  Akathisia:  NA  Handed:  Right  AIMS (if indicated): not done  Assets:  Communication Skills Desire for Improvement Housing Social Support Transportation  ADL's:  Intact  Cognition: WNL  Sleep:  Fair   Screenings: GAD-7    Flowsheet Row Office Visit from 04/15/2021 in [facility] Office Visit from 12/23/2020 in [facility] Office Visit from 10/01/2015 in [facility]  Total GAD-7 Score 13 21 7       PHQ2-9    Flowsheet Row Office Visit from 04/15/2021 in [facility] Office Visit from 12/23/2020 in [facility] Counselor from 11/30/2020 in [facility] Office Visit from 10/01/2015 in [facility]  PHQ-2 Total Score 0 5 4 4   PHQ-9 Total Score -- 20 19 14       Flowsheet Row Office Visit from 04/15/2021 in [facility] Office Visit from 12/23/2020 in [facility] Counselor from 11/30/2020 in [facility]  C-SSRS RISK CATEGORY No Risk No Risk No Risk        Assessment and Plan:   Sonny Anthes. Greenough is a 55 year old male with a past psychiatric history significant for generalized anxiety disorder, major depressive disorder, and insomnia who presents to [facility] for follow-up and medication management.  Patient reports that his depressive episodes have been much more manageable since taking his medications but he has been experiencing elevated anxiety stemming from stressors in his life.  Patient denies a need for dosage adjustments at this time and is requesting refills on all his medications following the completion of the encounter.  Patient's medications to be e-prescribed to pharmacy of choice.  1. Generalized anxiety disorder  - hydrOXYzine (ATARAX/VISTARIL) 25 MG tablet; Take 1 tablet (25 mg total) by mouth 3 (three) times daily as needed.  Dispense: 75 tablet; Refill: 1  2. Severe episode of recurrent major depressive disorder, without psychotic features (HCC)  - citalopram (CELEXA) 10 MG tablet; Take 1 tablet (10 mg total) by mouth daily.  Dispense: 30 tablet; Refill: 1  3. Insomnia due to other mental disorder  - traZODone (DESYREL) 100 MG tablet; Take 1 tablet (100 mg total) by mouth at bedtime.  Dispense: 30 tablet; Refill: 1  Patient to follow up in 2 months Provider spent a total of 17 minutes with the patient/reviewing the patient's chart  Anna Genre, PA 04/15/2021, 10:23  AM

## 2021-04-15 NOTE — Progress Notes (Signed)
Pain in right hand. Keeps him awake at night. Numbness and tingling. Difficulty gripping things. First three fingers are worse. Right hand dominant No lotion per patient

## 2021-04-16 ENCOUNTER — Encounter (HOSPITAL_COMMUNITY): Payer: Self-pay | Admitting: Physician Assistant

## 2021-04-18 NOTE — Progress Notes (Signed)
Spencer Fischer - 55 y.o. male MRN [id number]  Date of birth: 11-Oct-1965  Office Visit Note: Visit Date: 04/15/2021 PCP: Salli Real, MD Referred by: Salli Real, MD  Subjective: Chief Complaint  Patient presents with   Right Hand - Numbness, Pain   HPI:  Spencer Fischer is a 55 y.o. male who comes in today at the request of Dr. Waylan Rocher for electrodiagnostic study of the Right upper extremities.  Patient is Right hand dominant.  He reports pain numbness and tingling in the right hand particularly in the radial 3 digits.  He reports this feels a lot like when he had carpal tunnel syndrome on the left.  He is status post left carpal tunnel release.  He denies any specific radicular complaints.  He does get nocturnal symptoms.     ROS Otherwise per HPI.  Assessment & Plan: Visit Diagnoses:    ICD-10-CM   1. 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   There is no significant electrodiagnostic evidence of any other focal nerve entrapment, brachial plexopathy or cervical radiculopathy.   Recommendations: 1.  Follow-up with referring physician. 2.  Continue current management of symptoms. 3.  Continue use of resting splint at night-time and as needed during the day. 4.  Suggest surgical evaluation.  Meds & Orders: No orders of the defined types were placed in this encounter.   Orders Placed This Encounter  Procedures   NCV with EMG (electromyography)    Follow-up: Return in about 2 weeks (around 04/29/2021) for Waylan Rocher, MD.   Procedures: No procedures performed  EMG & NCV Findings: Evaluation of the right median motor nerve showed prolonged distal onset latency (5.9 ms) and decreased conduction velocity (Elbow-Wrist, 45 m/s).  The right  ulnar motor and the right ulnar sensory nerves showed reduced amplitude (R2.1, R8.8 V).  The right median (across palm) sensory nerve showed no response (Wrist) and no response (Palm).  All remaining nerves (as indicated in the following tables) were within normal limits.    All examined muscles (as indicated in the following table) showed no evidence of electrical instability.    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   There is no significant electrodiagnostic evidence of any other focal nerve entrapment, brachial plexopathy or cervical radiculopathy.   Recommendations: 1.  Follow-up with referring physician. 2.  Continue current management of symptoms. 3.  Continue use of resting splint at night-time and as needed during the day. 4.  Suggest surgical evaluation.  ___________________________ Naaman Plummer FAAPMR Board Certified, American Board of Physical Medicine and Rehabilitation    Nerve Conduction Studies Anti Sensory Summary Table   Stim Site NR Peak (ms) Norm Peak (ms) P-T Amp (V) Norm P-T Amp Site1 Site2 Delta-P (ms) Dist (cm) Vel (m/s) Norm Vel (m/s)  Right Median Acr Palm Anti Sensory (2nd Digit)  31.1C  Wrist *NR  <3.6  >10 Wrist Palm  0.0    Palm *NR  <2.0          Right Radial Anti Sensory (Base 1st Digit)  31.2C  Wrist    2.3 <3.1 20.1  Wrist Base 1st  Digit 2.3 0.0    Right Ulnar Anti Sensory (5th Digit)  31.3C  Wrist    3.4 <3.7 *8.8 >15.0 Wrist 5th Digit 3.4 14.0 41 >38   Motor Summary Table   Stim Site NR Onset (ms) Norm Onset (ms) O-P Amp (mV) Norm O-P Amp Site1 Site2 Delta-0 (ms) Dist (cm) Vel (m/s) Norm Vel (m/s)  Right Median Motor (Abd Poll Brev)  31.4C  Wrist    *5.9 <4.2 7.3 >5 Elbow Wrist 5.0 22.5 *45 >50  Elbow    10.9  7.1         Right Ulnar Motor (Abd Dig Min)  32.1C   Wrist    3.2 <4.2 *2.1 >3 B Elbow Wrist 3.5 23.0 66 >53  B Elbow    6.7  5.8  A Elbow B Elbow 1.2 10.0 83 >53  A Elbow    7.9  6.5          EMG   Side Muscle Nerve Root Ins Act Fibs Psw Amp Dur Poly Recrt Int Dennie Bible Comment  Right Abd Poll Brev Median C8-T1 Nml Nml Nml Nml Nml 0 Nml Nml   Right 1stDorInt Ulnar C8-T1 Nml Nml Nml Nml Nml 0 Nml Nml   Right PronatorTeres Median C6-7 Nml Nml Nml Nml Nml 0 Nml Nml   Right Biceps Musculocut C5-6 Nml Nml Nml Nml Nml 0 Nml Nml   Right Deltoid Axillary C5-6 Nml Nml Nml Nml Nml 0 Nml Nml     Nerve Conduction Studies Anti Sensory Left/Right Comparison   Stim Site L Lat (ms) R Lat (ms) L-R Lat (ms) L Amp (V) R Amp (V) L-R Amp (%) Site1 Site2 L Vel (m/s) R Vel (m/s) L-R Vel (m/s)  Median Acr Palm Anti Sensory (2nd Digit)  31.1C  Wrist       Wrist Palm     Palm             Radial Anti Sensory (Base 1st Digit)  31.2C  Wrist  2.3   20.1  Wrist Base 1st Digit     Ulnar Anti Sensory (5th Digit)  31.3C  Wrist  3.4   *8.8  Wrist 5th Digit  41    Motor Left/Right Comparison   Stim Site L Lat (ms) R Lat (ms) L-R Lat (ms) L Amp (mV) R Amp (mV) L-R Amp (%) Site1 Site2 L Vel (m/s) R Vel (m/s) L-R Vel (m/s)  Median Motor (Abd Poll Brev)  31.4C  Wrist  *5.9   7.3  Elbow Wrist  *45   Elbow  10.9   7.1        Ulnar Motor (Abd Dig Min)  32.1C  Wrist  3.2   *2.1  B Elbow Wrist  66   B Elbow  6.7   5.8  A Elbow B Elbow  83   A Elbow  7.9   6.5           Waveforms:            Clinical History: Cervical Spine MRI 06/10/2016  IMPRESSION: Mild motion degraded examination.   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   Moderate LEFT C4-5 and C5-6 neural foraminal narrowing.     Objective:  VS:  HT:    WT:   BMI:     BP:   HR: bpm  TEMP: ( )  RESP:  Physical Exam Musculoskeletal:        General: No tenderness.     Comments: Inspection reveals  no atrophy of the bilateral APB or FDI or hand intrinsics. There is no swelling,  color changes, allodynia or dystrophic changes. There is 5 out of 5 strength in the bilateral wrist extension, finger abduction and long finger flexion. There is intact sensation to light touch in all dermatomal and peripheral nerve distributions.  There is a negative Phalen's test on the right. There is a negative Hoffmann's test bilaterally.  Skin:    General: Skin is warm and dry.     Findings: No erythema or rash.  Neurological:     General: No focal deficit present.     Mental Status: He is alert and oriented to person, place, and time.     Sensory: No sensory deficit.     Motor: No weakness or abnormal muscle tone.     Coordination: Coordination normal.     Gait: Gait normal.  Psychiatric:        Mood and Affect: Mood normal.        Behavior: Behavior normal.        Thought Content: Thought content normal.     Imaging: No results found.

## 2021-04-18 NOTE — Procedures (Signed)
EMG & NCV Findings: Evaluation of the right median motor nerve showed prolonged distal onset latency (5.9 ms) and decreased conduction velocity (Elbow-Wrist, 45 m/s).  The right ulnar motor and the right ulnar sensory nerves showed reduced amplitude (R2.1, R8.8 V).  The right median (across palm) sensory nerve showed no response (Wrist) and no response (Palm).  All remaining nerves (as indicated in the following tables) were within normal limits.    All examined muscles (as indicated in the following table) showed no evidence of electrical instability.    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   There is no significant electrodiagnostic evidence of any other focal nerve entrapment, brachial plexopathy or cervical radiculopathy.   Recommendations: 1.  Follow-up with referring physician. 2.  Continue current management of symptoms. 3.  Continue use of resting splint at night-time and as needed during the day. 4.  Suggest surgical evaluation.  ___________________________ Naaman Plummer FAAPMR Board Certified, American Board of Physical Medicine and Rehabilitation    Nerve Conduction Studies Anti Sensory Summary Table   Stim Site NR Peak (ms) Norm Peak (ms) P-T Amp (V) Norm P-T Amp Site1 Site2 Delta-P (ms) Dist (cm) Vel (m/s) Norm Vel (m/s)  Right Median Acr Palm Anti Sensory (2nd Digit)  31.1C  Wrist *NR  <3.6  >10 Wrist Palm  0.0    Palm *NR  <2.0          Right Radial Anti Sensory (Base 1st Digit)  31.2C  Wrist    2.3 <3.1 20.1  Wrist Base 1st Digit 2.3 0.0    Right Ulnar Anti Sensory (5th Digit)  31.3C  Wrist    3.4 <3.7 *8.8 >15.0 Wrist 5th Digit 3.4 14.0 41 >38   Motor Summary Table   Stim Site NR Onset (ms) Norm Onset (ms) O-P Amp (mV) Norm O-P Amp Site1 Site2 Delta-0 (ms) Dist (cm) Vel (m/s) Norm Vel (m/s)   Right Median Motor (Abd Poll Brev)  31.4C  Wrist    *5.9 <4.2 7.3 >5 Elbow Wrist 5.0 22.5 *45 >50  Elbow    10.9  7.1         Right Ulnar Motor (Abd Dig Min)  32.1C  Wrist    3.2 <4.2 *2.1 >3 B Elbow Wrist 3.5 23.0 66 >53  B Elbow    6.7  5.8  A Elbow B Elbow 1.2 10.0 83 >53  A Elbow    7.9  6.5          EMG   Side Muscle Nerve Root Ins Act Fibs Psw Amp Dur Poly Recrt Int Dennie Bible Comment  Right Abd Poll Brev Median C8-T1 Nml Nml Nml Nml Nml 0 Nml Nml   Right 1stDorInt Ulnar C8-T1 Nml Nml Nml Nml Nml 0 Nml Nml   Right PronatorTeres Median C6-7 Nml Nml Nml Nml Nml 0 Nml Nml   Right Biceps Musculocut C5-6 Nml Nml Nml Nml Nml 0 Nml Nml   Right Deltoid Axillary C5-6 Nml Nml Nml Nml Nml 0 Nml Nml     Nerve Conduction Studies Anti Sensory Left/Right Comparison   Stim Site L Lat (ms) R Lat (ms) L-R Lat (ms) L Amp (V) R Amp (V) L-R Amp (%) Site1 Site2 L Vel (m/s) R Vel (m/s) L-R Vel (m/s)  Median Acr Palm Anti Sensory (2nd Digit)  31.1C  Wrist  Wrist Palm     Palm             Radial Anti Sensory (Base 1st Digit)  31.2C  Wrist  2.3   20.1  Wrist Base 1st Digit     Ulnar Anti Sensory (5th Digit)  31.3C  Wrist  3.4   *8.8  Wrist 5th Digit  41    Motor Left/Right Comparison   Stim Site L Lat (ms) R Lat (ms) L-R Lat (ms) L Amp (mV) R Amp (mV) L-R Amp (%) Site1 Site2 L Vel (m/s) R Vel (m/s) L-R Vel (m/s)  Median Motor (Abd Poll Brev)  31.4C  Wrist  *5.9   7.3  Elbow Wrist  *45   Elbow  10.9   7.1        Ulnar Motor (Abd Dig Min)  32.1C  Wrist  3.2   *2.1  B Elbow Wrist  66   B Elbow  6.7   5.8  A Elbow B Elbow  83   A Elbow  7.9   6.5           Waveforms:

## 2021-04-19 ENCOUNTER — Encounter (HOSPITAL_COMMUNITY): Payer: Medicare HMO | Admitting: Physician Assistant

## 2021-04-26 ENCOUNTER — Other Ambulatory Visit: Payer: Self-pay

## 2021-04-26 ENCOUNTER — Ambulatory Visit (INDEPENDENT_AMBULATORY_CARE_PROVIDER_SITE_OTHER): Payer: Medicare HMO | Admitting: Orthopedic Surgery

## 2021-04-26 DIAGNOSIS — G5601 Carpal tunnel syndrome, right upper limb: Secondary | ICD-10-CM | POA: Diagnosis not present

## 2021-04-26 NOTE — H&P (View-Only) (Signed)
Office Visit Note   Patient: Spencer Fischer           Date of Birth: Sep 10, 1965           MRN: [id number] Visit Date: 04/26/2021              Requested by: Salli Real, MD [address] [place],  [place] [address] PCP: Salli Real, MD   Assessment & Plan: Visit Diagnoses:  1. Carpal tunnel syndrome of right wrist     Plan: He recent underwent electrodiagnostic testing when was consistent with moderate to severe right carpal tunnel syndrome. We discussed the diagnosis, prognosis, and both conservative and operative treatment options for carpal tunnel syndrome.  After our discussion, the patient has elected to proceed with carpal tunnel release.  We reviewed the benefits of surgery and the potential risks including, but not limited to, persistent symptoms, infection, damage to nearby nerves and blood vessels, delayed wound healing.    All patient concerns and questions were addressed.  A surgical date will be confirmed with the patient.    Follow-Up Instructions: No follow-ups on file.   Orders:  No orders of the defined types were placed in this encounter.  No orders of the defined types were placed in this encounter.     Procedures: No procedures performed   Clinical Data: No additional findings.   Subjective: Chief Complaint  Patient presents with   Right Hand - Follow-up    EMG/NCS Review    This is a 55 yo RHD M who presents for follow up of right hand numbness and tingling in the thumb, index, and middle fingers.  He recently underwent EMG/NCS which demonstrated moderate to severe R CTS.  He is still having the same symptoms including waking up at night.  He has failed conservative management.  He is interested in surgery.    Review of Systems   Objective: Vital Signs: There were no vitals taken for this visit.  Physical Exam Constitutional:      Appearance: Normal appearance.  Cardiovascular:     Rate and Rhythm: Normal rate.     Pulses: Normal pulses.   Pulmonary:     Effort: Pulmonary effort is normal.  Skin:    General: Skin is warm and dry.     Capillary Refill: Capillary refill takes less than 2 seconds.  Neurological:     Mental Status: He is alert.    Right Hand Exam   Tenderness  The patient is experiencing no tenderness.   Range of Motion  The patient has normal right wrist ROM.   Other  Erythema: absent Sensation: normal Pulse: present  Comments:  + Tinel, CT compression, and Phalen's tests at wrist.      Specialty Comments:  No specialty comments available.  Imaging: No results found.   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   Carpal tunnel syndrome, left upper limb 08/09/2016   Obesity 06/06/2016   OSA (obstructive sleep apnea) 02/17/2016   Obesity (BMI 30.0-34.9) 12/02/2015   Mild obstructive sleep apnea 10/01/2015   HTN (hypertension) 10/01/2015   Current smoker 10/01/2015   Left leg claudication (HCC) 10/01/2015   Elevated WBC count 10/01/2015   Chronic low back pain without sciatica 10/01/2015   Carpal tunnel syndrome 10/01/2015   Hypokalemia 04/14/2015  GERD (gastroesophageal reflux disease) 04/13/2015   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   Past Medical History:  Diagnosis Date   Anxiety    Arthritis    Carpal tunnel syndrome    Carpal tunnel syndrome, left    Depression    Hypertension    Sleep apnea    wears CPAP    Family History  Problem Relation Age of Onset   Hypertension Mother    Hypertension Sister    Hypertension Brother     Past Surgical History:  Procedure Laterality Date   BILATERAL ANTERIOR TOTAL HIP ARTHROPLASTY Bilateral 04/06/2015   Procedure: BILATERAL ANTERIOR TOTAL HIP ARTHROPLASTY;  Surgeon: Kathryne Hitch, MD;   Location: [facility] OR;  Service: Orthopedics;  Laterality: Bilateral;   CARPAL TUNNEL RELEASE Left 08/24/2016   Procedure: LEFT OPEN CARPAL TUNNEL RELEASE;  Surgeon: Kathryne Hitch, MD;  Location: [facility] OR;  Service: Orthopedics;  Laterality: Left;   KNEE SURGERY Left    WISDOM TOOTH EXTRACTION     Social History   Occupational History   Not on file  Tobacco Use   Smoking status: Every Day    Packs/day: 0.25    Types: Cigarettes   Smokeless tobacco: Never  Substance and Sexual Activity   Alcohol use: Yes    Comment: 4- 40 ounces of beer/week   Drug use: No   Sexual activity: Not on file

## 2021-04-26 NOTE — Progress Notes (Signed)
Office Visit Note   Patient: Spencer Fischer           Date of Birth: Sep 10, 1965           MRN: [id number] Visit Date: 04/26/2021              Requested by: Salli Real, MD [address] [place],  [place] [address] PCP: Salli Real, MD   Assessment & Plan: Visit Diagnoses:  1. Carpal tunnel syndrome of right wrist     Plan: He recent underwent electrodiagnostic testing when was consistent with moderate to severe right carpal tunnel syndrome. We discussed the diagnosis, prognosis, and both conservative and operative treatment options for carpal tunnel syndrome.  After our discussion, the patient has elected to proceed with carpal tunnel release.  We reviewed the benefits of surgery and the potential risks including, but not limited to, persistent symptoms, infection, damage to nearby nerves and blood vessels, delayed wound healing.    All patient concerns and questions were addressed.  A surgical date will be confirmed with the patient.    Follow-Up Instructions: No follow-ups on file.   Orders:  No orders of the defined types were placed in this encounter.  No orders of the defined types were placed in this encounter.     Procedures: No procedures performed   Clinical Data: No additional findings.   Subjective: Chief Complaint  Patient presents with   Right Hand - Follow-up    EMG/NCS Review    This is a 55 yo RHD M who presents for follow up of right hand numbness and tingling in the thumb, index, and middle fingers.  He recently underwent EMG/NCS which demonstrated moderate to severe R CTS.  He is still having the same symptoms including waking up at night.  He has failed conservative management.  He is interested in surgery.    Review of Systems   Objective: Vital Signs: There were no vitals taken for this visit.  Physical Exam Constitutional:      Appearance: Normal appearance.  Cardiovascular:     Rate and Rhythm: Normal rate.     Pulses: Normal pulses.   Pulmonary:     Effort: Pulmonary effort is normal.  Skin:    General: Skin is warm and dry.     Capillary Refill: Capillary refill takes less than 2 seconds.  Neurological:     Mental Status: He is alert.    Right Hand Exam   Tenderness  The patient is experiencing no tenderness.   Range of Motion  The patient has normal right wrist ROM.   Other  Erythema: absent Sensation: normal Pulse: present  Comments:  + Tinel, CT compression, and Phalen's tests at wrist.      Specialty Comments:  No specialty comments available.  Imaging: No results found.   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   Carpal tunnel syndrome, left upper limb 08/09/2016   Obesity 06/06/2016   OSA (obstructive sleep apnea) 02/17/2016   Obesity (BMI 30.0-34.9) 12/02/2015   Mild obstructive sleep apnea 10/01/2015   HTN (hypertension) 10/01/2015   Current smoker 10/01/2015   Left leg claudication (HCC) 10/01/2015   Elevated WBC count 10/01/2015   Chronic low back pain without sciatica 10/01/2015   Carpal tunnel syndrome 10/01/2015   Hypokalemia 04/14/2015  GERD (gastroesophageal reflux disease) 04/13/2015   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   Past Medical History:  Diagnosis Date   Anxiety    Arthritis    Carpal tunnel syndrome    Carpal tunnel syndrome, left    Depression    Hypertension    Sleep apnea    wears CPAP    Family History  Problem Relation Age of Onset   Hypertension Mother    Hypertension Sister    Hypertension Brother     Past Surgical History:  Procedure Laterality Date   BILATERAL ANTERIOR TOTAL HIP ARTHROPLASTY Bilateral 04/06/2015   Procedure: BILATERAL ANTERIOR TOTAL HIP ARTHROPLASTY;  Surgeon: Kathryne Hitch, MD;   Location: [facility] OR;  Service: Orthopedics;  Laterality: Bilateral;   CARPAL TUNNEL RELEASE Left 08/24/2016   Procedure: LEFT OPEN CARPAL TUNNEL RELEASE;  Surgeon: Kathryne Hitch, MD;  Location: [facility] OR;  Service: Orthopedics;  Laterality: Left;   KNEE SURGERY Left    WISDOM TOOTH EXTRACTION     Social History   Occupational History   Not on file  Tobacco Use   Smoking status: Every Day    Packs/day: 0.25    Types: Cigarettes   Smokeless tobacco: Never  Substance and Sexual Activity   Alcohol use: Yes    Comment: 4- 40 ounces of beer/week   Drug use: No   Sexual activity: Not on file

## 2021-05-02 ENCOUNTER — Telehealth: Payer: Self-pay

## 2021-05-02 NOTE — Telephone Encounter (Signed)
Pt called and would like a call back regarding a surgery date.  

## 2021-05-04 ENCOUNTER — Encounter (HOSPITAL_BASED_OUTPATIENT_CLINIC_OR_DEPARTMENT_OTHER)
Admission: RE | Admit: 2021-05-04 | Discharge: 2021-05-04 | Disposition: A | Discharge status: Home / Self Care | Payer: Medicare HMO | Source: Ambulatory Visit | Attending: Orthopedic Surgery | Admitting: Orthopedic Surgery

## 2021-05-04 ENCOUNTER — Other Ambulatory Visit (HOSPITAL_BASED_OUTPATIENT_CLINIC_OR_DEPARTMENT_OTHER): Payer: Medicare HMO

## 2021-05-04 ENCOUNTER — Encounter (HOSPITAL_BASED_OUTPATIENT_CLINIC_OR_DEPARTMENT_OTHER): Payer: Self-pay | Admitting: Orthopedic Surgery

## 2021-05-04 ENCOUNTER — Other Ambulatory Visit: Payer: Self-pay

## 2021-05-04 DIAGNOSIS — Z01812 Encounter for preprocedural laboratory examination: Secondary | ICD-10-CM | POA: Insufficient documentation

## 2021-05-11 ENCOUNTER — Ambulatory Visit (HOSPITAL_BASED_OUTPATIENT_CLINIC_OR_DEPARTMENT_OTHER)
Admission: RE | Admit: 2021-05-11 | Discharge: 2021-05-11 | Disposition: A | Discharge status: Home / Self Care | Payer: Medicare HMO | Source: Ambulatory Visit | Attending: Orthopedic Surgery | Admitting: Orthopedic Surgery

## 2021-05-11 ENCOUNTER — Other Ambulatory Visit: Payer: Self-pay

## 2021-05-11 ENCOUNTER — Ambulatory Visit (HOSPITAL_BASED_OUTPATIENT_CLINIC_OR_DEPARTMENT_OTHER): Payer: Medicare HMO | Admitting: Anesthesiology

## 2021-05-11 ENCOUNTER — Encounter (HOSPITAL_BASED_OUTPATIENT_CLINIC_OR_DEPARTMENT_OTHER): Payer: Self-pay | Admitting: Orthopedic Surgery

## 2021-05-11 ENCOUNTER — Other Ambulatory Visit: Payer: Self-pay | Admitting: Radiology

## 2021-05-11 ENCOUNTER — Encounter (HOSPITAL_BASED_OUTPATIENT_CLINIC_OR_DEPARTMENT_OTHER)
Admission: RE | Disposition: A | Discharge status: Home / Self Care | Payer: Self-pay | Source: Ambulatory Visit | Attending: Orthopedic Surgery

## 2021-05-11 DIAGNOSIS — K219 Gastro-esophageal reflux disease without esophagitis: Secondary | ICD-10-CM | POA: Diagnosis not present

## 2021-05-11 DIAGNOSIS — I10 Essential (primary) hypertension: Secondary | ICD-10-CM | POA: Diagnosis not present

## 2021-05-11 DIAGNOSIS — Z6836 Body mass index (BMI) 36.0-36.9, adult: Secondary | ICD-10-CM | POA: Diagnosis not present

## 2021-05-11 DIAGNOSIS — J449 Chronic obstructive pulmonary disease, unspecified: Secondary | ICD-10-CM | POA: Insufficient documentation

## 2021-05-11 DIAGNOSIS — F17210 Nicotine dependence, cigarettes, uncomplicated: Secondary | ICD-10-CM | POA: Diagnosis not present

## 2021-05-11 DIAGNOSIS — G5601 Carpal tunnel syndrome, right upper limb: Secondary | ICD-10-CM | POA: Diagnosis not present

## 2021-05-11 DIAGNOSIS — G4733 Obstructive sleep apnea (adult) (pediatric): Secondary | ICD-10-CM | POA: Diagnosis not present

## 2021-05-11 HISTORY — DX: Gastro-esophageal reflux disease without esophagitis: K21.9

## 2021-05-11 HISTORY — PX: CARPAL TUNNEL RELEASE: SHX101

## 2021-05-11 SURGERY — CARPAL TUNNEL RELEASE
Anesthesia: General | Site: Wrist | Laterality: Right

## 2021-05-11 MED ORDER — LIDOCAINE HCL (PF) 1 % IJ SOLN
INTRAMUSCULAR | Status: AC
  Filled 2021-05-11: qty 30

## 2021-05-11 MED ORDER — DEXAMETHASONE SODIUM PHOSPHATE 4 MG/ML IJ SOLN
INTRAMUSCULAR | Status: DC | PRN
  Administered 2021-05-11: 10 mg via INTRAVENOUS

## 2021-05-11 MED ORDER — ONDANSETRON HCL 4 MG/2ML IJ SOLN
INTRAMUSCULAR | Status: AC
  Filled 2021-05-11: qty 2

## 2021-05-11 MED ORDER — FENTANYL CITRATE (PF) 100 MCG/2ML IJ SOLN
INTRAMUSCULAR | Status: DC | PRN
  Administered 2021-05-11 (×4): 50 ug via INTRAVENOUS

## 2021-05-11 MED ORDER — OXYCODONE HCL 5 MG PO TABS: 5.0000 mg | ORAL_TABLET | Freq: Once | ORAL | Status: DC | PRN

## 2021-05-11 MED ORDER — ACETAMINOPHEN 500 MG PO TABS
1000.0000 mg | ORAL_TABLET | Freq: Once | ORAL | Status: AC
  Administered 2021-05-11: 1000 mg via ORAL

## 2021-05-11 MED ORDER — MEPERIDINE HCL 25 MG/ML IJ SOLN: 6.2500 mg | INTRAMUSCULAR | Status: DC | PRN

## 2021-05-11 MED ORDER — DEXAMETHASONE SODIUM PHOSPHATE 10 MG/ML IJ SOLN
INTRAMUSCULAR | Status: AC
  Filled 2021-05-11: qty 1

## 2021-05-11 MED ORDER — FENTANYL CITRATE (PF) 100 MCG/2ML IJ SOLN
INTRAMUSCULAR | Status: AC
  Filled 2021-05-11: qty 2

## 2021-05-11 MED ORDER — PROMETHAZINE HCL 25 MG/ML IJ SOLN: 6.2500 mg | INTRAMUSCULAR | Status: DC | PRN

## 2021-05-11 MED ORDER — MIDAZOLAM HCL 5 MG/5ML IJ SOLN
INTRAMUSCULAR | Status: DC | PRN
  Administered 2021-05-11: 2 mg via INTRAVENOUS

## 2021-05-11 MED ORDER — OXYCODONE HCL 5 MG/5ML PO SOLN: 5.0000 mg | Freq: Once | ORAL | Status: DC | PRN

## 2021-05-11 MED ORDER — ACETAMINOPHEN 500 MG PO TABS
ORAL_TABLET | ORAL | Status: AC
  Filled 2021-05-11: qty 2

## 2021-05-11 MED ORDER — ONDANSETRON HCL 4 MG/2ML IJ SOLN
INTRAMUSCULAR | Status: DC | PRN
  Administered 2021-05-11: 4 mg via INTRAVENOUS

## 2021-05-11 MED ORDER — HYDROMORPHONE HCL 1 MG/ML IJ SOLN: 0.2500 mg | INTRAMUSCULAR | Status: DC | PRN

## 2021-05-11 MED ORDER — BUPIVACAINE HCL (PF) 0.25 % IJ SOLN
INTRAMUSCULAR | Status: DC | PRN
  Administered 2021-05-11: 5 mL

## 2021-05-11 MED ORDER — DICLOFENAC SODIUM 75 MG PO TBEC: 75.0000 mg | DELAYED_RELEASE_TABLET | Freq: Two times a day (BID) | ORAL | 0 refills | Status: DC

## 2021-05-11 MED ORDER — MIDAZOLAM HCL 2 MG/2ML IJ SOLN: 0.5000 mg | Freq: Once | INTRAMUSCULAR | Status: DC | PRN

## 2021-05-11 MED ORDER — LACTATED RINGERS IV SOLN: INTRAVENOUS | Status: DC

## 2021-05-11 MED ORDER — 0.9 % SODIUM CHLORIDE (POUR BTL) OPTIME
TOPICAL | Status: DC | PRN
  Administered 2021-05-11: 150 mL

## 2021-05-11 MED ORDER — OXYCODONE HCL 5 MG PO TABS: 5.0000 mg | ORAL_TABLET | Freq: Four times a day (QID) | ORAL | 0 refills | Status: AC | PRN

## 2021-05-11 MED ORDER — PROPOFOL 500 MG/50ML IV EMUL
INTRAVENOUS | Status: AC
  Filled 2021-05-11: qty 50

## 2021-05-11 MED ORDER — LIDOCAINE 2% (20 MG/ML) 5 ML SYRINGE
INTRAMUSCULAR | Status: DC | PRN
  Administered 2021-05-11: 20 mg via INTRAVENOUS

## 2021-05-11 MED ORDER — PROPOFOL 10 MG/ML IV BOLUS
INTRAVENOUS | Status: DC | PRN
  Administered 2021-05-11: 100 mg via INTRAVENOUS
  Administered 2021-05-11: 200 mg via INTRAVENOUS

## 2021-05-11 MED ORDER — MIDAZOLAM HCL 2 MG/2ML IJ SOLN
INTRAMUSCULAR | Status: AC
  Filled 2021-05-11: qty 2

## 2021-05-11 MED ORDER — LIDOCAINE 2% (20 MG/ML) 5 ML SYRINGE
INTRAMUSCULAR | Status: AC
  Filled 2021-05-11: qty 5

## 2021-05-11 SURGICAL SUPPLY — 44 items
APL PRP STRL LF DISP 70% ISPRP (MISCELLANEOUS) ×1
BLADE SURG 15 STRL LF DISP TIS (BLADE) ×1 IMPLANT
BLADE SURG 15 STRL SS (BLADE) ×4
BNDG CMPR 9X4 STRL LF SNTH (GAUZE/BANDAGES/DRESSINGS) ×1
BNDG ELASTIC 3X5.8 VLCR STR LF (GAUZE/BANDAGES/DRESSINGS) ×2 IMPLANT
BNDG ESMARK 4X9 LF (GAUZE/BANDAGES/DRESSINGS) ×2 IMPLANT
BNDG GAUZE ELAST 4 BULKY (GAUZE/BANDAGES/DRESSINGS) ×2 IMPLANT
BNDG PLASTER X FAST 3X3 WHT LF (CAST SUPPLIES) IMPLANT
BNDG PLSTR 9X3 FST ST WHT (CAST SUPPLIES)
CHLORAPREP W/TINT 26 (MISCELLANEOUS) ×2 IMPLANT
CORD BIPOLAR FORCEPS 12FT (ELECTRODE) ×2 IMPLANT
COVER BACK TABLE 60X90IN (DRAPES) ×2 IMPLANT
COVER MAYO STAND STRL (DRAPES) ×2 IMPLANT
CUFF TOURN SGL QUICK 18X4 (TOURNIQUET CUFF) IMPLANT
CUFF TOURN SGL QUICK 24 (TOURNIQUET CUFF)
CUFF TRNQT CYL 24X4X16.5-23 (TOURNIQUET CUFF) IMPLANT
DRAPE EXTREMITY T 121X128X90 (DISPOSABLE) ×2 IMPLANT
DRAPE SURG 17X23 STRL (DRAPES) ×1 IMPLANT
DRAPE U-SHAPE 47X51 STRL (DRAPES) ×1 IMPLANT
GAUZE SPONGE 4X4 12PLY STRL (GAUZE/BANDAGES/DRESSINGS) ×2 IMPLANT
GAUZE XEROFORM 1X8 LF (GAUZE/BANDAGES/DRESSINGS) IMPLANT
GLOVE SURG ENC MOIS LTX SZ6.5 (GLOVE) ×1 IMPLANT
GLOVE SURG ENC MOIS LTX SZ7 (GLOVE) ×3 IMPLANT
GLOVE SURG UNDER POLY LF SZ7 (GLOVE) ×4 IMPLANT
GOWN STRL REUS W/ TWL LRG LVL3 (GOWN DISPOSABLE) ×1 IMPLANT
GOWN STRL REUS W/TWL LRG LVL3 (GOWN DISPOSABLE) ×2
GOWN STRL REUS W/TWL XL LVL3 (GOWN DISPOSABLE) ×2 IMPLANT
NDL HYPO 25X1 1.5 SAFETY (NEEDLE) IMPLANT
NEEDLE HYPO 25X1 1.5 SAFETY (NEEDLE) ×2 IMPLANT
NS IRRIG 1000ML POUR BTL (IV SOLUTION) ×2 IMPLANT
PACK BASIN DAY SURGERY FS (CUSTOM PROCEDURE TRAY) ×2 IMPLANT
PAD CAST 3X4 CTTN HI CHSV (CAST SUPPLIES) ×1 IMPLANT
PADDING CAST COTTON 3X4 STRL (CAST SUPPLIES) ×2
SLEEVE SCD COMPRESS KNEE MED (STOCKING) IMPLANT
SUCTION FRAZIER HANDLE 12FR (TUBING)
SUCTION TUBE FRAZIER 12FR DISP (TUBING) IMPLANT
SUT ETHILON 4 0 PS 2 18 (SUTURE) ×2 IMPLANT
SUT MNCRL AB 3-0 PS2 18 (SUTURE) IMPLANT
SUT VICRYL 4-0 PS2 18IN ABS (SUTURE) IMPLANT
SYR BULB EAR ULCER 3OZ GRN STR (SYRINGE) ×2 IMPLANT
SYR CONTROL 10ML LL (SYRINGE) ×1 IMPLANT
TOWEL GREEN STERILE FF (TOWEL DISPOSABLE) ×4 IMPLANT
TUBE CONNECTING 20X1/4 (TUBING) IMPLANT
UNDERPAD 30X36 HEAVY ABSORB (UNDERPADS AND DIAPERS) ×2 IMPLANT

## 2021-05-11 NOTE — Anesthesia Postprocedure Evaluation (Addendum)
Anesthesia Post Note  Patient: Spencer Fischer  Procedure(s) Performed: RIGHT CARPAL TUNNEL RELEASE (Right: Wrist)     Patient location during evaluation: PACU Anesthesia Type: General Level of consciousness: awake and alert, patient cooperative and oriented Pain management: pain level controlled Vital Signs Assessment: post-procedure vital signs reviewed and stable Respiratory status: spontaneous breathing, nonlabored ventilation and respiratory function stable Cardiovascular status: blood pressure returned to baseline and stable Postop Assessment: no apparent nausea or vomiting and adequate PO intake Anesthetic complications: no   No notable events documented.  Last Vitals:  Vitals:   05/11/21 1200 05/11/21 1212  BP: (!) 151/95 (!) 146/93  Pulse: 76 76  Resp: 20 20  Temp:  36.4 C  SpO2: 96% 94%    Last Pain:  Vitals:   05/11/21 1212  TempSrc: Oral  PainSc: 6                  Elen Acero,E. Nakyla Bracco

## 2021-05-11 NOTE — Interval H&P Note (Signed)
History and Physical Interval Note:  05/11/2021 9:54 AM  Spencer Fischer  has presented today for surgery, with the diagnosis of Right Carpal Tunnel Syndrome.  The various methods of treatment have been discussed with the patient and family. After consideration of risks, benefits and other options for treatment, the patient has consented to  Procedure(s): RIGHT CARPAL TUNNEL RELEASE (Right) as a surgical intervention.  The patient's history has been reviewed, patient examined, no change in status, stable for surgery.  I have reviewed the patient's chart and labs.  Questions were answered to the patient's satisfaction.      [name]

## 2021-05-11 NOTE — Transfer of Care (Signed)
Immediate Anesthesia Transfer of Care Note  Patient: Spencer Fischer  Procedure(s) Performed: RIGHT CARPAL TUNNEL RELEASE (Right: Wrist)  Patient Location: PACU  Anesthesia Type:General  Level of Consciousness: drowsy  Airway & Oxygen Therapy: Patient Spontanous Breathing and Patient connected to face mask oxygen  Post-op Assessment: Report given to RN and Post -op Vital signs reviewed and stable  Post vital signs: Reviewed and stable  Last Vitals:  Vitals Value Taken Time  BP    Temp    Pulse 80 05/11/21 1133  Resp 19 05/11/21 1133  SpO2 96 % 05/11/21 1133  Vitals shown include unvalidated device data.  Last Pain:  Vitals:   05/11/21 0849  TempSrc: Oral  PainSc: 8          Complications: No notable events documented.

## 2021-05-11 NOTE — Anesthesia Preprocedure Evaluation (Addendum)
Anesthesia Evaluation  Patient identified by MRN, date of birth, ID band Patient awake    Reviewed: Allergy & Precautions, NPO status , Patient's Chart, lab work & pertinent test results  History of Anesthesia Complications Negative for: history of anesthetic complications  Airway Mallampati: II  TM Distance: >3 FB Neck ROM: Full    Dental  (+) Missing, Dental Advisory Given   Pulmonary sleep apnea (does not have CPAP machine anymore) , COPD, Current Smoker and Patient abstained from smoking.,    breath sounds clear to auscultation       Cardiovascular hypertension, Pt. on medications (-) angina Rhythm:Regular Rate:Normal     Neuro/Psych negative neurological ROS     GI/Hepatic Neg liver ROS, GERD  Medicated and Controlled,  Endo/Other  Morbid obesity  Renal/GU negative Renal ROS     Musculoskeletal   Abdominal (+) + obese,   Peds  Hematology negative hematology ROS (+)   Anesthesia Other Findings   Reproductive/Obstetrics                            Anesthesia Physical Anesthesia Plan  ASA: 3  Anesthesia Plan: General   Post-op Pain Management: Minimal or no pain anticipated and Tylenol PO (pre-op)   Induction: Intravenous  PONV Risk Score and Plan: 1 and Ondansetron and Dexamethasone  Airway Management Planned: LMA  Additional Equipment: None  Intra-op Plan:   Post-operative Plan:   Informed Consent: I have reviewed the patients History and Physical, chart, labs and discussed the procedure including the risks, benefits and alternatives for the proposed anesthesia with the patient or authorized representative who has indicated his/her understanding and acceptance.     Dental advisory given  Plan Discussed with: CRNA and Surgeon  Anesthesia Plan Comments:        Anesthesia Quick Evaluation

## 2021-05-11 NOTE — Brief Op Note (Signed)
05/11/2021  11:25 AM  PATIENT:  Spencer Fischer  55 y.o. male  PRE-OPERATIVE DIAGNOSIS:  Right Carpal Tunnel Syndrome  POST-OPERATIVE DIAGNOSIS:  Right Carpal Tunnel Syndrome  PROCEDURE:  Procedure(s): RIGHT CARPAL TUNNEL RELEASE (Right)  SURGEON:  Surgeon(s) and Role:    * Marlyne Beards, MD - Primary  PHYSICIAN ASSISTANT:   ASSISTANTS: none   ANESTHESIA:   local and general  EBL:  2 mL   BLOOD ADMINISTERED:none  DRAINS: none   LOCAL MEDICATIONS USED:  MARCAINE     SPECIMEN:  No Specimen  DISPOSITION OF SPECIMEN:  N/A  COUNTS:  YES  TOURNIQUET:   Total Tourniquet Time Documented: Forearm (Right) - 9 minutes Total: Forearm (Right) - 9 minutes   DICTATION: .Dragon Dictation  PLAN OF CARE: Discharge to home after PACU  PATIENT DISPOSITION:  PACU - hemodynamically stable.   Delay start of Pharmacological VTE agent (>24hrs) due to surgical blood loss or risk of bleeding: not applicable

## 2021-05-11 NOTE — Anesthesia Procedure Notes (Signed)
Procedure Name: LMA Insertion Date/Time: 05/11/2021 10:45 AM Performed by: Alford Highland, CRNA Pre-anesthesia Checklist: Patient identified, Emergency Drugs available, Suction available and Patient being monitored Patient Re-evaluated:Patient Re-evaluated prior to induction Oxygen Delivery Method: Circle System Utilized Preoxygenation: Pre-oxygenation with 100% oxygen Induction Type: IV induction Ventilation: Mask ventilation without difficulty LMA: LMA inserted LMA Size: 5.0 Number of attempts: 1 Airway Equipment and Method: Bite block Placement Confirmation: positive ETCO2 Tube secured with: Tape Dental Injury: Teeth and Oropharynx as per pre-operative assessment

## 2021-05-11 NOTE — Op Note (Signed)
   Date of Surgery: 05/11/2021  INDICATIONS: Spencer Fischer is a 55 y.o.-year-old male with right carpal tunnel syndrome that has failed conservative management.  Risks, benefits, and alternatives to surgery were again discussed with the patient wishing to proceed with surgery.  Informed consent was signed after our discussion.   PREOPERATIVE DIAGNOSIS: 1. Right carpal tunnel syndrome  POSTOPERATIVE DIAGNOSIS: Same.  PROCEDURE: 1. Right carpal tunnel release   SURGEON: Audria Nine, M.D.  ASSIST:   ANESTHESIA:  general  IV FLUIDS AND URINE: See anesthesia.  ESTIMATED BLOOD LOSS: 5 mL.  IMPLANTS: * No implants in log *   DRAINS: None  COMPLICATIONS: see description of procedure.  DESCRIPTION OF PROCEDURE: The patient was met in the preoperative holding area where the surgical site was marked and the consent form was verified.  The patient was then taken to the operating room and transferred to the operating table.  All bony prominences were well padded.  A tourniquet was applied to the right forearm.  The operative extremity was prepped and draped in the usual and sterile fashion.  A formal time-out was performed to confirm that this was the correct patient, surgery, side, and site.   Following timeout, the limb was exsanguinated and the tourniquet inflated to 250 mmHg.  A longitudinal incision was made in line with the radial border of the ring finger from distal to the wrist flexion crease to the intersection of Kaplan's cardinal line.  The skin and subcutaneous tissue was sharply divided.  The longitudinally running palmar fascia was incised.  The thenar musculature was bluntly swept off of the transverse carpal ligament.  The ligament was divided from proximal to distal until the fat surrounding the palmar arch was encountered.  A retractor was then placed in the proximal aspect of the wound to visualize the distal antebrachial fascia.  The fascia was sharply divided under direct  visualization.   The wound was then thoroughly irrigated with sterile saline.  The tourniquet was deflated.  Hemostasis was achieved with direct pressure and bipolar electrocautery.  The wound was then closed with 4-0 nylon sutures in a horizontal mattress fashion. The wound was then dressed with xeroform, folded kerlix, and an ace wrap.  The patient was then reversed from anesthesia and transferred to the postoperative bed.  All counts were correct x 2 at the end of the procedure.  The patient was taken to the recovery unit in stable condition.    Audria Nine, MD 11:26 AM

## 2021-05-11 NOTE — Discharge Instructions (Addendum)
Spencer Fischer, M.D. Hand Surgery  POST-OPERATIVE DISCHARGE INSTRUCTIONS   PRESCRIPTIONS: You have been given a prescription to be taken as directed for post-operative pain control.  You may also take over the counter ibuprofen/aleve and tylenol for pain. Take this as directed on the packaging. Do not exceed 3000 mg tylenol/acetaminophen in 24 hours.  Ibuprofen 600-800 mg (3-4) tablets by mouth every 6 hours as needed for pain.  OR Aleve 2 tablets by mouth every 12 hours (twice daily) as needed for pain.  AND/OR Tylenol 1000 mg (2 tablets) every 8 hours as needed for pain.  Please use your pain medication carefully, as refills are limited and you may not be provided with one.  As stated above, please use over the counter pain medicine - it will also be helpful with decreasing your swelling.    ANESTHESIA: After your surgery, post-surgical discomfort or pain is likely. This discomfort can last several days to a few weeks. At certain times of the day your discomfort may be more intense.   Did you receive a nerve block?  A nerve block can provide pain relief for one hour to two days after your surgery. As long as the nerve block is working, you will experience little or no sensation in the area the surgeon operated on.  As the nerve block wears off, you will begin to experience pain or discomfort. It is very important that you begin taking your prescribed pain medication before the nerve block fully wears off. Treating your pain at the first sign of the block wearing off will ensure your pain is better controlled and more tolerable when full-sensation returns. Do not wait until the pain is intolerable, as the medicine will be less effective. It is better to treat pain in advance than to try and catch up.   General Anesthesia:  If you did not receive a nerve block during your surgery, you will need to start taking your pain medication shortly after your surgery and should continue to  do so as prescribed by your surgeon.     ICE AND ELEVATION: You may use ice for the first 48-72 hours, but it is not critical.   Motion of your fingers is very important s to decrease the swelling.  Elevation, as much as possible for the next 48 hours, is critical for decreasing swelling as well as for pain relief. Elevation means when you are seated or lying down, you hand should be at or above your heart. When walking, the hand needs to be at or above the level of your elbow.  If the bandage gets too tight, it may need to be loosened. Please contact our office and we will instruct you in how to do this.    SURGICAL BANDAGES:  Keep your dressing and/or splint clean and dry at all times.  You can remove your dressing 4 days from now and change with a dry dressing or Band-Aids as needed thereafter. You may place a plastic bag over your bandage to shower, but be careful, do not get your bandages wet.  After the bandages have been removed, it is OK to get the stitches wet in a shower or with hand washing. Do Not soak or submerge the wound yet. Please do not use lotions or creams on the stitches.      HAND THERAPY:  You may not need any. If you do, we will begin this at your follow up visit in the clinic.  ACTIVITY AND WORK: You are encouraged to move any fingers which are not in the bandage.  Light use of the fingers is allowed to assist the other hand with daily hygiene and eating, but strong gripping or lifting is often uncomfortable and should be avoided.  You might miss a variable period of time from work and hopefully this issue has been discussed prior to surgery. You may not do any heavy work with your affected hand for about 2 weeks.    [facility] [address] [place],  [place]  [address] [phone]    Post Anesthesia Home Care Instructions  Activity: Get plenty of rest for the remainder of the day. A responsible individual must stay with you for 24  hours following the procedure.  For the next 24 hours, DO NOT: -Drive a car -Advertising copywriter -Drink alcoholic beverages -Take any medication unless instructed by your physician -Make any legal decisions or sign important papers.  Meals: Start with liquid foods such as gelatin or soup. Progress to regular foods as tolerated. Avoid greasy, spicy, heavy foods. If nausea and/or vomiting occur, drink only clear liquids until the nausea and/or vomiting subsides. Call your physician if vomiting continues.  Special Instructions/Symptoms: Your throat may feel dry or sore from the anesthesia or the breathing tube placed in your throat during surgery. If this causes discomfort, gargle with warm salt water. The discomfort should disappear within 24 hours.  If you had a scopolamine patch placed behind your ear for the management of post- operative nausea and/or vomiting:  1. The medication in the patch is effective for 72 hours, after which it should be removed.  Wrap patch in a tissue and discard in the trash. Wash hands thoroughly with soap and water. 2. You may remove the patch earlier than 72 hours if you experience unpleasant side effects which may include dry mouth, dizziness or visual disturbances. 3. Avoid touching the patch. Wash your hands with soap and water after contact with the patch.     May have Tylenol at 3pm today 05/11/21

## 2021-05-12 ENCOUNTER — Encounter (HOSPITAL_BASED_OUTPATIENT_CLINIC_OR_DEPARTMENT_OTHER): Payer: Self-pay | Admitting: Orthopedic Surgery

## 2021-05-27 ENCOUNTER — Ambulatory Visit (INDEPENDENT_AMBULATORY_CARE_PROVIDER_SITE_OTHER): Payer: Medicare HMO | Admitting: Orthopedic Surgery

## 2021-05-27 ENCOUNTER — Other Ambulatory Visit: Payer: Self-pay

## 2021-05-27 DIAGNOSIS — G5601 Carpal tunnel syndrome, right upper limb: Secondary | ICD-10-CM

## 2021-05-27 NOTE — Progress Notes (Signed)
° °  Post-Op Visit Note   Patient: Spencer Fischer           Date of Birth: 08-31-1965           MRN: [id number] Visit Date: 05/27/2021 PCP: Salli Real, MD   Assessment & Plan:  Chief Complaint:  Chief Complaint  Patient presents with   Right Hand - Routine Post Op   Visit Diagnoses:  1. Carpal tunnel syndrome of right wrist     Plan: Patient is doing well postoperatively.  The incision is well healed without surrounding erythema or induration.  His nocturnal symptoms have completely resolved.  He has no complaints today and is pleased with his outcome.  We discussed scar massage for desensitization and scar softening.  He can see me back again as needed.   Follow-Up Instructions: No follow-ups on file.   Orders:  No orders of the defined types were placed in this encounter.  No orders of the defined types were placed in this encounter.   Imaging: No results found.  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   Carpal tunnel syndrome, left upper limb 08/09/2016   Obesity 06/06/2016   OSA (obstructive sleep apnea) 02/17/2016   Obesity (BMI 30.0-34.9) 12/02/2015   Mild obstructive sleep apnea 10/01/2015   HTN (hypertension) 10/01/2015   Current smoker 10/01/2015   Left leg claudication (HCC) 10/01/2015   Elevated WBC count 10/01/2015   Chronic low back pain without sciatica 10/01/2015   Carpal tunnel syndrome 10/01/2015   Hypokalemia 04/14/2015   GERD (gastroesophageal reflux disease) 04/13/2015   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   Past Medical History:  Diagnosis Date   Anxiety    Arthritis    Carpal tunnel syndrome    Carpal tunnel syndrome, left    Depression    GERD (gastroesophageal reflux disease)    Hypertension    Sleep apnea     wears CPAP    Family History  Problem Relation Age of Onset   Hypertension Mother    Hypertension Sister    Hypertension Brother     Past Surgical History:  Procedure Laterality Date   BILATERAL ANTERIOR TOTAL HIP ARTHROPLASTY Bilateral 04/06/2015   Procedure: BILATERAL ANTERIOR TOTAL HIP ARTHROPLASTY;  Surgeon: Kathryne Hitch, MD;  Location: [facility] OR;  Service: Orthopedics;  Laterality: Bilateral;   CARPAL TUNNEL RELEASE Left 08/24/2016   Procedure: LEFT OPEN CARPAL TUNNEL RELEASE;  Surgeon: Kathryne Hitch, MD;  Location[facility];  Service: Orthopedics;  Laterality: Left;   CARPAL TUNNEL RELEASE Right 05/11/2021   Procedure: RIGHT CARPAL TUNNEL RELEASE;  Surgeon: Marlyne Beards, MD;  Location: [facility];  Service: Orthopedics;  Laterality: Right;   KNEE SURGERY Left    WISDOM TOOTH EXTRACTION     Social History   Occupational History   Not on file  Tobacco Use   Smoking status: Every Day    Packs/day: 0.25    Types: Cigarettes   Smokeless tobacco: Never  Substance and Sexual Activity   Alcohol use: Not Currently    Comment: 4- 40 ounces of beer/week   Drug use: No   Sexual activity: Not on file

## 2021-06-09 ENCOUNTER — Other Ambulatory Visit (HOSPITAL_COMMUNITY): Payer: Self-pay | Admitting: Physician Assistant

## 2021-06-09 DIAGNOSIS — F99 Mental disorder, not otherwise specified: Secondary | ICD-10-CM

## 2021-06-09 DIAGNOSIS — F5105 Insomnia due to other mental disorder: Secondary | ICD-10-CM

## 2021-06-21 ENCOUNTER — Telehealth (INDEPENDENT_AMBULATORY_CARE_PROVIDER_SITE_OTHER): Payer: Medicare HMO | Admitting: Physician Assistant

## 2021-06-21 DIAGNOSIS — F332 Major depressive disorder, recurrent severe without psychotic features: Secondary | ICD-10-CM | POA: Diagnosis not present

## 2021-06-21 DIAGNOSIS — F5105 Insomnia due to other mental disorder: Secondary | ICD-10-CM | POA: Diagnosis not present

## 2021-06-21 DIAGNOSIS — F411 Generalized anxiety disorder: Secondary | ICD-10-CM

## 2021-06-21 DIAGNOSIS — F99 Mental disorder, not otherwise specified: Secondary | ICD-10-CM

## 2021-06-21 MED ORDER — TRAZODONE HCL 100 MG PO TABS: 100.0000 mg | ORAL_TABLET | Freq: Every day | ORAL | 1 refills | Status: DC

## 2021-06-21 MED ORDER — HYDROXYZINE HCL 25 MG PO TABS: 25.0000 mg | ORAL_TABLET | Freq: Three times a day (TID) | ORAL | 1 refills | Status: DC | PRN

## 2021-06-21 MED ORDER — CITALOPRAM HYDROBROMIDE 20 MG PO TABS: 20.0000 mg | ORAL_TABLET | Freq: Every day | ORAL | 1 refills | Status: DC

## 2021-06-23 ENCOUNTER — Ambulatory Visit (INDEPENDENT_AMBULATORY_CARE_PROVIDER_SITE_OTHER): Payer: Medicare HMO | Admitting: Licensed Clinical Social Worker

## 2021-06-23 ENCOUNTER — Other Ambulatory Visit: Payer: Self-pay

## 2021-06-23 ENCOUNTER — Encounter (HOSPITAL_COMMUNITY): Payer: Self-pay | Admitting: Physician Assistant

## 2021-06-23 DIAGNOSIS — F3341 Major depressive disorder, recurrent, in partial remission: Secondary | ICD-10-CM

## 2021-06-23 DIAGNOSIS — F411 Generalized anxiety disorder: Secondary | ICD-10-CM

## 2021-06-23 NOTE — Progress Notes (Signed)
° °  THERAPIST PROGRESS NOTE  Session Time: 35 min.  Participation Level: Active  Behavioral Response: CasualAlertAnxious  Type of Therapy: Individual Therapy  Treatment Goals addressed: Communication: dep/anx/coping  Interventions: Supportive  Summary: Spencer Fischer is a 56 y.o. male who presents with hx of dep/anx.  Today patient returns for in person session.  Patient last seen November 2.  Patient is noted to be in a mostly positive mood however signs and symptoms of anxiety appear more prevalent.  LCSW assessed for patient's medications and any changes from last med management appointment 2 days ago.  Patient reports some confusion about changes that were made and states intent to contact the pharmacy post session.  In exploration of patient's noticeable restlessness he reports he is having hip pain.  He states he had not taken his medication this morning to be certain he was safe to drive to this appointment.  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  He provides details and states he likes being around the younger children and hopes he can mentor some of them.  Assessment of patient's housing situation reveals he is closer per his report to obtaining his own housing and is working with the case manager in the transitional housing program.  Patient reports his mother is doing "great" since her surgery.  He continues to live with her at this time and states he feels she would benefit from coming to this facility for help with her anx.  He states intent to talk with her about the situation.  LCSW assessed for any New Year's resolutions.  Patient reports he wants to continue to be a good steward in the [employer], wants to stop smoking and work on his weight.  LCSW provided resources for stop smoking efforts and encouragement for all of patient's stated goals.  Patient continues to rely heavily on his faith for coping. He remains free  of all substances except nicotine.  Patient denies new worries or concerns to address today. LCSW reviewed poc including scheduling prior to close of session. Pt states appreciation for care.   Suicidal/Homicidal: Nowithout intent/plan  Therapist Response: Pt receptive to care.  Plan: Return again in ~4 weeks.  Diagnosis: Axis I: Generalized Anxiety Disorder and MDD, partial remission  [place], LCSW 06/23/2021

## 2021-06-23 NOTE — Progress Notes (Signed)
[place] MD/PA/NP OP Progress Note  Virtual Visit via Telephone Note  I connected with Priscille Heidelberg Prestage on 06/21/21 at  8:30 AM EST by telephone and verified that I am speaking with the correct person using two identifiers.  Location: Patient: Home Provider: Clinic   I discussed the limitations, risks, security and privacy concerns of performing an evaluation and management service by telephone and the availability of in person appointments. I also discussed with the patient that there may be a patient responsible charge related to this service. The patient expressed understanding and agreed to proceed.  Follow Up Instructions:  I discussed the assessment and treatment plan with the patient. The patient was provided an opportunity to ask questions and all were answered. The patient agreed with the plan and demonstrated an understanding of the instructions.   The patient was advised to call back or seek an in-person evaluation if the symptoms worsen or if the condition fails to improve as anticipated.  I provided 14 minutes of non-face-to-face time during this encounter.  Malachy Mood, PA   06/21/2021 11:51 AM Danella Maiers  MRN:  [id number]  Chief Complaint: Follow up and medication management  HPI:   Kirubel Nunn. Noone is a 56 year old male with a past psychiatric history significant for generalized anxiety disorder, major depressive disorder, and insomnia who presents to [facility] via virtual telephone visit for follow-up and medication management.  Patient is currently being managed on the following medications:  Hydroxyzine 25 mg 3 times daily as needed Citalopram 10 mg daily Trazodone 100 mg at bedtime  Patient reports no issues or concerns regarding his current medication regimen.  Patient continues taking his medications as prescribed and endorses that they are still effective.  He reports that his medications keep him from being depressed,  however, he does endorse the ongoing stressor of finding a house for himself to live in.  Patient is currently living with his sister at this time.  Patient endorses mild depressive symptoms mostly related to a stressor but denies episodes of breaking down.  Patient endorses minimal anxiety as long as he takes his anxiety medications.  A PHQ-9 screen was performed with the patient scoring a 12.  A GAD-7 screen was also performed with the patient scoring a 14.  Patient is alert and oriented x4, calm, cooperative, and fully engaged in conversation during the encounter.  Patient denies suicidal or homicidal ideation.  He further denies auditory or visual hallucinations and does not appear to be responding to internal/external stimuli.  Patient endorses fair sleep and receives on average 5 hours of sleep each night.  He attributes his sleep disturbances to his health conditions.  Patient endorses good appetite and eats on average 2 meals per day.  Patient denies alcohol consumption and illicit drug use.  Patient endorses tobacco use and smokes on average 4 cigarettes/day.  Visit Diagnosis:    ICD-10-CM   1. Severe episode of recurrent major depressive disorder, without psychotic features ([place])  F33.2 citalopram (CELEXA) 20 MG tablet    2. Insomnia due to other mental disorder  F51.05 traZODone (DESYREL) 100 MG tablet   F99     3. Generalized anxiety disorder  F41.1 hydrOXYzine (ATARAX) 25 MG tablet      Past Psychiatric History:  Generalized anxiety disorder Major depressive disorder  Past Medical History:  Past Medical History:  Diagnosis Date   Anxiety    Arthritis    Carpal tunnel syndrome  Carpal tunnel syndrome, left    Depression    GERD (gastroesophageal reflux disease)    Hypertension    Sleep apnea    wears CPAP    Past Surgical History:  Procedure Laterality Date   BILATERAL ANTERIOR TOTAL HIP ARTHROPLASTY Bilateral 04/06/2015   Procedure: BILATERAL ANTERIOR TOTAL HIP  ARTHROPLASTY;  Surgeon: Mcarthur Rossetti, MD;  Location: [place];  Service: Orthopedics;  Laterality: Bilateral;   CARPAL TUNNEL RELEASE Left 08/24/2016   Procedure: LEFT OPEN CARPAL TUNNEL RELEASE;  Surgeon: Mcarthur Rossetti, MD;  Location: [place];  Service: Orthopedics;  Laterality: Left;   CARPAL TUNNEL RELEASE Right 05/11/2021   Procedure: RIGHT CARPAL TUNNEL RELEASE;  Surgeon: Sherilyn Cooter, MD;  Location: [place];  Service: Orthopedics;  Laterality: Right;   KNEE SURGERY Left    WISDOM TOOTH EXTRACTION      Family Psychiatric History:  Patient denies a family history of psychiatric illness  Family History:  Family History  Problem Relation Age of Onset   Hypertension Mother    Hypertension Sister    Hypertension Brother     Social History:  Social History   Socioeconomic History   Marital status: Single    Spouse name: Not on file   Number of children: Not on file   Years of education: Not on file   Highest education level: Not on file  Occupational History   Not on file  Tobacco Use   Smoking status: Every Day    Packs/day: 0.25    Types: Cigarettes   Smokeless tobacco: Never  Substance and Sexual Activity   Alcohol use: Not Currently    Comment: 4- 40 ounces of beer/week   Drug use: No   Sexual activity: Not on file  Other Topics Concern   Not on file  Social History Narrative   Not on file   Social Determinants of Health   Financial Resource Strain: Not on file  Food Insecurity: Not on file  Transportation Needs: Not on file  Physical Activity: Not on file  Stress: Not on file  Social Connections: Not on file    Allergies: No Known Allergies  Metabolic Disorder Labs: Lab Results  Component Value Date   HGBA1C 5.20f 10/01/2015   No results found for: PROLACTIN No results found for: CHOL, TRIG, HDL, CHOLHDL, VLDL, LDLCALC Lab Results  Component Value Date   TSH 3.307 04/16/2015    Therapeutic Level Labs: No  results found for: LITHIUM No results found for: VALPROATE No components found for:  CBMZ  Current Medications: Current Outpatient Medications  Medication Sig Dispense Refill   baclofen (LIORESAL) 20 MG tablet Take 20 mg by mouth 3 (three) times daily as needed.     citalopram (CELEXA) 20 MG tablet Take 1 tablet (20 mg total) by mouth daily. 30 tablet 1   diclofenac (VOLTAREN) 75 MG EC tablet Take 1 tablet (75 mg total) by mouth 2 (two) times daily. 28 tablet 0   hydrOXYzine (ATARAX) 25 MG tablet Take 1 tablet (25 mg total) by mouth 3 (three) times daily as needed. 75 tablet 1   lisinopril (ZESTRIL) 20 MG tablet Take 20 mg by mouth daily.     omeprazole (PRILOSEC) 40 MG capsule Take 40 mg by mouth daily.     SUTAB [phone] MG TABS SMARTSIG:24 Tablet(s) By Mouth     traZODone (DESYREL) 100 MG tablet Take 1 tablet (100 mg total) by mouth at bedtime. 30 tablet 1   No current  facility-administered medications for this visit.     Musculoskeletal: Strength & Muscle Tone: Unable to assess due to telemedicine visit [place]: Unable to assess due to telemedicine visit Patient leans: Unable to assess due to telemedicine visit  Psychiatric Specialty Exam: Review of Systems  Psychiatric/Behavioral:  Positive for sleep disturbance. Negative for decreased concentration, dysphoric mood, hallucinations, self-injury and suicidal ideas. The patient is nervous/anxious. The patient is not hyperactive.    There were no vitals taken for this visit.There is no height or weight on file to calculate BMI.  General Appearance: Unable to assess due to telemedicine visit  Eye Contact:  Unable to assess due to telemedicine visit  Speech:  Clear and Coherent and Normal Rate  Volume:  Normal  Mood:  Anxious and Depressed  Affect:  Congruent  Thought Process:  Coherent, Goal Directed, and Descriptions of Associations: Intact  Orientation:  Full (Time, Place, and Person)  Thought Content: WDL    Suicidal Thoughts:  No  Homicidal Thoughts:  No  Memory:  Immediate;   Good Recent;   Good Remote;   Good  Judgement:  Good  Insight:  Fair  Psychomotor Activity:  Normal  Concentration:  Concentration: Good and Attention Span: Good  Recall:  Good  Fund of Knowledge: Good  Language: Good  Akathisia:  NA  Handed:  Right  AIMS (if indicated): not done  Assets:  Communication Skills Desire for Improvement Housing Social Support Transportation  ADL's:  Intact  Cognition: WNL  Sleep:  Fair   Screenings: GAD-7    Flowsheet Row Video Visit from 06/21/2021 in [facility] Office Visit from 04/15/2021 in [facility] Office Visit from 12/23/2020 in [facility] Office Visit from 10/01/2015 in [place]  Total GAD-7 Score 14 13 21 7       PHQ2-9    Flowsheet Row Video Visit from 06/21/2021 in [facility] Office Visit from 04/15/2021 in [facility] Office Visit from 12/23/2020 in [facility] Counselor from 11/30/2020 in [facility] Office Visit from 10/01/2015 in [place]  PHQ-2 Total Score 4 0 5 4 4   PHQ-9 Total Score 12 -- 20 19 14       Flowsheet Row Video Visit from 06/21/2021 in [facility] Admission (Discharged) from 05/11/2021 in [place] Visit from 04/15/2021 in [place] No Risk No Risk No Risk        Assessment and Plan:   Keshan Tureaud. Hisle is a 56 year old male with a past psychiatric history significant for generalized anxiety disorder, major depressive disorder, and insomnia who presents to [facility] via virtual telephone visit for follow-up and medication management.  Patient endorses mild  depressive symptoms and reports that his anxiety is minimal as long as he takes his medications.  He attributes his depressive symptoms to his current stressors. Patient was recommended increasing his Citalopram dosage from 10 mg to 20 mg daily for the management of his depressive symptoms and anxiety. Patient was agreeable to recommendation. Patient's medication to be e-prescribed to pharmacy of choice.  1. Severe episode of recurrent major depressive disorder, without psychotic features ([place])  - citalopram (CELEXA) 20 MG tablet; Take 1 tablet (20 mg total) by mouth daily.  Dispense: 30 tablet; Refill: 1  2. Insomnia due  to other mental disorder  - traZODone (DESYREL) 100 MG tablet; Take 1 tablet (100 mg total) by mouth at bedtime.  Dispense: 30 tablet; Refill: 1  3. Generalized anxiety disorder  - hydrOXYzine (ATARAX) 25 MG tablet; Take 1 tablet (25 mg total) by mouth 3 (three) times daily as needed.  Dispense: 75 tablet; Refill: 1  Patient to follow up in 2 months Provider spent a total of 14 minutes with the patient/reviewing patient's chart  Malachy Mood, PA 06/23/2021, 11:51 AM

## 2021-07-19 ENCOUNTER — Ambulatory Visit (INDEPENDENT_AMBULATORY_CARE_PROVIDER_SITE_OTHER): Payer: Medicare HMO | Admitting: Orthopedic Surgery

## 2021-07-19 ENCOUNTER — Other Ambulatory Visit: Payer: Self-pay

## 2021-07-19 DIAGNOSIS — G5601 Carpal tunnel syndrome, right upper limb: Secondary | ICD-10-CM

## 2021-07-19 MED ORDER — MELOXICAM 7.5 MG PO TABS: 7.5000 mg | ORAL_TABLET | Freq: Every day | ORAL | 0 refills | Status: AC

## 2021-07-19 NOTE — Progress Notes (Signed)
Post-Op Visit Note   Patient: Spencer Fischer           Date of Birth: August 18, 1965           MRN: [id number] Visit Date: 07/19/2021 PCP: Sandi Mariscal, MD   Assessment & Plan:  Chief Complaint:  Chief Complaint  Patient presents with   Right Hand - Follow-up, Pain, Weakness   Visit Diagnoses:  1. Carpal tunnel syndrome of right wrist     Plan: Patient is almost 10 weeks out from right carpal tunnel release.  His previous numbness, paresthesias, and nocturnal symptoms have completely resolved.  He has 2PD <66mm in the median distribution on the right.  His issue today is aching wrist pain w/ prolonged activity at work.  The pain seems to be in the palm and thenar eminence.  He feels like his hand is tight.  He has 5/5 thenar motor but lacks a little bit of opposition compared to the contralateral side secondary to subjective "tightness."  This likely represents a form of "pillar pain" from his palmar incision.  We discussed use of an oral anti-inflammatory and continued monitoring.  I can see him back in another 6 weeks to see how he's doing.   Follow-Up Instructions: No follow-ups on file.   Orders:  No orders of the defined types were placed in this encounter.  No orders of the defined types were placed in this encounter.   Imaging: No results found.  PMFS History: Patient Active Problem List   Diagnosis Date Noted   Generalized anxiety disorder 12/23/2020   Severe episode of recurrent major depressive disorder, without psychotic features ([place]) 12/23/2020   Insomnia due to other mental disorder 12/23/2020   Carpal tunnel syndrome, left upper limb 08/09/2016   Obesity 06/06/2016   OSA (obstructive sleep apnea) 02/17/2016   Obesity (BMI 30.0-34.9) 12/02/2015   Mild obstructive sleep apnea 10/01/2015   HTN (hypertension) 10/01/2015   Current smoker 10/01/2015   Left leg claudication ([place]) 10/01/2015   Elevated WBC count 10/01/2015   Chronic low back pain without sciatica  10/01/2015   Carpal tunnel syndrome 10/01/2015   Hypokalemia 04/14/2015   GERD (gastroesophageal reflux disease) 04/13/2015   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   Past Medical History:  Diagnosis Date   Anxiety    Arthritis    Carpal tunnel syndrome    Carpal tunnel syndrome, left    Depression    GERD (gastroesophageal reflux disease)    Hypertension    Sleep apnea    wears CPAP    Family History  Problem Relation Age of Onset   Hypertension Mother    Hypertension Sister    Hypertension Brother     Past Surgical History:  Procedure Laterality Date   BILATERAL ANTERIOR TOTAL HIP ARTHROPLASTY Bilateral 04/06/2015   Procedure: BILATERAL ANTERIOR TOTAL HIP ARTHROPLASTY;  Surgeon: Mcarthur Rossetti, MD;  Location: [place];  Service: Orthopedics;  Laterality: Bilateral;   CARPAL TUNNEL RELEASE Left 08/24/2016   Procedure: LEFT OPEN CARPAL TUNNEL RELEASE;  Surgeon: Mcarthur Rossetti, MD;  Location: [place];  Service: Orthopedics;  Laterality: Left;   CARPAL TUNNEL RELEASE Right 05/11/2021   Procedure: RIGHT CARPAL TUNNEL RELEASE;  Surgeon: Sherilyn Cooter, MD;  Location: [place];  Service: Orthopedics;  Laterality: Right;   KNEE SURGERY Left    WISDOM TOOTH EXTRACTION     Social History   Occupational History   Not on file  Tobacco Use   Smoking  status: Every Day    Packs/day: 0.25    Types: Cigarettes   Smokeless tobacco: Never  Substance and Sexual Activity   Alcohol use: Not Currently    Comment: 4- 40 ounces of beer/week   Drug use: No   Sexual activity: Not on file

## 2021-07-28 ENCOUNTER — Other Ambulatory Visit (HOSPITAL_COMMUNITY): Payer: Self-pay | Admitting: Physician Assistant

## 2021-07-28 DIAGNOSIS — F332 Major depressive disorder, recurrent severe without psychotic features: Secondary | ICD-10-CM

## 2021-08-04 ENCOUNTER — Ambulatory Visit (INDEPENDENT_AMBULATORY_CARE_PROVIDER_SITE_OTHER): Payer: Medicaid Other | Admitting: Licensed Clinical Social Worker

## 2021-08-04 DIAGNOSIS — F3342 Major depressive disorder, recurrent, in full remission: Secondary | ICD-10-CM | POA: Diagnosis not present

## 2021-08-08 ENCOUNTER — Other Ambulatory Visit (HOSPITAL_COMMUNITY): Payer: Self-pay | Admitting: Physician Assistant

## 2021-08-08 DIAGNOSIS — F99 Mental disorder, not otherwise specified: Secondary | ICD-10-CM

## 2021-08-08 DIAGNOSIS — F5105 Insomnia due to other mental disorder: Secondary | ICD-10-CM

## 2021-08-23 ENCOUNTER — Encounter (HOSPITAL_COMMUNITY): Payer: Medicare HMO | Admitting: Physician Assistant

## 2021-08-25 ENCOUNTER — Other Ambulatory Visit (HOSPITAL_COMMUNITY): Payer: Self-pay | Admitting: Physician Assistant

## 2021-08-29 NOTE — Telephone Encounter (Signed)
Patient must be set up with a follow up appointment for medication(s) to be filled.

## 2021-09-05 ENCOUNTER — Ambulatory Visit (HOSPITAL_COMMUNITY): Payer: Medicare HMO | Admitting: Licensed Clinical Social Worker

## 2021-09-15 ENCOUNTER — Encounter (HOSPITAL_COMMUNITY): Payer: Self-pay | Admitting: Physician Assistant

## 2021-09-15 ENCOUNTER — Ambulatory Visit (INDEPENDENT_AMBULATORY_CARE_PROVIDER_SITE_OTHER): Payer: Medicare HMO | Admitting: Physician Assistant

## 2021-09-15 DIAGNOSIS — F411 Generalized anxiety disorder: Secondary | ICD-10-CM

## 2021-09-15 DIAGNOSIS — F99 Mental disorder, not otherwise specified: Secondary | ICD-10-CM

## 2021-09-15 DIAGNOSIS — F332 Major depressive disorder, recurrent severe without psychotic features: Secondary | ICD-10-CM

## 2021-09-15 DIAGNOSIS — F5105 Insomnia due to other mental disorder: Secondary | ICD-10-CM

## 2021-09-15 MED ORDER — TRAZODONE HCL 100 MG PO TABS: 100.0000 mg | ORAL_TABLET | Freq: Every day | ORAL | 2 refills | Status: DC

## 2021-09-15 MED ORDER — HYDROXYZINE HCL 25 MG PO TABS: 25.0000 mg | ORAL_TABLET | Freq: Three times a day (TID) | ORAL | 1 refills | Status: DC | PRN

## 2021-09-15 MED ORDER — CITALOPRAM HYDROBROMIDE 20 MG PO TABS: 20.0000 mg | ORAL_TABLET | Freq: Every day | ORAL | 2 refills | Status: DC

## 2021-09-15 NOTE — Patient Instructions (Signed)
1 

## 2021-09-15 NOTE — Progress Notes (Addendum)
[facility] MD/PA/NP OP Progress Note ? ?Virtual Visit via Telephone Note ? ?I connected with Spencer Genre Cordy on 09/15/21 at  2:00 PM EDT by telephone and verified that I am speaking with the correct person using two identifiers. ? ?Location: ?Patient: Home ?Provider: Clinic ?  ?I discussed the limitations, risks, security and privacy concerns of performing an evaluation and management service by telephone and the availability of in person appointments. I also discussed with the patient that there may be a patient responsible charge related to this service. The patient expressed understanding and agreed to proceed. ? ?Follow Up Instructions: ? ?I discussed the assessment and treatment plan with the patient. The patient was provided an opportunity to ask questions and all were answered. The patient agreed with the plan and demonstrated an understanding of the instructions. ?  ?The patient was advised to call back or seek an in-person evaluation if the symptoms worsen or if the condition fails to improve as anticipated. ? ?I provided 15 minutes of non-face-to-face time during this encounter. ? ?Meta Hatchet, PA  ? ? ?09/15/2021 6:13 PM ?Spencer Fischer  ?MRN:  [id number] ? ?Chief Complaint:  ?Chief Complaint  ?Patient presents with  ? Follow-up  ? Medication Refill  ? ?HPI:  ? ?Spencer Fischer is a 56 year old male with a past psychiatric history significant for major depressive disorder, insomnia, and generalized anxiety disorder who presents to [facility] via virtual telephone visit for follow-up and medication management.  Patient is currently being managed on the following medications: ? ?Celexa 20 mg daily ?Trazodone 100 mg at bedtime ?Hydroxyzine 25 mg 3 times daily as needed ? ?Patient reports that his medications are doing what they are supposed to be doing.  Patient denies experiencing any adverse reaction from his medication usage.  Patient denies depressive episodes.  Patient  endorses minimal anxiety stating that he tries to cool himself from doing too much.  A PHQ-9 screen was performed with the patient scoring a 15.  A GAD-7 screen was also performed with the patient scoring a 10. ? ?Patient is alert and oriented x4, pleasant, calm, cooperative, and fully engaged in conversation during the encounter.  Patient endorses fair mood and reports being stable.  Patient denies suicidal or homicidal ideations.  He further denies auditory or visual hallucinations and does not appear to be responding to internal/external stimuli.  Patient endorses fair sleep and receives on average 6 hours of sleep each night.  Patient endorses fair appetite and eats on average 2 meals per day.  Patient denies alcohol consumption and illicit drug use.  Patient endorses tobacco use and smokes on average 5 cigarettes/day. ? ?Visit Diagnosis:  ?  ICD-10-CM   ?1. Severe episode of recurrent major depressive disorder, without psychotic features (H[facility])  F33.2 citalopram (CELEXA) 20 MG tablet  ?  ?2. Insomnia due to other mental disorder  F51.05 traZODone (DESYREL) 100 MG tablet  ? F99   ?  ?3. Generalized anxiety disorder  F41.1 hydrOXYzine (ATARAX) 25 MG tablet  ?  ? ? ?Past Psychiatric History:  ?Generalized anxiety disorder ?Major depressive disorder ? ?Past Medical History:  ?Past Medical History:  ?Diagnosis Date  ? Anxiety   ? Arthritis   ? Carpal tunnel syndrome   ? Carpal tunnel syndrome, left   ? Depression   ? GERD (gastroesophageal reflux disease)   ? Hypertension   ? Sleep apnea   ? wears CPAP  ?  ?Past Surgical History:  ?  Procedure Laterality Date  ? BILATERAL ANTERIOR TOTAL HIP ARTHROPLASTY Bilateral 04/06/2015  ? Procedure: BILATERAL ANTERIOR TOTAL HIP ARTHROPLASTY;  Surgeon: Kathryne Hitchhristopher Y Blackman, MD;  [facility];  Service: Orthopedics;  Laterality: Bilateral;  ? CARPAL TUNNEL RELEASE Left 08/24/2016  ? Procedure: LEFT OPEN CARPAL TUNNEL RELEASE;  Surgeon: Kathryne Hitchhristopher Y Blackman, MD;  [facility];  Service: Orthopedics;  Laterality: Left;  ? CARPAL TUNNEL RELEASE Right 05/11/2021  ? Procedure: RIGHT CARPAL TUNNEL RELEASE;  Surgeon: Marlyne BeardsBenfield, Charlie, MD;  Location: [facility];  Service: Orthopedics;  Laterality: Right;  ? KNEE SURGERY Left   ? WISDOM TOOTH EXTRACTION    ? ? ?Family Psychiatric History:  ?Patient denies a family history of psychiatric illness ? ?Family History:  ?Family History  ?Problem Relation Age of Onset  ? Hypertension Mother   ? Hypertension Sister   ? Hypertension Brother   ? ? ?Social History:  ?Social History  ? ?Socioeconomic History  ? Marital status: Single  ?  Spouse name: Not on file  ? Number of children: Not on file  ? Years of education: Not on file  ? Highest education level: Not on file  ?Occupational History  ? Not on file  ?Tobacco Use  ? Smoking status: Every Day  ?  Packs/day: 0.25  ?  Types: Cigarettes  ? Smokeless tobacco: Never  ?Substance and Sexual Activity  ? Alcohol use: Not Currently  ?  Comment: 4- 40 ounces of beer/week  ? Drug use: No  ? Sexual activity: Not on file  ?Other Topics Concern  ? Not on file  ?Social History Narrative  ? Not on file  ? ?Social Determinants of Health  ? ?Financial Resource Strain: Not on file  ?Food Insecurity: Not on file  ?Transportation Needs: Not on file  ?Physical Activity: Not on file  ?Stress: Not on file  ?Social Connections: Not on file  ? ? ?Allergies: No Known Allergies ? ?Metabolic Disorder Labs: ?Lab Results  ?Component Value Date  ? HGBA1C 5.882f 10/01/2015  ? ?No results found for: PROLACTIN ?No results found for: CHOL, TRIG, HDL, CHOLHDL, VLDL, LDLCALC ?Lab Results  ?Component Value Date  ? TSH 3.307 04/16/2015  ? ? ?Therapeutic Level Labs: ?No results found for: LITHIUM ?No results found for: VALPROATE ?No components found for:  CBMZ ? ?Current Medications: ?Current Outpatient Medications  ?Medication Sig Dispense Refill  ? baclofen (LIORESAL) 20 MG tablet Take 20 mg by mouth 3 (three) times daily  as needed.    ? citalopram (CELEXA) 20 MG tablet Take 1 tablet (20 mg total) by mouth daily. 30 tablet 2  ? hydrOXYzine (ATARAX) 25 MG tablet Take 1 tablet (25 mg total) by mouth 3 (three) times daily as needed. 75 tablet 1  ? lisinopril (ZESTRIL) 20 MG tablet Take 20 mg by mouth daily.    ? omeprazole (PRILOSEC) 40 MG capsule Take 40 mg by mouth daily.    ? SUTAB [id number] MG TABS SMARTSIG:24 Tablet(s) By Mouth    ? traZODone (DESYREL) 100 MG tablet Take 1 tablet (100 mg total) by mouth at bedtime. 30 tablet 2  ? ?No current facility-administered medications for this visit.  ? ? ? ?Musculoskeletal: ?Strength & Muscle Tone: Unable to assess due to telemedicine visit ?Gait & Station: Unable to assess due to telemedicine visit ?Patient leans: Unable to assess due to telemedicine visit ? ?Psychiatric Specialty Exam: ?Review of Systems  ?Psychiatric/Behavioral:  Positive for sleep disturbance. Negative for decreased concentration, dysphoric  mood, hallucinations, self-injury and suicidal ideas. The patient is nervous/anxious. The patient is not hyperactive.    ?There were no vitals taken for this visit.There is no height or weight on file to calculate BMI.  ?General Appearance: Unable to assess due to telemedicine visit  ?Eye Contact:  Unable to assess due to telemedicine visit  ?Speech:  Clear and Coherent and Normal Rate  ?Volume:  Normal  ?Mood:  Anxious and Euthymic  ?Affect:  Congruent  ?Thought Process:  Coherent and Descriptions of Associations: Intact  ?Orientation:  Full (Time, Place, and Person)  ?Thought Content: WDL   ?Suicidal Thoughts:  No  ?Homicidal Thoughts:  No  ?Memory:  Immediate;   Good ?Recent;   Good ?Remote;   Good  ?Judgement:  Good  ?Insight:  Fair  ?Psychomotor Activity:  Normal  ?Concentration:  Concentration: Good and Attention Span: Good  ?Recall:  Good  ?Fund of Knowledge: Good  ?Language: Good  ?Akathisia:  No  ?Handed:  Right  ?AIMS (if indicated): not done  ?Assets:  Communication  Skills ?Desire for Improvement ?Housing ?Social Support ?Transportation  ?ADL's:  Intact  ?Cognition: WNL  ?Sleep:  Fair  ? ?Screenings: ?GAD-7   ? ?Flowsheet Row Office Visit from 09/15/2021 in [facility]

## 2021-09-23 ENCOUNTER — Other Ambulatory Visit (HOSPITAL_COMMUNITY): Payer: Self-pay | Admitting: Physician Assistant

## 2021-09-23 DIAGNOSIS — F411 Generalized anxiety disorder: Secondary | ICD-10-CM

## 2021-10-19 ENCOUNTER — Ambulatory Visit (HOSPITAL_COMMUNITY): Payer: Medicare HMO | Admitting: Licensed Clinical Social Worker

## 2021-11-01 ENCOUNTER — Encounter (HOSPITAL_COMMUNITY): Payer: Self-pay

## 2021-11-01 ENCOUNTER — Emergency Department (HOSPITAL_COMMUNITY)
Admission: EM | Admit: 2021-11-01 | Discharge: 2021-11-01 | Disposition: A | Discharge status: Home / Self Care | Payer: Medicare HMO | Attending: Emergency Medicine | Admitting: Emergency Medicine

## 2021-11-01 DIAGNOSIS — M25561 Pain in right knee: Secondary | ICD-10-CM | POA: Diagnosis not present

## 2021-11-01 DIAGNOSIS — Z760 Encounter for issue of repeat prescription: Secondary | ICD-10-CM | POA: Insufficient documentation

## 2021-11-01 DIAGNOSIS — M25562 Pain in left knee: Secondary | ICD-10-CM | POA: Insufficient documentation

## 2021-11-01 DIAGNOSIS — M5441 Lumbago with sciatica, right side: Secondary | ICD-10-CM | POA: Insufficient documentation

## 2021-11-01 DIAGNOSIS — M5442 Lumbago with sciatica, left side: Secondary | ICD-10-CM | POA: Diagnosis not present

## 2021-11-01 DIAGNOSIS — G8929 Other chronic pain: Secondary | ICD-10-CM

## 2021-11-01 MED ORDER — OXYCODONE HCL 5 MG PO TABS
10.0000 mg | ORAL_TABLET | Freq: Once | ORAL | Status: AC
  Administered 2021-11-01: 10 mg via ORAL
  Filled 2021-11-01: qty 2
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
## 2021-11-01 NOTE — Discharge Instructions (Signed)
Please call your family doctor and let them know that have a policy that we do not manage chronic pain in the emergency department.  We discussed with them about possibly providing you with a medication to bridge you to see pain management.

## 2021-11-02 ENCOUNTER — Encounter (HOSPITAL_COMMUNITY): Payer: Self-pay

## 2021-11-02 ENCOUNTER — Ambulatory Visit (HOSPITAL_COMMUNITY): Payer: Medicare HMO | Admitting: Licensed Clinical Social Worker

## 2021-11-15 ENCOUNTER — Ambulatory Visit (INDEPENDENT_AMBULATORY_CARE_PROVIDER_SITE_OTHER): Payer: Medicare HMO | Admitting: Physician Assistant

## 2021-11-15 DIAGNOSIS — F99 Mental disorder, not otherwise specified: Secondary | ICD-10-CM

## 2021-11-15 DIAGNOSIS — F332 Major depressive disorder, recurrent severe without psychotic features: Secondary | ICD-10-CM

## 2021-11-15 DIAGNOSIS — F411 Generalized anxiety disorder: Secondary | ICD-10-CM

## 2021-11-15 DIAGNOSIS — F5105 Insomnia due to other mental disorder: Secondary | ICD-10-CM | POA: Diagnosis not present

## 2021-11-16 ENCOUNTER — Encounter (HOSPITAL_COMMUNITY): Payer: Self-pay | Admitting: Physician Assistant

## 2021-11-16 MED ORDER — HYDROXYZINE HCL 25 MG PO TABS: 25.0000 mg | ORAL_TABLET | Freq: Three times a day (TID) | ORAL | 1 refills | Status: DC | PRN

## 2021-11-16 MED ORDER — TRAZODONE HCL 100 MG PO TABS: 100.0000 mg | ORAL_TABLET | Freq: Every day | ORAL | 2 refills | Status: DC

## 2021-11-16 MED ORDER — CITALOPRAM HYDROBROMIDE 20 MG PO TABS: 20.0000 mg | ORAL_TABLET | Freq: Every day | ORAL | 2 refills | Status: DC

## 2021-11-16 NOTE — Progress Notes (Cosign Needed)
BH MD/PA/NP OP Progress Note  Virtual Visit via Telephone Note  I connected with Spencer Fischer on 11/16/21 at  9:30 AM EDT by telephone and verified that I am speaking with the correct person using two identifiers.  Location: Patient: Home Provider: Clinic   I discussed the limitations, risks, security and privacy concerns of performing an evaluation and management service by telephone and the availability of in person appointments. I also discussed with the patient that there may be a patient responsible charge related to this service. The patient expressed understanding and agreed to proceed.  Follow Up Instructions:  I discussed the assessment and treatment plan with the patient. The patient was provided an opportunity to ask questions and all were answered. The patient agreed with the plan and demonstrated an understanding of the instructions.   The patient was advised to call back or seek an in-person evaluation if the symptoms worsen or if the condition fails to improve as anticipated.  I provided 21 minutes of non-face-to-face time during this encounter.  Meta Hatchet, PA    11/16/2021 12:46 PM Spencer Fischer  MRN:  [id number]  Chief Complaint:  No chief complaint on file.  HPI:   Spencer Fischer is a 56 year old male with a past psychiatric history significant for major depressive disorder, insomnia, and generalized anxiety disorder who presents to [facility] via virtual telephone visit for follow-up and medication management.  Patient is currently being managed on the following medications:  Celexa 20 mg daily Trazodone 100 mg at bedtime Hydroxyzine 25 mg 3 times daily as needed  Patient reports no issues or concerns regarding his current medication regimen.  Patient reports that he normally takes his hydroxyzine 2 times daily when "feeling some type of way" or in distress.  He endorses continuing to take his other medications as  prescribed and even though he continues to have moments, he believes that his medications have been ultimately helping him.  He endorses feeling more calm and not following victim to thoughts that leave him depressed.  Patient endorses depressive episodes roughly 2 times a week.  Patient's depressive episodes are mainly characterized by low mood.  He denies experiencing crying spells and states that he often listens to the spiritual word when feeling well.  He endorses some anxiety but states that it is mostly manageable when taking his medications.  He reports that he has been in a much better mood since recently getting his own place.  A GAD-7 screen was performed with the patient scoring 11.  A PHQ-9 screen was performed with the patient scoring an 8.  Patient is alert and oriented x4, pleasant, calm, cooperative, and fully engaged in conversation during the encounter.  Patient endorses good mood.  Patient denies suicidal or homicidal ideations.  He further denies auditory or visual hallucinations and does not appear to be responding to internal/external stimuli.  Patient endorses good sleep and receives on average 9 hours of sleep each night.  Patient endorses good appetite and eats on average 2 meals a day.  Patient states that he has been trying to diet more.  Patient endorses alcohol consumption sparingly.  Patient endorses tobacco use and smokes on average 5 cigarettes/day.  Patient denies illicit drug use.  Visit Diagnosis:    ICD-10-CM   1. Severe episode of recurrent major depressive disorder, without psychotic features (HCC)  F33.2 citalopram (CELEXA) 20 MG tablet    2. Insomnia due to other mental disorder  F51.05 traZODone (DESYREL) 100 MG tablet   F99     3. Generalized anxiety disorder  F41.1 hydrOXYzine (ATARAX) 25 MG tablet      Past Psychiatric History:  Generalized anxiety disorder Major depressive disorder  Past Medical History:  Past Medical History:  Diagnosis Date    Anxiety    Arthritis    Carpal tunnel syndrome    Carpal tunnel syndrome, left    Depression    GERD (gastroesophageal reflux disease)    Hypertension    Sleep apnea    wears CPAP    Past Surgical History:  Procedure Laterality Date   BILATERAL ANTERIOR TOTAL HIP ARTHROPLASTY Bilateral 04/06/2015   Procedure: BILATERAL ANTERIOR TOTAL HIP ARTHROPLASTY;  Surgeon: Kathryne Hitch, MD;  Location: [facility] OR;  Service: Orthopedics;  Laterality: Bilateral;   CARPAL TUNNEL RELEASE Left 08/24/2016   Procedure: LEFT OPEN CARPAL TUNNEL RELEASE;  Surgeon: Kathryne Hitch, MD;  Location: [facility] OR;  Service: Orthopedics;  Laterality: Left;   CARPAL TUNNEL RELEASE Right 05/11/2021   Procedure: RIGHT CARPAL TUNNEL RELEASE;  Surgeon: Marlyne Beards, MD;  Location: [facility];  Service: Orthopedics;  Laterality: Right;   KNEE SURGERY Left    WISDOM TOOTH EXTRACTION      Family Psychiatric History:  Patient denies a family history of psychiatric illness  Family History:  Family History  Problem Relation Age of Onset   Hypertension Mother    Hypertension Sister    Hypertension Brother     Social History:  Social History   Socioeconomic History   Marital status: Single    Spouse name: Not on file   Number of children: Not on file   Years of education: Not on file   Highest education level: Not on file  Occupational History   Not on file  Tobacco Use   Smoking status: Every Day    Packs/day: 0.25    Types: Cigarettes   Smokeless tobacco: Never  Substance and Sexual Activity   Alcohol use: Not Currently    Comment: 4- 40 ounces of beer/week   Drug use: No   Sexual activity: Not on file  Other Topics Concern   Not on file  Social History Narrative   Not on file   Social Determinants of Health   Financial Resource Strain: Not on file  Food Insecurity: Not on file  Transportation Needs: Not on file  Physical Activity: Not on file  Stress: Not on file   Social Connections: Not on file    Allergies: No Known Allergies  Metabolic Disorder Labs: Lab Results  Component Value Date   HGBA1C 5.17f 10/01/2015   No results found for: PROLACTIN No results found for: CHOL, TRIG, HDL, CHOLHDL, VLDL, LDLCALC Lab Results  Component Value Date   TSH 3.307 04/16/2015    Therapeutic Level Labs: No results found for: LITHIUM No results found for: VALPROATE No components found for:  CBMZ  Current Medications: Current Outpatient Medications  Medication Sig Dispense Refill   baclofen (LIORESAL) 20 MG tablet Take 20 mg by mouth 3 (three) times daily as needed.     citalopram (CELEXA) 20 MG tablet Take 1 tablet (20 mg total) by mouth daily. 30 tablet 2   hydrOXYzine (ATARAX) 25 MG tablet Take 1 tablet (25 mg total) by mouth 3 (three) times daily as needed. 75 tablet 1   lisinopril (ZESTRIL) 20 MG tablet Take 20 mg by mouth daily.     omeprazole (PRILOSEC) 40 MG capsule Take  40 mg by mouth daily.     SUTAB [id number] MG TABS SMARTSIG:24 Tablet(s) By Mouth     traZODone (DESYREL) 100 MG tablet Take 1 tablet (100 mg total) by mouth at bedtime. 30 tablet 2   No current facility-administered medications for this visit.     Musculoskeletal: Strength & Muscle Tone: Unable to assess due to telemedicine visit Gait & Station: Unable to assess due to telemedicine visit Patient leans: Unable to assess due to telemedicine visit  Psychiatric Specialty Exam: Review of Systems  Psychiatric/Behavioral:  Positive for sleep disturbance. Negative for decreased concentration, dysphoric mood, hallucinations, self-injury and suicidal ideas. The patient is nervous/anxious. The patient is not hyperactive.    There were no vitals taken for this visit.There is no height or weight on file to calculate BMI.  General Appearance: Unable to assess due to telemedicine visit  Eye Contact:  Unable to assess due to telemedicine visit  Speech:  Clear and Coherent and Normal  Rate  Volume:  Normal  Mood:  Anxious and Euthymic  Affect:  Congruent  Thought Process:  Coherent and Descriptions of Associations: Intact  Orientation:  Full (Time, Place, and Person)  Thought Content: WDL   Suicidal Thoughts:  No  Homicidal Thoughts:  No  Memory:  Immediate;   Good Recent;   Good Remote;   Good  Judgement:  Good  Insight:  Fair  Psychomotor Activity:  Normal  Concentration:  Concentration: Good and Attention Span: Good  Recall:  Good  Fund of Knowledge: Good  Language: Good  Akathisia:  No  Handed:  Right  AIMS (if indicated): not done  Assets:  Communication Skills Desire for Improvement Housing Social Support Transportation  ADL's:  Intact  Cognition: WNL  Sleep:  Fair   Screenings: GAD-7    Garment/textile technologistlowsheet Row [facility]  Total GAD-7 Score 11 10 14 13 21       PHQ2-9    Flowsheet Row [facility]  PHQ-2 Total Score 4 3 4  0 5  PHQ-9 Total Score 8 15 12  -- 20      Flowsheet Row [facility]  C-SSRS RISK CATEGORY No Risk No Risk No Risk        Assessment and Plan:   Spencer Fischer  is a 56 year old male with a past psychiatric history significant for major depressive disorder, insomnia, and generalized anxiety disorder [facility] virtual telephone visit for follow-up and medication management.  Patient reports no issues or concerns regarding his current medication regimen.  He continues to endorse some depressive symptoms and anxiety but states that most of the symptoms are managed through the use of his medications.  Patient appears stable on his current regimen and agrees to continue taking his medications  as prescribed.  Patient's medications to be e-prescribed to pharmacy of choice.  Collaboration of Care: Collaboration of Care: Medication Management AEB provider managing the patient's psychiatric medications, Psychiatrist AEB patient being followed by mental health provider, and Other provider involved in patient's care AEB patient being followed by orthopedics  Patient/Guardian was advised Release of Information must be obtained prior to any record release in order to collaborate their care with an outside provider. Patient/Guardian was advised if they have not already done so to contact the registration department to sign all necessary forms in order for [place] to release information regarding their care.   Consent: Patient/Guardian gives verbal consent for treatment and assignment of benefits for services provided during this visit. Patient/Guardian expressed understanding and agreed to proceed.   1. Severe episode of recurrent major depressive disorder, without psychotic features (HCC)  - citalopram (CELEXA) 20 MG tablet; Take 1 tablet (20 mg total) by mouth daily.  Dispense: 30 tablet; Refill: 2  2. Insomnia due to other mental disorder  - traZODone (DESYREL) 100 MG tablet; Take 1 tablet (100 mg total) by mouth at bedtime.  Dispense: 30 tablet; Refill: 2  3. Generalized anxiety disorder  - hydrOXYzine (ATARAX)  25 MG tablet; Take 1 tablet (25 mg total) by mouth 3 (three) times daily as needed.  Dispense: 75 tablet; Refill: 1  Patient to follow up in 3 months Provider spent a total of 21 minutes with the patient/reviewing patient's chart  Meta Hatchet, PA 11/16/2021, 12:46 PM

## 2021-11-30 ENCOUNTER — Other Ambulatory Visit (HOSPITAL_COMMUNITY): Payer: Self-pay | Admitting: Physician Assistant

## 2021-11-30 DIAGNOSIS — F411 Generalized anxiety disorder: Secondary | ICD-10-CM

## 2021-12-23 ENCOUNTER — Ambulatory Visit (INDEPENDENT_AMBULATORY_CARE_PROVIDER_SITE_OTHER): Payer: Medicare HMO | Admitting: Licensed Clinical Social Worker

## 2021-12-23 DIAGNOSIS — F411 Generalized anxiety disorder: Secondary | ICD-10-CM

## 2021-12-23 DIAGNOSIS — F332 Major depressive disorder, recurrent severe without psychotic features: Secondary | ICD-10-CM | POA: Diagnosis not present

## 2021-12-23 NOTE — Progress Notes (Signed)
   THERAPIST PROGRESS NOTE  Virtual Visit via Telephone Note  I connected with Spencer Fischer on 12/23/21 at  9:00 AM EDT by telephone and verified that I am speaking with the correct person using two identifiers.  Location: Patient: [facility]  Provider: Providers Home    I discussed the limitations, risks, security and privacy concerns of performing an evaluation and management service by telephone and the availability of in person appointments. I also discussed with the patient that there may be a patient responsible charge related to this service. The patient expressed understanding and agreed to proceed.      I discussed the assessment and treatment plan with the patient. The patient was provided an opportunity to ask questions and all were answered. The patient agreed with the plan and demonstrated an understanding of the instructions.   The patient was advised to call back or seek an in-person evaluation if the symptoms worsen or if the condition fails to improve as anticipated.  I provided 40 minutes of non-face-to-face time during this encounter.   Weber Cooks, LCSW   Participation Level: Active  Behavioral Response: NAAlertDepressed  Type of Therapy: Individual Therapy    Interventions: CBT and Motivational Interviewing  Suicidal/Homicidal: Nowithout intent/plan  Therapist Response:     Pt was alert and oriented x 5. He was not observed as session was completed over the phone. He was pleasant, cooperative, and engaged well in therapy session. He presented with euthymic mood. This is pt first session with LCSW due to transfer from different counselor.   Pt reports that from now on he wants session in person but is willing to do session via phone today. He reports he wants to work on forgiveness from his father. He reports that his father was not apart of his life. When pt did engage with his father he passed away 1 week later. Pt reports working on himself.  He states that he found God when he got out of prison for selling drugs. He states, "If I did bad for 30 years, I want to do good for 30 years". Spencer Fischer states that he has been doing good with his medication compliance and states they have been working well overall. He reports good support system from friends, family, and his religion. Pt reports that his lives each day for 4 letters "PMSG" standing for physical, Mental, Spiritual, and Goals.   Intervention/Plan: LCSW used talk psychoanalytic therapy, CBT, supportive therapy and person-centered therapy. LCSW let pt explain thoughts, feeling and emotions with nonjudgmental stance and unconditional positive regards. LCSW spoke with pt about support systems for pt and used praise and encouragement to help progress them. LCSW used empowerment to help pt further his goals of forgiveness. Plan for pt to pf/u in person moving forward  Plan: Return again in 4 weeks.  Diagnosis: No diagnosis found.  Collaboration of Care: Other none reported   Patient/Guardian was advised Release of Information must be obtained prior to any record release in order to collaborate their care with an outside provider. Patient/Guardian was advised if they have not already done so to contact the registration department to sign all necessary forms in order for [place] to release information regarding their care.   Consent: Patient/Guardian gives verbal consent for treatment and assignment of benefits for services provided during this visit. Patient/Guardian expressed understanding and agreed to proceed.   Weber Cooks, LCSW 12/23/2021

## 2021-12-27 ENCOUNTER — Other Ambulatory Visit (HOSPITAL_COMMUNITY): Payer: Self-pay | Admitting: Physician Assistant

## 2021-12-27 DIAGNOSIS — F5105 Insomnia due to other mental disorder: Secondary | ICD-10-CM

## 2022-01-10 ENCOUNTER — Ambulatory Visit (INDEPENDENT_AMBULATORY_CARE_PROVIDER_SITE_OTHER): Payer: Medicare HMO | Admitting: Licensed Clinical Social Worker

## 2022-01-10 DIAGNOSIS — F411 Generalized anxiety disorder: Secondary | ICD-10-CM

## 2022-01-10 NOTE — Progress Notes (Signed)
   THERAPIST PROGRESS NOTE  Virtual Visit via Video Note  I connected with Spencer Fischer on 01/10/22 at  4:00 PM EDT by a video enabled telemedicine application and verified that I am speaking with the correct person using two identifiers.  Location: Patient: [facility]  Provider: [facility]    I discussed the limitations of evaluation and management by telemedicine and the availability of in person appointments. The patient expressed understanding and agreed to proceed.      I discussed the assessment and treatment plan with the patient. The patient was provided an opportunity to ask questions and all were answered. The patient agreed with the plan and demonstrated an understanding of the instructions.   The patient was advised to call back or seek an in-person evaluation if the symptoms worsen or if the condition fails to improve as anticipated.  I provided 40 minutes of non-face-to-face time during this encounter.   Weber Cooks, LCSW   Participation Level: Active  Behavioral Response: CasualAlertAnxious and Depressed  Type of Therapy: Individual Therapy  Treatment Goals addressed: Decrease GAD-7 below 5 decrease PHQ-9 below a 10  ProgressTowards Goals: Progressing  Interventions: CBT and Motivational Interviewing  Summary: Spencer Fischer is a 56 y.o. male who presents with euthymic mood\affect.  Patient was pleasant, cooperative, maintained good eye contact.  Engagement in therapy session was dressed casually.  Patient switched appointment from in person to virtual on the same day and LCSW was agreeable to it.  Patient request moving forward that all his appointments are now made virtual.  LCSW sent request over to administrative assistance to make changes.  Patient reports that he has been living his life by "P, M, S, G".  This stands for physical, mental, spiritual, and goal driven.  Patient reports that he reminds him of this daily to help keep him on  track.  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  LCSW utilized solution focused therapy to discussed options such as having a "down payment of 50%".  Other options for patient that was discussed in today's session was to pay full amount before service can be administered.  Suicidal/Homicidal: Nowithout intent/plan  Therapist Response:    LCSW administered GAD-7.  LCSW administered PHQ-9.  Both improved since last PHQ-9 and GAD-7 were administered.  LCSW went over scores of GAD-7 and PHQ-9.  LCSW spoke about the importance of taking medications as prescribed.  Plan is for patient to continue to utilize acronym of "P, R, S, G".  Diagnosis: No diagnosis found.  Collaboration of Care: Other non today   Patient/Guardian was advised Release of Information must be obtained prior to any record release in order to collaborate their care with an outside provider. Patient/Guardian was advised if they have not already done so to contact the registration department to sign all necessary forms in order for [place] to release information regarding their care.   Consent: Patient/Guardian gives verbal consent for treatment and assignment of benefits for services provided during this visit. Patient/Guardian expressed understanding and agreed to proceed.   Weber Cooks, LCSW 01/10/2022

## 2022-01-24 ENCOUNTER — Other Ambulatory Visit (HOSPITAL_COMMUNITY): Payer: Self-pay | Admitting: Physician Assistant

## 2022-01-24 DIAGNOSIS — F411 Generalized anxiety disorder: Secondary | ICD-10-CM

## 2022-01-25 ENCOUNTER — Ambulatory Visit (HOSPITAL_COMMUNITY): Payer: Medicare HMO | Admitting: Licensed Clinical Social Worker

## 2022-01-25 ENCOUNTER — Encounter (HOSPITAL_COMMUNITY): Payer: Self-pay

## 2022-02-14 ENCOUNTER — Ambulatory Visit (INDEPENDENT_AMBULATORY_CARE_PROVIDER_SITE_OTHER): Payer: Medicare HMO | Admitting: Physician Assistant

## 2022-02-14 ENCOUNTER — Other Ambulatory Visit (HOSPITAL_COMMUNITY): Payer: Self-pay | Admitting: Physician Assistant

## 2022-02-14 ENCOUNTER — Encounter (HOSPITAL_COMMUNITY): Payer: Self-pay | Admitting: Physician Assistant

## 2022-02-14 DIAGNOSIS — F5105 Insomnia due to other mental disorder: Secondary | ICD-10-CM

## 2022-02-14 DIAGNOSIS — F17210 Nicotine dependence, cigarettes, uncomplicated: Secondary | ICD-10-CM

## 2022-02-14 DIAGNOSIS — F17200 Nicotine dependence, unspecified, uncomplicated: Secondary | ICD-10-CM

## 2022-02-14 DIAGNOSIS — F332 Major depressive disorder, recurrent severe without psychotic features: Secondary | ICD-10-CM | POA: Diagnosis not present

## 2022-02-14 DIAGNOSIS — F411 Generalized anxiety disorder: Secondary | ICD-10-CM

## 2022-02-14 MED ORDER — TRAZODONE HCL 100 MG PO TABS: 100.0000 mg | ORAL_TABLET | Freq: Every day | ORAL | 3 refills | Status: DC

## 2022-02-14 MED ORDER — CITALOPRAM HYDROBROMIDE 20 MG PO TABS: 20.0000 mg | ORAL_TABLET | Freq: Every day | ORAL | 3 refills | Status: DC

## 2022-02-14 MED ORDER — HYDROXYZINE HCL 25 MG PO TABS: 25.0000 mg | ORAL_TABLET | Freq: Three times a day (TID) | ORAL | 2 refills | Status: DC | PRN

## 2022-02-14 NOTE — Progress Notes (Signed)
BH MD/PA/NP OP Progress Note  Virtual Visit via Telephone Note  I connected with Spencer Fischer on 02/14/22 at  9:30 AM EDT by telephone and verified that I am speaking with the correct person using two identifiers.  Location: Patient: Home Provider: Clinic   I discussed the limitations, risks, security and privacy concerns of performing an evaluation and management service by telephone and the availability of in person appointments. I also discussed with the patient that there may be a patient responsible charge related to this service. The patient expressed understanding and agreed to proceed.  Follow Up Instructions:  I discussed the assessment and treatment plan with the patient. The patient was provided an opportunity to ask questions and all were answered. The patient agreed with the plan and demonstrated an understanding of the instructions.   The patient was advised to call back or seek an in-person evaluation if the symptoms worsen or if the condition fails to improve as anticipated.  I provided 12 minutes of non-face-to-face time during this encounter.  Meta Hatchet, PA    02/14/2022 1:51 PM TERION HEDMAN  MRN:  [id number]  Chief Complaint:  No chief complaint on file.  HPI:   Spencer Fischer. Ruttan is a 56 year old male with a past psychiatric history significant for major depressive disorder, insomnia, and generalized anxiety disorder who presents to [facility] via virtual telephone visit for follow-up and medication management.  Patient is currently being managed on the following medications:  Celexa 20 mg daily Trazodone 100 mg at bedtime Hydroxyzine 25 mg 3 times daily as needed  Patient denies any issues or concerns regarding his current medication regimen.  He denies experiencing any adverse side effects.  Patient further denies the need for dosage adjustments at this time and is requesting refills on all of his medication following  the conclusion of the encounter.  Patient reports that he continues to take his medications every day but has been experiencing some depression related to the recent loss of a family member.  Patient endorses the following depressive symptoms: fatigue, poor appetite, decreased interest in activities, and feelings of sadness.  Patient denies anxiety stating that he has been cool and has been trying to relax.  Patient denies any new stressors at this time.  Patient is alert and oriented x4, pleasant, calm, cooperative, and fully engaged in conversation during the encounter.  Patient states that he is in a great mood.  Patient denies suicidal or homicidal ideations.  He further denies auditory or visual hallucinations and does not appear to be responding to internal/external stimuli.  Patient endorses fair sleep and receives on average 6 hours of sleep each night.  He states that his sleep is often disrupted by having to go to the bathroom throughout the night.  Patient endorses good appetite and eats on average 1-2 meals per day.  Patient denies alcohol consumption and illicit drug use.  Patient endorses tobacco use and smokes on average 5 cigarettes/day.  Visit Diagnosis:    ICD-10-CM   1. Current smoker  F17.200     2. Generalized anxiety disorder  F41.1 hydrOXYzine (ATARAX) 25 MG tablet    3. Severe episode of recurrent major depressive disorder, without psychotic features (HCC)  F33.2 citalopram (CELEXA) 20 MG tablet    4. Insomnia due to other mental disorder  F51.05 traZODone (DESYREL) 100 MG tablet   F99       Past Psychiatric History:  Generalized anxiety disorder Major  depressive disorder  Past Medical History:  Past Medical History:  Diagnosis Date   Anxiety    Arthritis    Carpal tunnel syndrome    Carpal tunnel syndrome, left    Depression    GERD (gastroesophageal reflux disease)    Hypertension    Sleep apnea    wears CPAP    Past Surgical History:  Procedure Laterality  Date   BILATERAL ANTERIOR TOTAL HIP ARTHROPLASTY Bilateral 04/06/2015   Procedure: BILATERAL ANTERIOR TOTAL HIP ARTHROPLASTY;  Surgeon: Kathryne Hitch, MD;  Location: [facility] OR;  Service: Orthopedics;  Laterality: Bilateral;   CARPAL TUNNEL RELEASE Left 08/24/2016   Procedure: LEFT OPEN CARPAL TUNNEL RELEASE;  Surgeon: Kathryne Hitch, MD;  Location: [facility] OR;  Service: Orthopedics;  Laterality: Left;   CARPAL TUNNEL RELEASE Right 05/11/2021   Procedure: RIGHT CARPAL TUNNEL RELEASE;  Surgeon: Marlyne Beards, MD;  Location: [facility];  Service: Orthopedics;  Laterality: Right;   KNEE SURGERY Left    WISDOM TOOTH EXTRACTION      Family Psychiatric History:  Patient denies a family history of psychiatric illness  Family History:  Family History  Problem Relation Age of Onset   Hypertension Mother    Hypertension Sister    Hypertension Brother     Social History:  Social History   Socioeconomic History   Marital status: Single    Spouse name: Not on file   Number of children: Not on file   Years of education: Not on file   Highest education level: Not on file  Occupational History   Not on file  Tobacco Use   Smoking status: Every Day    Packs/day: 0.25    Types: Cigarettes   Smokeless tobacco: Never  Substance and Sexual Activity   Alcohol use: Not Currently    Comment: 4- 40 ounces of beer/week   Drug use: No   Sexual activity: Not on file  Other Topics Concern   Not on file  Social History Narrative   Not on file   Social Determinants of Health   Financial Resource Strain: Not on file  Food Insecurity: Not on file  Transportation Needs: Not on file  Physical Activity: Not on file  Stress: Not on file  Social Connections: Not on file    Allergies: No Known Allergies  Metabolic Disorder Labs: Lab Results  Component Value Date   HGBA1C 5.62f 10/01/2015   No results found for: "PROLACTIN" No results found for: "CHOL", "TRIG",  "HDL", "CHOLHDL", "VLDL", "LDLCALC" Lab Results  Component Value Date   TSH 3.307 04/16/2015    Therapeutic Level Labs: No results found for: "LITHIUM" No results found for: "VALPROATE" No results found for: "CBMZ"  Current Medications: Current Outpatient Medications  Medication Sig Dispense Refill   baclofen (LIORESAL) 20 MG tablet Take 20 mg by mouth 3 (three) times daily as needed.     citalopram (CELEXA) 20 MG tablet Take 1 tablet (20 mg total) by mouth daily. 30 tablet 3   hydrOXYzine (ATARAX) 25 MG tablet Take 1 tablet (25 mg total) by mouth 3 (three) times daily as needed. 75 tablet 2   lisinopril (ZESTRIL) 20 MG tablet Take 20 mg by mouth daily.     omeprazole (PRILOSEC) 40 MG capsule Take 40 mg by mouth daily.     SUTAB [phone] MG TABS SMARTSIG:24 Tablet(s) By Mouth     traZODone (DESYREL) 100 MG tablet Take 1 tablet (100 mg total) by mouth at bedtime. 30 tablet  3   No current facility-administered medications for this visit.     Musculoskeletal: Strength & Muscle Tone: Unable to assess due to telemedicine visit Gait & Station: Unable to assess due to telemedicine visit Patient leans: Unable to assess due to telemedicine visit  Psychiatric Specialty Exam: Review of Systems  Psychiatric/Behavioral:  Positive for sleep disturbance. Negative for decreased concentration, dysphoric mood, hallucinations, self-injury and suicidal ideas. The patient is not nervous/anxious and is not hyperactive.     There were no vitals taken for this visit.There is no height or weight on file to calculate BMI.  General Appearance: Unable to assess due to telemedicine visit  Eye Contact:  Unable to assess due to telemedicine visit  Speech:  Clear and Coherent and Normal Rate  Volume:  Normal  Mood:  Anxious and Euthymic  Affect:  Congruent  Thought Process:  Coherent and Descriptions of Associations: Intact  Orientation:  Full (Time, Place, and Person)  Thought Content: WDL    Suicidal Thoughts:  No  Homicidal Thoughts:  No  Memory:  Immediate;   Good Recent;   Good Remote;   Good  Judgement:  Good  Insight:  Fair  Psychomotor Activity:  Normal  Concentration:  Concentration: Good and Attention Span: Good  Recall:  Good  Fund of Knowledge: Good  Language: Good  Akathisia:  No  Handed:  Right  AIMS (if indicated): not done  Assets:  Communication Skills Desire for Improvement Housing Social Support Transportation  ADL's:  Intact  Cognition: WNL  Sleep:  Fair   Screenings: GAD-7    Garment/textile technologist Visit from 02/14/2022 in [facility] Counselor from 01/10/2022 in [facility] Office Visit from 11/15/2021 in [facility] Office Visit from 09/15/2021 in [facility] Video Visit from 06/21/2021 in [facility]  Total GAD-7 Score 14 9 11 10 14       PHQ2-9    Flowsheet Row Office Visit from 02/14/2022 in [facility] Counselor from 01/10/2022 in [facility] Office Visit from 11/15/2021 in [facility] Office Visit from 09/15/2021 in [facility] Video Visit from 06/21/2021 in [facility]  PHQ-2 Total Score 4 0 4 3 4   PHQ-9 Total Score 15 5 8 15 12       Flowsheet Row Office Visit from 02/14/2022 in [facility] Office Visit from 11/15/2021 in [facility] ED from 11/01/2021 in [facility]  C-SSRS RISK CATEGORY No Risk No Risk No Risk        Assessment and Plan:   Mikhail Hallenbeck. Rands is a 56 year old male with a past psychiatric history significant for major depressive disorder, insomnia, and generalized anxiety disorder who presents to [facility] via virtual  telephone visit for follow-up and medication management.  Patient denies anxiety but does endorse some depressive symptoms related to the recent loss of family member.  Despite his depressive symptoms, patient denies any issues or concerns regarding his current medication regimen.  Patient denies the need for dosage adjustments at this time and is requesting refills on all of his medications following the conclusion of the encounter.  Collaboration of Care: Collaboration of Care: Medication Management AEB provider managing the patient's psychiatric medications, Psychiatrist AEB patient being followed by mental health provider, and Other provider involved in patient's  care AEB patient being followed by orthopedics  Patient/Guardian was advised Release of Information must be obtained prior to any record release in order to collaborate their care with an outside provider. Patient/Guardian was advised if they have not already done so to contact the registration department to sign all necessary forms in order for [place] to release information regarding their care.   Consent: Patient/Guardian gives verbal consent for treatment and assignment of benefits for services provided during this visit. Patient/Guardian expressed understanding and agreed to proceed.   1. Current smoker   2. Generalized anxiety disorder  - hydrOXYzine (ATARAX) 25 MG tablet; Take 1 tablet (25 mg total) by mouth 3 (three) times daily as needed.  Dispense: 75 tablet; Refill: 2  3. Severe episode of recurrent major depressive disorder, without psychotic features (HCC)  - citalopram (CELEXA) 20 MG tablet; Take 1 tablet (20 mg total) by mouth daily.  Dispense: 30 tablet; Refill: 3  4. Insomnia due to other mental disorder  - traZODone (DESYREL) 100 MG tablet; Take 1 tablet (100 mg total) by mouth at bedtime.  Dispense: 30 tablet; Refill: 3  Patient to follow up in 3 months Provider spent a total of 12 minutes with the patient/reviewing  patient's chart  Meta Hatchet, PA 02/14/2022, 1:51 PM

## 2022-02-15 ENCOUNTER — Ambulatory Visit (HOSPITAL_COMMUNITY): Payer: Medicare HMO | Admitting: Licensed Clinical Social Worker

## 2022-02-15 ENCOUNTER — Encounter (HOSPITAL_COMMUNITY): Payer: Self-pay

## 2022-03-08 ENCOUNTER — Ambulatory Visit (INDEPENDENT_AMBULATORY_CARE_PROVIDER_SITE_OTHER): Payer: Medicare HMO | Admitting: Licensed Clinical Social Worker

## 2022-03-08 DIAGNOSIS — F411 Generalized anxiety disorder: Secondary | ICD-10-CM

## 2022-03-08 DIAGNOSIS — F332 Major depressive disorder, recurrent severe without psychotic features: Secondary | ICD-10-CM

## 2022-03-08 NOTE — Patient Instructions (Signed)
30

## 2022-03-08 NOTE — Progress Notes (Addendum)
   THERAPIST PROGRESS NOTE  Session Time: 86  Virtual Visit via Video Note  I connected with Priscille Heidelberg Weyenberg on 03/08/22 at 11:00 AM EDT by a video enabled telemedicine application and verified that I am speaking with the correct person using two identifiers.  Location: Patient: [facility]  Provider: Providers Home    I discussed the limitations of evaluation and management by telemedicine and the availability of in person appointments. The patient expressed understanding and agreed to proceed.  History of Present Illness:    Observations/Objective:   Assessment and Plan:   Follow Up Instructions:    I discussed the assessment and treatment plan with the patient. The patient was provided an opportunity to ask questions and all were answered. The patient agreed with the plan and demonstrated an understanding of the instructions.   The patient was advised to call back or seek an in-person evaluation if the symptoms worsen or if the condition fails to improve as anticipated.  I provided 30 minutes of non-face-to-face time during this encounter.   Dory Horn, LCSW   Participation Level: Active  Behavioral Response: CasualAlertAnxious and Depressed  Type of Therapy: Individual Therapy  Treatment Goals addressed: Decrease GAD-7 below 5 and decreased PHQ-9 below and  ProgressTowards Goals: Progressing  Interventions: CBT and Motivational Interviewing  Summary: JANSEL VONSTEIN is a 56 y.o. male who presents with anxious and depressed mood\affect.  Patient was pleasant, cooperative, maintained good eye contact.  He engaged well in therapy session was dressed casually.  Victorio was alert and oriented x5.  Patient started out session by stating his "PMSG" this stands for physical, mental, spiritual, and goal.  Patient reports physically he is doing well.  Mentally he is doing good.  Spiritually he is "God fearing".  And goal is to develop a hot dog stand in [facility].  Patient reports that he was looking into different avenues to assess the obtain ability of owning a hot dog stand.  Patient reports no stressors at this time.  Patient reports mental health is doing well and that no symptoms are present at this time.  Suicidal/Homicidal: Nowithout intent/plan  Therapist Response:     Interventions/Plan: LCSW utilized supportive therapy in today's session for praise and encouragement.  LCSW utilized person centered therapy for empowerment and unconditional positive regard for nonjudgmental stance.  LCSW administered the GAD-7.  LCSW administered PHQ-9.  LCSW reviewed PHQ-9 and GAD-7 scores with patient.  LCSW educated patient on taking medications as prescribed.  LCSW educated patient on symptoms of depression and anxiety.  Plan for patient is to continue to utilize acronym for "PMSG".    Plan: Return again in 3 weeks.  Diagnosis: No diagnosis found.  Collaboration of Care: Other None today   Patient/Guardian was advised Release of Information must be obtained prior to any record release in order to collaborate their care with an outside provider. Patient/Guardian was advised if they have not already done so to contact the registration department to sign all necessary forms in order for [place] to release information regarding their care.   Consent: Patient/Guardian gives verbal consent for treatment and assignment of benefits for services provided during this visit. Patient/Guardian expressed understanding and agreed to proceed.   Dory Horn, LCSW 03/08/2022

## 2022-04-11 ENCOUNTER — Other Ambulatory Visit (HOSPITAL_COMMUNITY): Payer: Self-pay | Admitting: Physician Assistant

## 2022-04-11 DIAGNOSIS — F411 Generalized anxiety disorder: Secondary | ICD-10-CM

## 2022-04-20 ENCOUNTER — Ambulatory Visit (HOSPITAL_COMMUNITY): Payer: Medicare HMO | Admitting: Licensed Clinical Social Worker

## 2022-05-11 ENCOUNTER — Ambulatory Visit (INDEPENDENT_AMBULATORY_CARE_PROVIDER_SITE_OTHER): Payer: Medicare HMO | Admitting: Licensed Clinical Social Worker

## 2022-05-11 DIAGNOSIS — F332 Major depressive disorder, recurrent severe without psychotic features: Secondary | ICD-10-CM

## 2022-05-11 DIAGNOSIS — F411 Generalized anxiety disorder: Secondary | ICD-10-CM | POA: Diagnosis not present

## 2022-05-11 NOTE — Plan of Care (Signed)
Pt verbally agreeable to plan  

## 2022-05-11 NOTE — Progress Notes (Signed)
   THERAPIST PROGRESS NOTE  Virtual Visit via Video Note  I connected with Spencer Fischer on 05/11/22 at 11:00 AM EST by a video enabled telemedicine application and verified that I am speaking with the correct person using two identifiers.  Location: Patient: Spencer Fischer  Provider: Providers Home    I discussed the limitations of evaluation and management by telemedicine and the availability of in person appointments. The patient expressed understanding and agreed to proceed.     I discussed the assessment and treatment plan with the patient. The patient was provided an opportunity to ask questions and all were answered. The patient agreed with the plan and demonstrated an understanding of the instructions.   The patient was advised to call back or seek an in-person evaluation if the symptoms worsen or if the condition fails to improve as anticipated.  I provided 30 minutes of non-face-to-face time during this encounter.   Weber Cooks, LCSW   Participation Level: Active  Behavioral Response: CasualAlertAnxious  Type of Therapy: Individual Therapy  Treatment Goals addressed: Treatment plan was updated in todays session. It was reviewed with pt and he was verbally agreeable.   ProgressTowards Goals: Progressing  Interventions: CBT, Motivational Interviewing, and Supportive   Suicidal/Homicidal: Nowithout intent/plan  Therapist Response:    Pt was alert and oriented x 5. He was dressed casually and engaged well in therapy session. He presented with depressed and anxious mood/affect. He was pleasant, cooperative and maintained good eye contact.  Pt endorse symptoms for insomnia. He reports that his trazadone has not been working well. LCSW directed him to bring this up at his next appointment or he could call the nursing line to be advised on next steps. Quan states he lives his life by "PMSG" which stands for physical, mental, spiritual, and goal driven. Pt reports he  is a good place in all three but states his mental could be improved by not putting too high of expectations on himself and not letting pressure get to him from his family.  Interventions/Plan:  LCSW administered a GAD-7. LCSW reviewed scores with pt. LCSW notes a decrease in anxiety by 1 point. LCSW used psychoanalytic therapy for pt to express thoughts, feeling, and emotions in session. LCSW used praise and encouragement for supportive therapy.   Plan: Return again in 4 weeks.  Diagnosis: Generalized anxiety disorder  Severe episode of recurrent major depressive disorder, without psychotic features (HCC)  Collaboration of Care: Other None today   Patient/Guardian was advised Release of Information must be obtained prior to any record release in order to collaborate their care with an outside provider. Patient/Guardian was advised if they have not already done so to contact the registration department to sign all necessary forms in order for [place] to release information regarding their care.   Consent: Patient/Guardian gives verbal consent for treatment and assignment of benefits for services provided during this visit. Patient/Guardian expressed understanding and agreed to proceed.   Weber Cooks, LCSW 05/11/2022

## 2022-05-16 ENCOUNTER — Encounter (HOSPITAL_COMMUNITY): Payer: Self-pay | Admitting: Student in an Organized Health Care Education/Training Program

## 2022-05-16 ENCOUNTER — Telehealth (INDEPENDENT_AMBULATORY_CARE_PROVIDER_SITE_OTHER): Payer: Medicare HMO | Admitting: Student in an Organized Health Care Education/Training Program

## 2022-05-16 ENCOUNTER — Telehealth (HOSPITAL_COMMUNITY): Payer: Medicare HMO | Admitting: Physician Assistant

## 2022-05-16 ENCOUNTER — Telehealth (HOSPITAL_COMMUNITY): Payer: Medicare HMO | Admitting: Student in an Organized Health Care Education/Training Program

## 2022-05-16 DIAGNOSIS — F5105 Insomnia due to other mental disorder: Secondary | ICD-10-CM | POA: Diagnosis not present

## 2022-05-16 DIAGNOSIS — F411 Generalized anxiety disorder: Secondary | ICD-10-CM

## 2022-05-16 DIAGNOSIS — F99 Mental disorder, not otherwise specified: Secondary | ICD-10-CM

## 2022-05-16 DIAGNOSIS — F332 Major depressive disorder, recurrent severe without psychotic features: Secondary | ICD-10-CM

## 2022-05-16 MED ORDER — CITALOPRAM HYDROBROMIDE 20 MG PO TABS: 20.0000 mg | ORAL_TABLET | Freq: Every day | ORAL | 3 refills | Status: DC

## 2022-05-16 MED ORDER — HYDROXYZINE HCL 25 MG PO TABS: 25.0000 mg | ORAL_TABLET | Freq: Three times a day (TID) | ORAL | 3 refills | Status: DC | PRN

## 2022-05-16 MED ORDER — TRAZODONE HCL 100 MG PO TABS: 100.0000 mg | ORAL_TABLET | Freq: Every day | ORAL | 3 refills | Status: DC

## 2022-05-16 NOTE — Progress Notes (Signed)
BH MD/PA/NP OP Progress Note  05/16/2022 10:18 AM DERK DOUBEK  MRN:  [id number]  Chief Complaint:  Chief Complaint  Patient presents with   Follow-up   HPI:   Virtual Visit via Telephone Note  I connected with Spencer Fischer on 05/16/22 at 10:00 AM EST by telephone and verified that I am speaking with the correct person using two identifiers.  Patient was not able to get a video visit working despite getting assistance from his in-home nurse.  Location: Patient: Home, nurse left prior to appointment officially starting Provider: Office   I discussed the limitations, risks, security and privacy concerns of performing an evaluation and management service by telephone and the availability of in person appointments. I also discussed with the patient that there may be a patient responsible charge related to this service. The patient expressed understanding and agreed to proceed.   History of Present Illness: Spencer Fischer is a 56 year old male with a past psychiatric history significant for major depressive disorder, insomnia, and generalized anxiety disorder who presents to [facility] via virtual telephone visit for follow-up and medication management.  Patient is currently being managed on the following medications:  Celexa 20 mg daily Trazodone 100 mg at bedtime Hydroxyzine 25 mg 3 times daily as needed     On assessment today patient reports that he is in a "cheerful mood" and reports that his anxiety has been "all right."  Patient reports that overall he feels his medication has been beneficial and has significantly improved.  Patient endorses he has a positive outlook on life and is reliant on his religious beliefs to help him cope.  Patient endorses that these believes help him worry less.  Patient reports his appetite has been "great" and endorses that his sleep is overall okay, with frequent nighttime awakenings due to bladder issues for which  she is seeing a urologist for.  Patient denies having any adverse side effects from his medication.  Patient denies SI, HI and AVH on assessment today.    I discussed the assessment and treatment plan with the patient. The patient was provided an opportunity to ask questions and all were answered. The patient agreed with the plan and demonstrated an understanding of the instructions.   The patient was advised to call back or seek an in-person evaluation if the symptoms worsen or if the condition fails to improve as anticipated.  I provided 20 minutes of non-face-to-face time during this encounter.   Bobbye Morton, MD    Visit Diagnosis:    ICD-10-CM   1. Severe episode of recurrent major depressive disorder, without psychotic features (HCC)  F33.2 citalopram (CELEXA) 20 MG tablet    2. Generalized anxiety disorder  F41.1 hydrOXYzine (ATARAX) 25 MG tablet    3. Insomnia due to other mental disorder  F51.05 traZODone (DESYREL) 100 MG tablet   F99       Past Psychiatric History:   Generalized anxiety disorder Major depressive disorder  Past Medical History:  Past Medical History:  Diagnosis Date   Anxiety    Arthritis    Carpal tunnel syndrome    Carpal tunnel syndrome, left    Depression    GERD (gastroesophageal reflux disease)    Hypertension    Sleep apnea    wears CPAP    Past Surgical History:  Procedure Laterality Date   BILATERAL ANTERIOR TOTAL HIP ARTHROPLASTY Bilateral 04/06/2015   Procedure: BILATERAL ANTERIOR TOTAL HIP ARTHROPLASTY;  Surgeon:  Kathryne Hitch, MD;  Location: [facility] OR;  Service: Orthopedics;  Laterality: Bilateral;   CARPAL TUNNEL RELEASE Left 08/24/2016   Procedure: LEFT OPEN CARPAL TUNNEL RELEASE;  Surgeon: Kathryne Hitch, MD;  Location[facility] OR;  Service: Orthopedics;  Laterality: Left;   CARPAL TUNNEL RELEASE Right 05/11/2021   Procedure: RIGHT CARPAL TUNNEL RELEASE;  Surgeon: Marlyne Beards, MD;  Location: [facility];  Service: Orthopedics;  Laterality: Right;   KNEE SURGERY Left    WISDOM TOOTH EXTRACTION      Family Psychiatric History: Patient denies a family history of psychiatric illness   Family History:  Family History  Problem Relation Age of Onset   Hypertension Mother    Hypertension Sister    Hypertension Brother     Social History:  Social History   Socioeconomic History   Marital status: Single    Spouse name: Not on file   Number of children: Not on file   Years of education: Not on file   Highest education level: Not on file  Occupational History   Not on file  Tobacco Use   Smoking status: Every Day    Packs/day: 0.25    Types: Cigarettes   Smokeless tobacco: Never  Substance and Sexual Activity   Alcohol use: Not Currently    Comment: 4- 40 ounces of beer/week   Drug use: No   Sexual activity: Not on file  Other Topics Concern   Not on file  Social History Narrative   Not on file   Social Determinants of Health   Financial Resource Strain: Not on file  Food Insecurity: Not on file  Transportation Needs: Not on file  Physical Activity: Not on file  Stress: Not on file  Social Connections: Not on file    Allergies: No Known Allergies  Metabolic Disorder Labs: Lab Results  Component Value Date   HGBA1C 5.33f 10/01/2015   No results found for: "PROLACTIN" No results found for: "CHOL", "TRIG", "HDL", "CHOLHDL", "VLDL", "LDLCALC" Lab Results  Component Value Date   TSH 3.307 04/16/2015    Therapeutic Level Labs: No results found for: "LITHIUM" No results found for: "VALPROATE" No results found for: "CBMZ"  Current Medications: Current Outpatient Medications  Medication Sig Dispense Refill   baclofen (LIORESAL) 20 MG tablet Take 20 mg by mouth 3 (three) times daily as needed.     citalopram (CELEXA) 20 MG tablet Take 1 tablet (20 mg total) by mouth daily. 30 tablet 3   hydrOXYzine (ATARAX) 25 MG tablet Take 1 tablet (25 mg total) by mouth  3 (three) times daily as needed. 75 tablet 3   lisinopril (ZESTRIL) 20 MG tablet Take 20 mg by mouth daily.     omeprazole (PRILOSEC) 40 MG capsule Take 40 mg by mouth daily.     SUTAB [phone] MG TABS SMARTSIG:24 Tablet(s) By Mouth     traZODone (DESYREL) 100 MG tablet Take 1 tablet (100 mg total) by mouth at bedtime. 30 tablet 3   No current facility-administered medications for this visit.     Musculoskeletal: Defer  Psychiatric Specialty Exam: Review of Systems  Psychiatric/Behavioral:  Negative for dysphoric mood, hallucinations and suicidal ideas.     There were no vitals taken for this visit.There is no height or weight on file to calculate BMI.  General Appearance: NA  Eye Contact:  NA  Speech:  Clear and Coherent  Volume:  Normal  Mood:  Euthymic  Affect:  NA  Thought Process:  Coherent  Orientation:  Full (Time, Place, and Person)  Thought Content: Logical   Suicidal Thoughts:  No  Homicidal Thoughts:  No  Memory:  Immediate;   Good Recent;   Fair  Judgement:  Good  Insight:  Fair  Psychomotor Activity:  NA  Concentration:  Concentration: Fair  Recall:  NA  Fund of Knowledge: Fair  Language: Good  Akathisia:  No  Handed:    AIMS (if indicated): not done  Assets:  Communication Skills Desire for Improvement Housing Resilience Social Support  ADL's:  Impaired  Cognition: WNL  Sleep:  Fair   Screenings: GAD-7    Advertising copywriter from 05/11/2022 in [facility] Counselor from 03/08/2022 in [facility] Office Visit from 02/14/2022 in [facility] Counselor from 01/10/2022 in [facility] Office Visit from 11/15/2021 in [facility]  Total GAD-7 Score 9 10 14 9 11       PHQ2-9    Flowsheet Row Counselor from 03/08/2022 in [facility] Office Visit from 02/14/2022 in [facility] Counselor from 01/10/2022 in [facility] Office Visit from 11/15/2021 in [facility] Office Visit from 09/15/2021 in [facility]  PHQ-2 Total Score 3 4 0 4 3  PHQ-9 Total Score 11 15 5 8 15       Flowsheet Row Office Visit from 02/14/2022 in [facility] Office Visit from 11/15/2021 in [facility] ED from 11/01/2021 in [facility]  C-SSRS RISK CATEGORY No Risk No Risk No Risk        Assessment and Plan:   Based on assessment today, patient appears to be doing well and is currently stable at this regimen.  We will follow-up with patient in approximately 3 months.  Patient has an in-home nursing is assisting him.  MDD GAD Insomnia - Continue Celexa 20 mg - Continue hydroxyzine 25 mg 3 times daily as needed - Continue trazodone 100 mg nightly as needed  Follow-up in approximately 3 months.  Collaboration of Care: Collaboration of Care:   Patient/Guardian was advised Release of Information must be obtained prior to any record release in order to collaborate their care with an outside provider. Patient/Guardian was advised if they have not already done so to contact the registration department to sign all necessary forms in order for 11/03/2021 to release information regarding their care.   Consent: Patient/Guardian gives verbal consent for treatment and assignment of benefits for services provided during this visit. Patient/Guardian expressed understanding and agreed to proceed.   PGY-3 [place], MD 05/16/2022, 10:18 AM

## 2022-05-24 ENCOUNTER — Other Ambulatory Visit (HOSPITAL_COMMUNITY): Payer: Self-pay | Admitting: Physician Assistant

## 2022-05-24 DIAGNOSIS — F411 Generalized anxiety disorder: Secondary | ICD-10-CM

## 2022-06-14 ENCOUNTER — Ambulatory Visit (HOSPITAL_COMMUNITY): Payer: Medicare HMO | Admitting: Licensed Clinical Social Worker

## 2022-06-14 ENCOUNTER — Encounter (HOSPITAL_COMMUNITY): Payer: Self-pay

## 2022-08-20 NOTE — Progress Notes (Unsigned)
[place] MD/PA/NP OP Progress Note  08/20/2022 2:19 PM SENDER STARR  MRN:  [id number]  Chief Complaint: No chief complaint on file.  Virtual Visit via Video Note  I connected with Spencer Fischer on 08/20/22 at  1:00 PM EDT by a video enabled telemedicine application and verified that I am speaking with the correct person using two identifiers.  Location: Patient: *** Provider: Office   I discussed the limitations of evaluation and management by telemedicine and the availability of in person appointments. The patient expressed understanding and agreed to proceed.  History of Present Illness: Spencer Fischer is a 57 year old male with a past psychiatric history significant for major depressive disorder, insomnia, and generalized anxiety disorder who presents to [facility] via virtual telephone visit for follow-up and medication management.  Patient is currently being managed on the following medications:  Continue Celexa 20 mg Continue hydroxyzine 25 mg 3 times daily as needed Continue trazodone 100 mg nightly as needed   (Last visit, stable   I discussed the assessment and treatment plan with the patient. The patient was provided an opportunity to ask questions and all were answered. The patient agreed with the plan and demonstrated an understanding of the instructions.   The patient was advised to call back or seek an in-person evaluation if the symptoms worsen or if the condition fails to improve as anticipated.  I provided *** minutes of non-face-to-face time during this encounter.   Freida Busman, MD  Visit Diagnosis: No diagnosis found.  Past Psychiatric History: Generalized anxiety disorder Major depressive disorder  Last visit:  05/2022- No changes made to meds, dong well stable.   Past Medical History:  Past Medical History:  Diagnosis Date   Anxiety    Arthritis    Carpal tunnel syndrome    Carpal tunnel syndrome, left     Depression    GERD (gastroesophageal reflux disease)    Hypertension    Sleep apnea    wears CPAP    Past Surgical History:  Procedure Laterality Date   BILATERAL ANTERIOR TOTAL HIP ARTHROPLASTY Bilateral 04/06/2015   Procedure: BILATERAL ANTERIOR TOTAL HIP ARTHROPLASTY;  Surgeon: Mcarthur Rossetti, MD;  Location: [place];  Service: Orthopedics;  Laterality: Bilateral;   CARPAL TUNNEL RELEASE Left 08/24/2016   Procedure: LEFT OPEN CARPAL TUNNEL RELEASE;  Surgeon: Mcarthur Rossetti, MD;  Location: [place];  Service: Orthopedics;  Laterality: Left;   CARPAL TUNNEL RELEASE Right 05/11/2021   Procedure: RIGHT CARPAL TUNNEL RELEASE;  Surgeon: Sherilyn Cooter, MD;  Location: [place];  Service: Orthopedics;  Laterality: Right;   KNEE SURGERY Left    WISDOM TOOTH EXTRACTION      Family Psychiatric History: ***  Family History:  Family History  Problem Relation Age of Onset   Hypertension Mother    Hypertension Sister    Hypertension Brother     Social History:  Social History   Socioeconomic History   Marital status: Single    Spouse name: Not on file   Number of children: Not on file   Years of education: Not on file   Highest education level: Not on file  Occupational History   Not on file  Tobacco Use   Smoking status: Every Day    Packs/day: 0.25    Types: Cigarettes   Smokeless tobacco: Never  Substance and Sexual Activity   Alcohol use: Not Currently    Comment: 4- 40 ounces of beer/week  Drug use: No   Sexual activity: Not on file  Other Topics Concern   Not on file  Social History Narrative   Not on file   Social Determinants of Health   Financial Resource Strain: Not on file  Food Insecurity: Not on file  Transportation Needs: Not on file  Physical Activity: Not on file  Stress: Not on file  Social Connections: Not on file    Allergies: No Known Allergies  Metabolic Disorder Labs: Lab Results  Component Value Date    HGBA1C 5.51f04/21/2017   No results found for: "PROLACTIN" No results found for: "CHOL", "TRIG", "HDL", "CHOLHDL", "VLDL", "LDLCALC" Lab Results  Component Value Date   TSH 3.307 04/16/2015    Therapeutic Level Labs: No results found for: "LITHIUM" No results found for: "VALPROATE" No results found for: "CBMZ"  Current Medications: Current Outpatient Medications  Medication Sig Dispense Refill   baclofen (LIORESAL) 20 MG tablet Take 20 mg by mouth 3 (three) times daily as needed.     citalopram (CELEXA) 20 MG tablet Take 1 tablet (20 mg total) by mouth daily. 30 tablet 3   hydrOXYzine (ATARAX) 25 MG tablet Take 1 tablet (25 mg total) by mouth 3 (three) times daily as needed. 75 tablet 3   lisinopril (ZESTRIL) 20 MG tablet Take 20 mg by mouth daily.     omeprazole (PRILOSEC) 40 MG capsule Take 40 mg by mouth daily.     SUTAB [phone] TABS SMARTSIG:24 Tablet(s) By Mouth     traZODone (DESYREL) 100 MG tablet Take 1 tablet (100 mg total) by mouth at bedtime. 30 tablet 3   No current facility-administered medications for this visit.     Musculoskeletal: Strength & Muscle Tone: {desc; muscle tone:32375} Gait & Station: {PE GAIT ED NQX:8161427Patient leans: {Patient Leans:21022755}  Psychiatric Specialty Exam: Review of Systems  There were no vitals taken for this visit.There is no height or weight on file to calculate BMI.  General Appearance: {Appearance:22683}  Eye Contact:  {BHH EYE CONTACT:22684}  Speech:  {Speech:22685}  Volume:  {Volume (PAA):22686}  Mood:  {BHH MOOD:22306}  Affect:  {Affect (PAA):22687}  Thought Process:  {Thought Process (PAA):22688}  Orientation:  {BHH ORIENTATION (PAA):22689}  Thought Content: {Thought Content:22690}   Suicidal Thoughts:  {ST/HT (PAA):22692}  Homicidal Thoughts:  {ST/HT (PAA):22692}  Memory:  {BHH MEMORY:22881}  Judgement:  {Judgement (PAA):22694}  Insight:  {Insight (PAA):22695}  Psychomotor Activity:  {Psychomotor  (PAA):22696}  Concentration:  {Concentration:21399}  Recall:  {BHH GOOD/FAIR/POOR:22877}  Fund of Knowledge: {BHH GOOD/FAIR/POOR:22877}  Language: {BHH GOOD/FAIR/POOR:22877}  Akathisia:  {BHH YES OR NO:22294}  Handed:  {Handed:22697}  AIMS (if indicated): {Desc; done/not:10129}  Assets:  {Assets (PAA):22698}  ADL's:  {BHH ATW:9249394 Cognition: {chl bhh cognition:304700322}  Sleep:  {BHH GOOD/FAIR/POOR:22877}   Screenings: GAD-7    FHealth and safety inspectorfrom [facility]/11/2022 [facility]20[facility]20[facility] Total GAD-7 Score '9 10 14 9 11      '$ PHQ2-9    Flowsheet Row Counsel[facility]  PHQ-2 Total Score 3 4 0 4 3  PHQ-9 Total Score '11 15 5 8 15      '$ Flowsheet Row Office Visit from 02/14/2022 in [facility] Office Visit from 11/15/2021 in [facility] ED from 11/01/2021 in [facility] Emergency Department at [place] No Risk No Risk No Risk        Assessment and Plan: ***  Collaboration of Care: Collaboration of Care: [facility] OP Collaboration of [id number]  Patient/Guardian was advised Release of Information must be obtained prior to any record release in order to collaborate their care with an outside provider. Patient/Guardian was advised if they have not already done so to contact  the registration department to sign all necessary forms in order for [place] to release information regarding their care.   Consent: Patient/Guardian gives verbal consent for treatment and assignment of benefits for services provided during this visit. Patient/Guardian expressed understanding and agreed to proceed.    Freida Busman, MD 08/20/2022, 2:19 PM

## 2022-08-21 ENCOUNTER — Telehealth (HOSPITAL_COMMUNITY): Payer: Medicare HMO | Admitting: Student in an Organized Health Care Education/Training Program

## 2022-08-21 ENCOUNTER — Encounter (HOSPITAL_COMMUNITY): Payer: Self-pay

## 2022-11-20 ENCOUNTER — Ambulatory Visit (INDEPENDENT_AMBULATORY_CARE_PROVIDER_SITE_OTHER): Payer: Medicare HMO | Admitting: Licensed Clinical Social Worker

## 2022-11-20 ENCOUNTER — Encounter (HOSPITAL_COMMUNITY): Payer: Self-pay | Admitting: Student in an Organized Health Care Education/Training Program

## 2022-11-20 ENCOUNTER — Ambulatory Visit (HOSPITAL_COMMUNITY): Payer: Medicare HMO | Admitting: Student in an Organized Health Care Education/Training Program

## 2022-11-20 VITALS — BP 155/70 | HR 80 | Resp 16 | Wt 268.0 lb

## 2022-11-20 DIAGNOSIS — I10 Essential (primary) hypertension: Secondary | ICD-10-CM

## 2022-11-20 DIAGNOSIS — F17200 Nicotine dependence, unspecified, uncomplicated: Secondary | ICD-10-CM

## 2022-11-20 DIAGNOSIS — F411 Generalized anxiety disorder: Secondary | ICD-10-CM

## 2022-11-20 DIAGNOSIS — F5105 Insomnia due to other mental disorder: Secondary | ICD-10-CM

## 2022-11-20 DIAGNOSIS — F332 Major depressive disorder, recurrent severe without psychotic features: Secondary | ICD-10-CM | POA: Diagnosis not present

## 2022-11-20 DIAGNOSIS — F3181 Bipolar II disorder: Secondary | ICD-10-CM

## 2022-11-20 MED ORDER — TRAZODONE HCL 100 MG PO TABS: 100.0000 mg | ORAL_TABLET | Freq: Every evening | ORAL | 3 refills | Status: DC | PRN

## 2022-11-20 MED ORDER — CITALOPRAM HYDROBROMIDE 20 MG PO TABS: 20.0000 mg | ORAL_TABLET | Freq: Every day | ORAL | 2 refills | Status: DC

## 2022-11-20 MED ORDER — NICOTINE 14 MG/24HR TD PT24: 14.0000 mg | MEDICATED_PATCH | Freq: Every day | TRANSDERMAL | 1 refills | Status: AC

## 2022-11-20 MED ORDER — ARIPIPRAZOLE 2 MG PO TABS: 2.0000 mg | ORAL_TABLET | Freq: Every day | ORAL | 2 refills | Status: DC

## 2022-11-20 MED ORDER — HYDROXYZINE HCL 25 MG PO TABS: 25.0000 mg | ORAL_TABLET | Freq: Three times a day (TID) | ORAL | 0 refills | Status: DC | PRN

## 2022-11-20 NOTE — Progress Notes (Signed)
BH MD/PA/NP OP Progress Note  11/20/2022 5:33 PM Spencer Fischer  MRN:  [id number]  Chief Complaint:  Chief Complaint  Patient presents with   Follow-up   HPI: Spencer Fischer. Ayler is a 57 year old male with a past psychiatric history significant for major depressive disorder, insomnia, and generalized anxiety disorder  and PMH of sleep apnea who presents to [facility] via virtual telephone visit for follow-up and medication management.  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 Patient reports he is compliant with is medications. He does not require Hydroxyzine everyday, but it does help with his anxiety. Patient reports that he does take his trazodone, but he has urge incontinence. Patient reports that he he may wet the bed because the hydroxyzine puts him such a deep sleep.   Patient reports that he does occasionally "get down." Patein reports that 3/7 days feeling down. Patient reports he will usually sleep the day away. Patient reports that he get 5h of sleep, but this is because he  has to get up 4x/ night due to his urinary frequency. He will be seeing a urologist tom, for intake.  Patient denies anhedonia, he enjoys cooking, watching sports. 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 Patient feels well supported and denies feeling worthless/ hopeless. Patient reports that he is thinking about going to the beach. Patient denies SI, HI, and AVH.   Patient reports that he does feel a bit irritable and he has constant anxiety. Patient reports that he struggles to complete tasks and endorses that sometimes he  starts to have some ideas of grandiosity. Patient endorses that he will feel restless, and not need more than 2h of sleep, for about 4 days. Patient reports he starts to take on a lot of projects and can't complete them. 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 Patient reports he will start to read the Bible more because he feels like it helps him calm down. He reports events like this in the last few months as well. Patient reports "I'm all over the place" multiple times throughout assessment.   Patient reports that he is working on his blood sugar and weight, he has currently loss some weight.  Visit Diagnosis:    ICD-10-CM   1. Bipolar 2 disorder (HCC)  F31.81 ARIPiprazole (ABILIFY) 2 MG tablet    2. Generalized anxiety disorder  F41.1 hydrOXYzine (ATARAX) 25 MG tablet    3. Severe episode of recurrent major depressive disorder, without psychotic features (HCC)  F33.2 citalopram (CELEXA) 20 MG tablet    4. Insomnia due to other mental disorder  F51.05 traZODone (DESYREL) 100 MG tablet   F99     5. Primary hypertension  I10     6. Current smoker  F17.200 nicotine (NICODERM CQ - DOSED IN MG/24 HOURS) 14 mg/24hr patch    7. Tobacco use disorder  F17.200 nicotine (NICODERM CQ - DOSED IN MG/24 HOURS) 14 mg/24hr patch      Past Psychiatric History: Generalized anxiety disorder Major depressive disorder  Hx of Prison and Substance abuse THC- none in 3 months Etoh- rare  Tobacco: 10 cigs/ day, patient wants to try Nrt to help him stop  Past Medical History:  Past Medical History:  Diagnosis Date   Anxiety    Arthritis    Carpal tunnel syndrome    Carpal tunnel syndrome, left    Depression    GERD (gastroesophageal reflux disease)    Hypertension    Sleep apnea    wears CPAP    Past Surgical History:  Procedure Laterality Date   BILATERAL ANTERIOR TOTAL HIP ARTHROPLASTY Bilateral 04/06/2015   Procedure: BILATERAL ANTERIOR TOTAL HIP ARTHROPLASTY;  Surgeon:  Kathryne Hitch, MD;  Location: [facility] OR;  Service: Orthopedics;  Laterality: Bilateral;   CARPAL TUNNEL RELEASE Left 08/24/2016   Procedure: LEFT OPEN CARPAL TUNNEL RELEASE;  Surgeon: Kathryne Hitch, MD;  Location[facility] OR;  Service: Orthopedics;  Laterality: Left;   CARPAL TUNNEL RELEASE Right 05/11/2021   Procedure: RIGHT CARPAL TUNNEL RELEASE;  Surgeon: Marlyne Beards, MD;  Location: [facility];  Service: Orthopedics;  Laterality: Right;   KNEE SURGERY Left    WISDOM TOOTH EXTRACTION      Family Psychiatric History:  Patient denies a family history of psychiatric illness   Family History:  Family History  Problem Relation Age of Onset   Hypertension Mother    Hypertension Sister    Hypertension Brother     Social History:  Social History   Socioeconomic History   Marital status: Single    Spouse name: Not on file   Number of children: Not on file   Years of education: Not on file   Highest education level: Not on file  Occupational History   Not on file  Tobacco Use   Smoking status: Every Day    Packs/day: .25    Types: Cigarettes   Smokeless tobacco: Never  Substance and Sexual Activity   Alcohol use: Not Currently    Comment: 4- 40 ounces of beer/week   Drug use: No   Sexual activity: Not on file  Other Topics Concern   Not on file  Social History Narrative   Not on file   Social Determinants of Health   Financial Resource Strain: Not on file  Food Insecurity: Not on file  Transportation Needs: Not on file  Physical Activity: Not on file  Stress: Not on file  Social Connections: Not on file    Allergies: No Known Allergies  Metabolic Disorder Labs: Lab Results  Component Value Date   HGBA1C 5.9f 10/01/2015   No results found for: "PROLACTIN" No results found for: "CHOL", "TRIG", "HDL", "CHOLHDL", "VLDL", "LDLCALC" Lab Results  Component Value Date   TSH 3.307 04/16/2015    Therapeutic Level Labs: No results found  for: "LITHIUM" No results found for: "VALPROATE" No results found for: "CBMZ"  Current Medications: Current Outpatient Medications  Medication Sig Dispense Refill   ARIPiprazole (ABILIFY) 2 MG tablet Take 1 tablet (2 mg total) by mouth daily. 30 tablet 2   nicotine (NICODERM CQ - DOSED IN MG/24 HOURS) 14 mg/24hr patch Place 1 patch (14 mg total) onto the skin daily. 28 patch 1   Oxycodone HCl 10 MG TABS Take 10 mg by mouth 3 (three) times daily.     baclofen (LIORESAL) 20 MG tablet Take 20 mg by mouth 3 (three) times daily as needed.     citalopram (CELEXA) 20 MG tablet Take 1 tablet (20 mg total) by mouth daily. 30 tablet 2   hydrOXYzine (ATARAX) 25 MG tablet Take 1 tablet (  25 mg total) by mouth 3 (three) times daily as needed. 90 tablet 0   lisinopril (ZESTRIL) 20 MG tablet Take 20 mg by mouth daily.     omeprazole (PRILOSEC) 40 MG capsule Take 40 mg by mouth daily.     traZODone (DESYREL) 100 MG tablet Take 1 tablet (100 mg total) by mouth at bedtime as needed for sleep. 30 tablet 3   No current facility-administered medications for this visit.     Musculoskeletal: Strength & Muscle Tone: within normal limits Gait & Station: normal Patient leans: N/A  Psychiatric Specialty Exam: Review of Systems  Psychiatric/Behavioral:  Negative for hallucinations, sleep disturbance and suicidal ideas.     Blood pressure (!) 155/70, pulse 80, resp. rate 16, weight 268 lb (121.6 kg), SpO2 99 %.Body mass index is 37.38 kg/m.  General Appearance: Casual  Eye Contact:  Good  Speech:  Clear and Coherent  Volume:  Normal  Mood:  Euthymic  Affect:  Appropriate  Thought Process:  Goal Directed  Orientation:  Full (Time, Place, and Person)  Thought Content: Tangential   Suicidal Thoughts:  No  Homicidal Thoughts:  No  Memory:  Immediate;   Good Recent;   Good  Judgement:  Good  Insight:  Shallow  Psychomotor Activity:  Normal  Concentration:  Concentration: Fair  Recall:  Good  Fund of  Knowledge: Fair  Language: Good  Akathisia:  No  Handed:    AIMS (if indicated): not done  Assets:  Manufacturing systems engineer Desire for Improvement Financial Resources/Insurance Housing Resilience Social Support Transportation  ADL's:  Intact  Cognition: WNL  Sleep:  Fair   Screenings: GAD-7    Advertising copywriter from 11/20/2022 in [facility] Counselor from 05/11/2022 in [facility] Counselor from 03/08/2022 in [facility] Office Visit from 02/14/2022 in [facility] Counselor from 01/10/2022 in [facility]  Total GAD-7 Score 14 9 10 14 9       PHQ2-9    Flowsheet Row Counselor from 11/20/2022 in [facility] Counselor from 03/08/2022 in [facility] Office Visit from 02/14/2022 in [facility] Counselor from 01/10/2022 in [facility] Office Visit from 11/15/2021 in [facility]  PHQ-2 Total Score 2 3 4  0 4  PHQ-9 Total Score 12 11 15 5 8       Flowsheet Row Counselor from 11/20/2022 in [facility] Office Visit from 02/14/2022 in [facility] Office Visit from 11/15/2021 in [facility]  C-SSRS RISK CATEGORY No Risk No Risk No Risk        Assessment and Plan:   High level suspicion for bipolar 2 disorder and patient.  We will need to flush out at future visits whether or not patient hypomanic episodes where he has decreased need for sleep, delusions of grandeur, impulsivity and increased energy are in the context of THC use however, at this time we will add medication for mood stabilization as patient himself feels as though his thoughts are "all over the place" and has noted that his family will make comments on occasion when he  is having hypomanic episodes.  Patient is currently sober from [facility] use.  Despite that, patient was verbose on assessment today, but also endorsed that he did not feel like he normally likes to talk.  It is also interesting  that patient came in today after approximately 6 months for both medication management and therapy. 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 there was discussion patient regarding starting Trileptal versus Abilify, ultimately patient endorsed that he wanted to minimize the number of pills he has to take daily and reported he would rather take Abilify once a day. Would also like to avoid more sedative mood stabilizers given patient's night time urge incontinence.   Bipolar 2 disorder VS substance-induced mood disorder GAD - Start Abilify 2 mg daily - Continue Celexa 20 mg daily - Continue hydroxyzine 25 mg 3 times daily as needed - Patient was to continue trazodone, changed to 100 mg nightly as needed - Patient will continue with therapy  Tobacco use disorder - Start NRT patch 14mg  daily  Chronic his pain - Continue Oxy 10mg  per PCP  HTN - Patient to request lisinopril be filled  Collaboration of Care: Collaboration of Care:   Patient/Guardian was advised Release of Information must be obtained prior to any record release in order to collaborate their care with an outside provider. Patient/Guardian was advised if they have not already done so to contact the registration department to sign all necessary forms in order for [place] to release information regarding their care.   Consent: Patient/Guardian gives verbal consent for treatment and assignment of benefits for services provided during this visit. Patient/Guardian expressed understanding and agreed to proceed.   PGY-3 Bobbye Morton, MD 11/20/2022, 5:33 PM
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
## 2023-01-08 NOTE — Progress Notes (Deleted)
BH MD Outpatient Progress Note  01/08/2023 7:31 PM CARYL Fischer  MRN:  [id number]  Assessment:  Spencer Fischer presents for follow-up evaluation in-person. Today, 01/08/23, patient reports ***  Identifying Information: Spencer Fischer is a 57 y.o. male with a history of significant for major depressive disorder, insomnia, and generalized anxiety disorder  and PMH of sleep apnea who is an established patient with [facility] for medication management.        Plan:  # Bipolar 2 Disorder vs Substance Induced mood disorder Past medication trials:  Status of problem: *** Interventions: -- Abilify 2 mg daily -- Celexa 20 mg daily -- Hydroxyzine 24 mg tid prn -- Trazodone 50 mg at bedtime prn  # tobacco use disorder Past medication trials:  Status of problem: *** Interventions: -- start NRT 14 mg daily  # Generalized anxiety disorder Past medication trials:  Status of problem: *** Interventions: -- ***  Return to care in ***  Patient was given contact information for behavioral health clinic and was instructed to call 911 for emergencies.    Patient and plan of care will be discussed with the Attending MD ,Dr. ***, who agrees with the above statement and plan.   Subjective:  Chief Complaint: No chief complaint on file.   Interval History: ***  Visit Diagnosis: No diagnosis found.  Past Psychiatric History:  Diagnoses: *** Medication trials: *** Previous psychiatrist/therapist: *** Hospitalizations: *** Suicide attempts: *** SIB: *** Hx of violence towards others: *** Current access to guns: *** Hx of trauma/abuse: *** Substance use: ***  Past Medical History:  Past Medical History:  Diagnosis Date   Anxiety    Arthritis    Carpal tunnel syndrome    Carpal tunnel syndrome, left    Depression    GERD (gastroesophageal reflux disease)    Hypertension    Sleep apnea    wears CPAP    Past Surgical History:  Procedure Laterality Date    BILATERAL ANTERIOR TOTAL HIP ARTHROPLASTY Bilateral 04/06/2015   Procedure: BILATERAL ANTERIOR TOTAL HIP ARTHROPLASTY;  Surgeon: Kathryne Hitch, MD;  Location: [facility] OR;  Service: Orthopedics;  Laterality: Bilateral;   CARPAL TUNNEL RELEASE Left 08/24/2016   Procedure: LEFT OPEN CARPAL TUNNEL RELEASE;  Surgeon: Kathryne Hitch, MD;  Location: [facility] OR;  Service: Orthopedics;  Laterality: Left;   CARPAL TUNNEL RELEASE Right 05/11/2021   Procedure: RIGHT CARPAL TUNNEL RELEASE;  Surgeon: Marlyne Beards, MD;  Location: [facility];  Service: Orthopedics;  Laterality: Right;   KNEE SURGERY Left    WISDOM TOOTH EXTRACTION      Family Psychiatric History: ***  Family History:  Family History  Problem Relation Age of Onset   Hypertension Mother    Hypertension Sister    Hypertension Brother     Social History:  Academic/Vocational: *** Social History   Socioeconomic History   Marital status: Single    Spouse name: Not on file   Number of children: Not on file   Years of education: Not on file   Highest education level: Not on file  Occupational History   Not on file  Tobacco Use   Smoking status: Every Day    Current packs/day: 0.25    Types: Cigarettes   Smokeless tobacco: Never  Substance and Sexual Activity   Alcohol use: Not Currently    Comment: 4- 40 ounces of beer/week   Drug use: No   Sexual activity: Not on file  Other Topics Concern  Not on file  Social History Narrative   Not on file   Social Determinants of Health   Financial Resource Strain: Not on file  Food Insecurity: Not on file  Transportation Needs: Not on file  Physical Activity: Not on file  Stress: Not on file  Social Connections: Not on file    Allergies: No Known Allergies  Current Medications: Current Outpatient Medications  Medication Sig Dispense Refill   ARIPiprazole (ABILIFY) 2 MG tablet Take 1 tablet (2 mg total) by mouth daily. 30 tablet 2   baclofen  (LIORESAL) 20 MG tablet Take 20 mg by mouth 3 (three) times daily as needed.     citalopram (CELEXA) 20 MG tablet Take 1 tablet (20 mg total) by mouth daily. 30 tablet 2   hydrOXYzine (ATARAX) 25 MG tablet Take 1 tablet (25 mg total) by mouth 3 (three) times daily as needed. 90 tablet 0   lisinopril (ZESTRIL) 20 MG tablet Take 20 mg by mouth daily.     nicotine (NICODERM CQ - DOSED IN MG/24 HOURS) 14 mg/24hr patch Place 1 patch (14 mg total) onto the skin daily. 28 patch 1   omeprazole (PRILOSEC) 40 MG capsule Take 40 mg by mouth daily.     Oxycodone HCl 10 MG TABS Take 10 mg by mouth 3 (three) times daily.     traZODone (DESYREL) 100 MG tablet Take 1 tablet (100 mg total) by mouth at bedtime as needed for sleep. 30 tablet 3   No current facility-administered medications for this visit.    ROS: Review of Systems ***  Objective:  Psychiatric Specialty Exam: There were no vitals taken for this visit.There is no height or weight on file to calculate BMI.  General Appearance: {Appearance:22683}  Eye Contact:  {BHH EYE CONTACT:22684}  Speech:  {Speech:22685}  Volume:  {Volume (PAA):22686}  Mood:  {BHH MOOD:22306}  Affect:  {Affect (PAA):22687}  Thought Content: {Thought Content:22690}   Suicidal Thoughts:  {ST/HT (PAA):22692}  Homicidal Thoughts:  {ST/HT (PAA):22692}  Thought Process:  {Thought Process (PAA):22688}  Orientation:  {BHH ORIENTATION (PAA):22689}    Memory: {BHH MEMORY:22881}  Judgment:  {Judgement (PAA):22694}  Insight:  {Insight (PAA):22695}  Concentration:  {Concentration:21399}  Recall: not formally assessed ***  Fund of Knowledge: {BHH GOOD/FAIR/POOR:22877}  Language: {BHH GOOD/FAIR/POOR:22877}  Psychomotor Activity:  {Psychomotor (PAA):22696}  Akathisia:  {BHH YES OR NO:22294}  AIMS (if indicated): {Desc; done/not:10129}  Assets:  {Assets (PAA):22698}  ADL's:  {BHH AYT'K:16010}  Cognition: {chl bhh cognition:304700322}  Sleep:  {BHH GOOD/FAIR/POOR:22877}    PE: General: well-appearing; no acute distress *** Pulm: no increased work of breathing on room air *** Strength & Muscle Tone: {desc; muscle tone:32375} Neuro: no focal neurological deficits observed *** Gait & Station: {PE GAIT ED XNAT:55732}  Metabolic Disorder Labs: Lab Results  Component Value Date   HGBA1C 5.64f 10/01/2015   No results found for: "PROLACTIN" No results found for: "CHOL", "TRIG", "HDL", "CHOLHDL", "VLDL", "LDLCALC" Lab Results  Component Value Date   TSH 3.307 04/16/2015    Therapeutic Level Labs: No results found for: "LITHIUM" No results found for: "VALPROATE" No results found for: "CBMZ"  Screenings: GAD-7    Flowsheet Row Counselor from 11/20/2022 in [facility] Counselor from 05/11/2022 in [facility] Counselor from 03/08/2022 in [facility] Office Visit from 02/14/2022 in [facility] Counselor from 01/10/2022 in [facility]  Total GAD-7 Score 14 9 10 14  9  MWU1-3    Flowsheet Row Counselor from 11/20/2022 in [facility] Counselor from 03/08/2022 in [facility] Office Visit from 02/14/2022 in [facility] Counselor from 01/10/2022 in [facility] Office Visit from 11/15/2021 in [facility]  PHQ-2 Total Score 2 3 4  0 4  PHQ-9 Total Score 12 11 15 5 8       Flowsheet Row Counselor from 11/20/2022 in [facility] Office Visit from 02/14/2022 in [facility] Office Visit from 11/15/2021 in [facility]  C-SSRS RISK CATEGORY No Risk No Risk No Risk       Collaboration of Care: Collaboration of Care: [facility] OP Collaboration of KGMW:[id number]}  Patient/Guardian was advised Release of Information must  be obtained prior to any record release in order to collaborate their care with an outside provider. Patient/Guardian was advised if they have not already done so to contact the registration department to sign all necessary forms in order for [place] to release information regarding their care.   Consent: Patient/Guardian gives verbal consent for treatment and assignment of benefits for services provided during this visit. Patient/Guardian expressed understanding and agreed to proceed.   Park Pope, MD 01/08/2023, 7:31 PM

## 2023-01-09 ENCOUNTER — Encounter (HOSPITAL_COMMUNITY): Payer: Medicare HMO | Admitting: Student

## 2023-02-23 ENCOUNTER — Other Ambulatory Visit (HOSPITAL_COMMUNITY): Payer: Self-pay | Admitting: Physician Assistant

## 2023-02-23 DIAGNOSIS — F5105 Insomnia due to other mental disorder: Secondary | ICD-10-CM

## 2023-03-29 ENCOUNTER — Ambulatory Visit: Payer: Medicare HMO | Attending: Urology | Admitting: Physical Therapy

## 2023-04-05 ENCOUNTER — Ambulatory Visit: Payer: Medicare HMO | Admitting: Physical Therapy

## 2023-04-12 ENCOUNTER — Encounter: Payer: Medicare HMO | Admitting: Physical Therapy

## 2023-04-19 ENCOUNTER — Encounter: Payer: Medicare HMO | Admitting: Physical Therapy

## 2023-04-26 ENCOUNTER — Encounter: Payer: Medicare HMO | Admitting: Physical Therapy

## 2023-07-17 ENCOUNTER — Other Ambulatory Visit (HOSPITAL_COMMUNITY): Payer: Self-pay | Admitting: Physician Assistant

## 2023-07-17 DIAGNOSIS — F332 Major depressive disorder, recurrent severe without psychotic features: Secondary | ICD-10-CM

## 2023-09-03 ENCOUNTER — Telehealth (HOSPITAL_COMMUNITY): Payer: Self-pay

## 2023-09-03 NOTE — Telephone Encounter (Signed)
 Hello DR. Gerlean Ren,    I had this Pt call to refill his medications, Pt never stated which ones needed to be refilled, called twice and couldn't leave a HIPAA compliant voicemail as it was not set up on his end.    JNL

## 2023-09-07 ENCOUNTER — Telehealth (HOSPITAL_COMMUNITY): Payer: Self-pay

## 2023-09-07 NOTE — Telephone Encounter (Signed)
 Called Pt and transferred to FD to reschedule or reestablish.    JNL

## 2023-09-07 NOTE — Telephone Encounter (Signed)
Understood, Thank you.  

## 2023-09-07 NOTE — Telephone Encounter (Signed)
 Thank you. I have not seen this patient in almost a year, I would not be able to refill his medications due to this. If he calls back he will need to come as a walk-in to be seen before getting a refill.

## 2023-09-17 ENCOUNTER — Ambulatory Visit (HOSPITAL_COMMUNITY): Admitting: Student

## 2024-01-07 ENCOUNTER — Telehealth (HOSPITAL_COMMUNITY): Payer: Self-pay

## 2024-01-07 NOTE — Telephone Encounter (Signed)
 Patient called requesting a therapy appointment. He then said he needed medication. I told him that if he needed immediate help that he could go to urgent care or come to walk in. Upon looking at his chart, he no showed appointments for Dr. Lynnette at two locations, including [facility]. Due to his insurance, I referred him to [facility] [place] and told him he would need a referral from his PCP to go there. Patient agreed to call his PCP to get the referral.  Thank you.

## 2024-02-25 NOTE — Progress Notes (Unsigned)
 Psychiatric Initial Adult Assessment   Patient Identification: Spencer Fischer MRN:  [id number] Date of Evaluation:  02/26/2024 Referral Source: Self Chief Complaint:   Chief Complaint  Patient presents with   Establish Care   Visit Diagnosis:    ICD-10-CM   1. Insomnia due to other mental disorder  F51.05 traZODone  (DESYREL ) 100 MG tablet   F99     2. Generalized anxiety disorder  F41.1 ARIPiprazole  (ABILIFY ) 2 MG tablet    hydrOXYzine  (ATARAX ) 25 MG tablet    3. Severe episode of recurrent major depressive disorder, without psychotic features (HCC)  F33.2 ARIPiprazole  (ABILIFY ) 2 MG tablet    citalopram  (CELEXA ) 10 MG tablet      Assessment:  Spencer Fischer is a 58 y.o. male with a history of Bipolar 2 vs substance use disorder, GAD, and tobacco use disorder who presents in person to [facility] for initial evaluation on 02/26/2024.     High level suspicion for bipolar 2 disorder and patient.  We will need to flush out at future visits whether or not patient hypomanic episodes where he has decreased need for sleep, delusions of grandeur, impulsivity and increased energy are in the context of THC use however, at this time we will add medication for mood stabilization as patient himself feels as though his thoughts are all over the place and has noted that his family will make comments on occasion when he is having hypomanic episodes.  Patient is currently sober from [facility] use.  Despite that, patient was verbose on assessment today, but also endorsed that he did not feel like he normally likes to talk.  It is also interesting that patient came in today after approximately 6 months for both medication management and therapy. 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 there was discussion patient regarding starting Trileptal  versus Abilify , ultimately patient endorsed that he wanted to minimize the number of pills he has to take daily and reported he would rather take Abilify  once a day. Would also like to avoid more sedative mood stabilizers given patient's night time urge incontinence.   At initial evaluation patient reports neurovegetative symptoms of depression including low mood, anhedonia, disturbed sleep, fatigue, Treese concentration, and feelings of worthlessness.  He denies any SI or thoughts of self-harm.  Patient also endorsed symptoms of anxiety including excessive worry, increased irritability, and restlessness.  He has struggled with chronic pain symptoms which can contribute to both mood and anxiety.  Patient endorsed ongoing psychosocial stressors of pending court cases.  Patient has benefited from the medications in the past though struggles with appointment compliance.  Would be appropriate to restart on medications today and encouraged patient to follow-up consistently.  Of note we will start with Celexa  10 mg and can titrate to 20 mg at future appointments if needed.   Risk Assessment: A suicide and violence risk assessment was performed as part of this evaluation. There patient is deemed to be at chronic elevated risk for self-harm/suicide given the following factors: history of depression. These risk factors are mitigated by the following factors: lack of active SI/HI and no known access to weapons or firearms. The patient is deemed to be at chronic elevated risk for violence given the following factors: N/A. These risk factors are mitigated by the following factors: N/A. There is  no acute risk for suicide or violence at this time. The patient was educated about relevant modifiable risk factors including following recommendations for treatment of psychiatric illness and abstaining from substance abuse.  While future psychiatric events cannot be accurately predicted, the patient does not currently require   acute inpatient psychiatric care and does not currently meet [facility]  involuntary commitment criteria.  Patient was given contact information for crisis resources, behavioral health clinic and was instructed to call 911 for emergencies.   GAD/MDD Past medication trials:  Status of problem: Ongoing Interventions: - Restart Abilify  2 mg daily - Restart Celexa  10 mg daily - Restart hydroxyzine  25 mg 3 times daily as needed - Restart trazodone  100 mg nightly as needed\  Tobacco use disorder Past medication trials:  Status of problem: Ongoing Interventions: - Continue NRT patch 14mg  daily  Chronic his pain Past medication trials:  Status of problem: Stable Interventions: - Continue Oxycodone  10mg  QID managed PCP  HTN Past medication trials:  Status of problem: Stable Interventions: - Managed by PCP  History of Present Illness:  Spencer Fischer presents to establish care after having last been seen by Dr. Juleen a year and a half ago.  Per patient report he has been off medication for quite some time and notes that his mental health is worse not on the medicines.  He describes experiencing lower moods, increased difficulty with focus, difficulty organizing thoughts, racing thoughts, increased agitation, and difficulties with insomnia when not taking his medicines.  PHQ 9 was completed and he scored a 12 and GAD-7 was a 14.  Patient describes himself as being overly cautious as a result of his anxiety.  Patient stressors include ongoing court cases.  While patient had a history of bipolar 2 diagnosis in the past he denied any hallucinations, delusions, paranoia, mania, or hypomania symptoms.  He does endorse a past history of substance use since possible that symptoms in the past were consistent with substance-induced mood disorder versus true bipolar disorder.  He reports that he has supports in his mother, dog, and faith.  Patient is interested in getting back on medications and felt  that his previous Spencer Fischer worked well for him.  He denied any adverse side effects from the regimen.  He is unable to recall any other meds he was on besides the Celexa , Abilify , hydroxyzine , or trazodone .  Past Psychiatric History:  Past psychiatric diagnoses: GAD, MDD, bipolar 2 disorder, substance-induced mood disorder, tobacco use disorder Psychiatric hospitalizations:Denies Past suicide attempts: Denies Hx of self harm: Denies Hx of violence towards others: Denied Prior psychiatric providers: Dr. Juleen Prior therapy: denies Access to firearms: Denies  Prior medication trials:  Can not recall  Substance use: History of THC, occasional alcohol, and nicotine  use. Has used cocaine in the past.   Generalized anxiety disorder Major depressive disorder  Hx of Prison and Substance abuse THC- none in 3 months Etoh- rare Tobacco: 10 cigs/ day, patient wants to try Nrt to help him stop  Past Spencer History:  Past Spencer History:  Diagnosis Date   Anxiety    Arthritis    Carpal tunnel syndrome    Carpal tunnel syndrome, left    Depression    GERD (gastroesophageal reflux disease)    Hypertension    Sleep apnea    wears CPAP    Past Surgical History:  Procedure Laterality Date   BILATERAL ANTERIOR TOTAL HIP ARTHROPLASTY Bilateral 04/06/2015   Procedure: BILATERAL ANTERIOR TOTAL HIP ARTHROPLASTY;  Surgeon: Lonni CINDERELLA Poli, MD;  Location: [facility] OR;  Service: Orthopedics;  Laterality: Bilateral;   CARPAL TUNNEL RELEASE Left 08/24/2016   Procedure: LEFT OPEN CARPAL TUNNEL RELEASE;  Surgeon: Lonni CINDERELLA Poli, MD;  Location[facility] OR;  Service: Orthopedics;  Laterality: Left;   CARPAL TUNNEL RELEASE Right 05/11/2021   Procedure: RIGHT CARPAL TUNNEL RELEASE;  Surgeon: Romona Harari, MD;  Location: [facility];  Service: Orthopedics;  Laterality: Right;   KNEE SURGERY Left    WISDOM TOOTH EXTRACTION      Family Psychiatric History: Denies  Family  History:  Family History  Problem Relation Age of Onset   Hypertension Mother    Hypertension Sister    Hypertension Brother     Social History:   Social History   Socioeconomic History   Marital status: Single    Spouse name: Not on file   Number of children: Not on file   Years of education: Not on file   Highest education level: Not on file  Occupational History   Not on file  Tobacco Use   Smoking status: Every Day    Current packs/day: 0.25    Types: Cigarettes   Smokeless tobacco: Never  Substance and Sexual Activity   Alcohol use: Not Currently    Comment: 4- 40 ounces of beer/week   Drug use: No   Sexual activity: Not on file  Other Topics Concern   Not on file  Social History Narrative   Not on file   Social Drivers of Health   Financial Resource Strain: Not on file  Food Insecurity: Not on file  Transportation Needs: Not on file  Physical Activity: Not on file  Stress: Not on file  Social Connections: Not on file    Additional Social History: Lives with his mom and dog.  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  Allergies:  No Known Allergies  Metabolic Disorder Labs: Lab Results  Component Value Date   HGBA1C 5.43f 10/01/2015   No results found for: PROLACTIN No results found for: CHOL, TRIG, HDL, CHOLHDL, VLDL, LDLCALC Lab Results  Component Value Date   TSH 3.307 04/16/2015    Therapeutic Level Labs: No results found for: LITHIUM No results found for: CBMZ No results found for: VALPROATE  Current Medications: Current Outpatient Medications  Medication Sig Dispense Refill   baclofen (LIORESAL) 20 MG tablet Take 20 mg by mouth 3 (three) times daily as needed.     lisinopril (ZESTRIL) 20 MG tablet Take 20 mg by mouth daily.     nicotine  (NICODERM CQ  - DOSED IN MG/24 HOURS) 14 mg/24hr patch Place 1 patch (14 mg total) onto the skin daily. 28 patch 1   Oxycodone  HCl 10 MG TABS Take 10 mg by  mouth 3 (three) times daily.     ARIPiprazole  (ABILIFY ) 2 MG tablet Take 1 tablet (2 mg total) by mouth daily. 30 tablet 2   citalopram  (CELEXA ) 10 MG tablet Take 1 tablet (10 mg total) by mouth daily. 30 tablet 2   hydrOXYzine  (ATARAX ) 25 MG tablet Take 1 tablet (25 mg total) by mouth 3 (three) times daily as needed. 90 tablet 2   omeprazole (PRILOSEC) 40 MG capsule Take 40 mg by mouth daily. (Patient not taking: Reported on 02/26/2024)     traZODone  (DESYREL ) 100 MG tablet Take 1 tablet (100 mg total) by mouth at bedtime as needed for sleep. 30 tablet 2   No current facility-administered medications for this visit.  Musculoskeletal: Strength & Muscle Tone: within normal limits Gait & Station: normal Patient leans: N/A  Psychiatric Specialty Exam:  Psychiatric Specialty Exam: Blood pressure (!) 142/84, pulse 81, height 5' 11 (1.803 m), weight 250 lb (113.4 kg).Body mass index is 34.87 kg/m. Review of Systems  General Appearance: Fairly Groomed  Eye Contact:  Fair would close eyes during some parts of exam  Speech:  Clear and Coherent  Volume:  Normal  Mood:  Anxious and Euthymic  Affect:  Congruent  Thought Content: Logical   Suicidal Thoughts:  No  Homicidal Thoughts:  No  Thought Process:  Coherent  Orientation:  Full (Time, Place, and Person)    Memory: Immediate;   Fair  Judgment:  Fair  Insight:  Fair  Concentration:  Concentration: Fair  Recall:  not formally assessed   Fund of Knowledge: Poor  Language: Fair  Psychomotor Activity:  Normal  Akathisia:  NA  AIMS (if indicated): not done  Assets:  Communication Skills Desire for Improvement Financial Resources/Insurance Housing  ADL's:  Intact  Cognition: WNL  Sleep:  Fair    Screenings: GAD-7    Advertising copywriter from 11/20/2022 in [facility] Counselor from 05/11/2022 in [facility] Counselor from 03/08/2022 in [facility] Office Visit from 02/14/2022 in [facility] Counselor from 01/10/2022 in [facility]  Total GAD-7 Score 14 9 10 14 9    PHQ2-9    Flowsheet Row Office Visit from 02/26/2024 [facility] Counselor from 11/20/2022 in [facility] Counselor from 03/08/2022 in [facility] Office Visit from 02/14/2022 in [facility] Counselor from 01/10/2022 in [facility]  PHQ-2 Total Score 2 2 3 4  0  PHQ-9 Total Score 12 12 11 15 5    Flowsheet Row Counselor from 11/20/2022 in [facility] Office Visit from 02/14/2022 in [facility] Office Visit from 11/15/2021 in [facility]  C-SSRS RISK CATEGORY No Risk No Risk No Risk     Collaboration of Care: Medication Management AEB medication prescription, Primary Care Provider AEB chart review, and Psychiatrist AEB review  Patient/Guardian was advised Release of Information must be obtained prior to any record release in order to collaborate their care with an outside provider. Patient/Guardian was advised if they have not already done so to contact the registration department to sign all necessary forms in order for us  to release information regarding their care.   Consent: Patient/Guardian gives verbal consent for treatment and assignment of benefits for services provided during this visit. Patient/Guardian expressed understanding and agreed to proceed.   Arvella CHRISTELLA Finder, MD 9/16/20252:49 PM

## 2024-02-26 ENCOUNTER — Ambulatory Visit (HOSPITAL_BASED_OUTPATIENT_CLINIC_OR_DEPARTMENT_OTHER): Admitting: Psychiatry

## 2024-02-26 ENCOUNTER — Other Ambulatory Visit: Payer: Self-pay

## 2024-02-26 ENCOUNTER — Encounter (HOSPITAL_COMMUNITY): Payer: Self-pay | Admitting: Psychiatry

## 2024-02-26 DIAGNOSIS — F332 Major depressive disorder, recurrent severe without psychotic features: Secondary | ICD-10-CM

## 2024-02-26 DIAGNOSIS — F411 Generalized anxiety disorder: Secondary | ICD-10-CM | POA: Diagnosis not present

## 2024-02-26 DIAGNOSIS — F5105 Insomnia due to other mental disorder: Secondary | ICD-10-CM | POA: Diagnosis not present

## 2024-02-26 DIAGNOSIS — F99 Mental disorder, not otherwise specified: Secondary | ICD-10-CM | POA: Diagnosis not present

## 2024-02-26 MED ORDER — ARIPIPRAZOLE 2 MG PO TABS: 2.0000 mg | ORAL_TABLET | Freq: Every day | ORAL | 2 refills | Status: AC

## 2024-02-26 MED ORDER — TRAZODONE HCL 100 MG PO TABS: 100.0000 mg | ORAL_TABLET | Freq: Every evening | ORAL | 2 refills | Status: AC | PRN

## 2024-02-26 MED ORDER — HYDROXYZINE HCL 25 MG PO TABS: 25.0000 mg | ORAL_TABLET | Freq: Three times a day (TID) | ORAL | 2 refills | Status: AC | PRN

## 2024-02-26 MED ORDER — CITALOPRAM HYDROBROMIDE 10 MG PO TABS: 10.0000 mg | ORAL_TABLET | Freq: Every day | ORAL | 2 refills | Status: AC

## 2024-04-22 ENCOUNTER — Other Ambulatory Visit (HOSPITAL_COMMUNITY): Payer: Self-pay | Admitting: Psychiatry

## 2024-04-22 DIAGNOSIS — F5105 Insomnia due to other mental disorder: Secondary | ICD-10-CM

## 2024-04-22 DIAGNOSIS — F332 Major depressive disorder, recurrent severe without psychotic features: Secondary | ICD-10-CM

## 2024-04-22 DIAGNOSIS — F411 Generalized anxiety disorder: Secondary | ICD-10-CM

## 2024-05-19 NOTE — Progress Notes (Deleted)
 BH MD/PA/NP OP Progress Note  05/19/2024 9:08 AM BASTIAN ANDREOLI  MRN:  [id number]  Visit Diagnosis: No diagnosis found.  Assessment: Spencer Fischer is a 58 y.o. male with a history of Bipolar 2 vs substance use disorder, GAD, and tobacco use disorder who presents in person to [facility] at [facility] for initial evaluation on 02/26/2024.    At initial evaluation patient reports neurovegetative symptoms of depression including low mood, anhedonia, disturbed sleep, fatigue, Treese concentration, and feelings of worthlessness.  He denies any SI or thoughts of self-harm.  Patient also endorsed symptoms of anxiety including excessive worry, increased irritability, and restlessness.  He has struggled with chronic pain symptoms which can contribute to both mood and anxiety.  Patient endorsed ongoing psychosocial stressors of pending court cases.  Patient has benefited from the medications in the past though struggles with appointment compliance.  Would be appropriate to restart on medications today and encouraged patient to follow-up consistently.  Of note we will start with Celexa  10 mg and can titrate to 20 mg at future appointments if needed.  Spencer Fischer presents for follow-up evaluation. Today, 05/19/24, patient reports ***  Risk Assessment: An assessment of suicide and violence risk factors was performed as part of this evaluation and is not significantly changed from the last visit. While future psychiatric events cannot be accurately predicted, the patient does not currently require acute inpatient psychiatric care and does not currently meet [facility]  involuntary commitment criteria. Patient was given contact information for crisis resources, behavioral health clinic and was instructed to call 911 for emergencies.   GAD/MDD Past medication trials:  Status of problem: Ongoing Interventions: - Restart Abilify  2 mg daily - Restart Celexa  10 mg daily - Restart  hydroxyzine  25 mg 3 times daily as needed - Restart trazodone  100 mg nightly as needed\  Tobacco use disorder Past medication trials:  Status of problem: Ongoing Interventions: - Continue NRT patch 14mg  daily  Chronic his pain Past medication trials:  Status of problem: Stable Interventions: - Continue Oxycodone  10mg  QID managed PCP  HTN Past medication trials:  Status of problem: Stable Interventions: - Managed by PCP  Chief Complaint: No chief complaint on file.  History of Present Illness:  Spencer Fischer presents reporting    to establish care after having last been seen by Dr. Juleen a year and a half ago.  Per patient report he has been off medication for quite some time and notes that his mental health is worse not on the medicines.  He describes experiencing lower moods, increased difficulty with focus, difficulty organizing thoughts, racing thoughts, increased agitation, and difficulties with insomnia when not taking his medicines.  PHQ 9 was completed and he scored a 12 and GAD-7 was a 14.  Patient describes himself as being overly cautious as a result of his anxiety.  Patient stressors include ongoing court cases.  While patient had a history of bipolar 2 diagnosis in the past he denied any hallucinations, delusions, paranoia, mania, or hypomania symptoms.  He does endorse a past history of substance use since possible that symptoms in the past were consistent with substance-induced mood disorder versus true bipolar disorder.  He reports that he has supports in his mother, dog, and faith.  Patient is interested in getting back on medications and felt that his previous Agyeman worked well for him.  He denied any adverse side effects from the regimen.  He is unable to recall any other meds he was on  besides the Celexa , Abilify , hydroxyzine , or trazodone .  Past Psychiatric History:  Past psychiatric diagnoses: GAD, MDD, bipolar 2 disorder, substance-induced mood disorder,  tobacco use disorder Psychiatric hospitalizations:Denies Past suicide attempts: Denies Hx of self harm: Denies Hx of violence towards others: Denied Prior psychiatric providers: Dr. Juleen Prior therapy: denies Access to firearms: Denies  Prior medication trials:  Can not recall  Substance use: History of THC, occasional alcohol, and nicotine  use. Has used cocaine in the past.   Generalized anxiety disorder Major depressive disorder  Hx of Prison and Substance abuse THC- none in 3 months Etoh- rare Tobacco: 10 cigs/ day, patient wants to try Nrt to help him stop  Past Medical History:  Past Medical History:  Diagnosis Date   Anxiety    Arthritis    Carpal tunnel syndrome    Carpal tunnel syndrome, left    Depression    GERD (gastroesophageal reflux disease)    Hypertension    Sleep apnea    wears CPAP    Past Surgical History:  Procedure Laterality Date   BILATERAL ANTERIOR TOTAL HIP ARTHROPLASTY Bilateral 04/06/2015   Procedure: BILATERAL ANTERIOR TOTAL HIP ARTHROPLASTY;  Surgeon: Lonni CINDERELLA Poli, MD;  Location: [facility] OR;  Service: Orthopedics;  Laterality: Bilateral;   CARPAL TUNNEL RELEASE Left 08/24/2016   Procedure: LEFT OPEN CARPAL TUNNEL RELEASE;  Surgeon: Lonni CINDERELLA Poli, MD;  Location: [facility] OR;  Service: Orthopedics;  Laterality: Left;   CARPAL TUNNEL RELEASE Right 05/11/2021   Procedure: RIGHT CARPAL TUNNEL RELEASE;  Surgeon: Romona Harari, MD;  Location: [facility];  Service: Orthopedics;  Laterality: Right;   KNEE SURGERY Left    WISDOM TOOTH EXTRACTION      Family Psychiatric History: ***  Family History:  Family History  Problem Relation Age of Onset   Hypertension Mother    Hypertension Sister    Hypertension Brother     Social History:  Social History   Socioeconomic History   Marital status: Single    Spouse name: Not on file   Number of children: Not on file   Years of education: Not on file   Highest  education level: Not on file  Occupational History   Not on file  Tobacco Use   Smoking status: Every Day    Current packs/day: 0.25    Types: Cigarettes   Smokeless tobacco: Never  Substance and Sexual Activity   Alcohol use: Not Currently    Comment: 4- 40 ounces of beer/week   Drug use: No   Sexual activity: Not on file  Other Topics Concern   Not on file  Social History Narrative   Not on file   Social Drivers of Health   Financial Resource Strain: Not on file  Food Insecurity: Not on file  Transportation Needs: Not on file  Physical Activity: Not on file  Stress: Not on file  Social Connections: Not on file    Allergies: No Known Allergies  Current Medications: Current Outpatient Medications  Medication Sig Dispense Refill   ARIPiprazole  (ABILIFY ) 2 MG tablet Take 1 tablet (2 mg total) by mouth daily. 30 tablet 2   baclofen (LIORESAL) 20 MG tablet Take 20 mg by mouth 3 (three) times daily as needed.     citalopram  (CELEXA ) 10 MG tablet Take 1 tablet (10 mg total) by mouth daily. 30 tablet 2   hydrOXYzine  (ATARAX ) 25 MG tablet Take 1 tablet (25 mg total) by mouth 3 (three) times daily as needed. 90 tablet 2  lisinopril (ZESTRIL) 20 MG tablet Take 20 mg by mouth daily.     nicotine  (NICODERM CQ  - DOSED IN MG/24 HOURS) 14 mg/24hr patch Place 1 patch (14 mg total) onto the skin daily. 28 patch 1   omeprazole (PRILOSEC) 40 MG capsule Take 40 mg by mouth daily. (Patient not taking: Reported on 02/26/2024)     Oxycodone  HCl 10 MG TABS Take 10 mg by mouth 3 (three) times daily.     traZODone  (DESYREL ) 100 MG tablet Take 1 tablet (100 mg total) by mouth at bedtime as needed for sleep. 30 tablet 2   No current facility-administered medications for this visit.     Musculoskeletal: Strength & Muscle Tone: {desc; muscle tone:32375} Gait & Station: {PE GAIT ED WJUO:77474} Patient leans: {Patient Leans:21022755}  Psychiatric Specialty Exam: There were no vitals taken for  this visit.There is no height or weight on file to calculate BMI. Review of Systems  General Appearance: {Appearance:22683}  Eye Contact:  {BHH EYE CONTACT:22684}  Speech:  {Speech:22685}  Volume:  {Volume (PAA):22686}  Mood:  {BHH MOOD:22306}  Affect:  {Affect (PAA):22687}  Thought Content: {Thought Content:22690}   Suicidal Thoughts:  {ST/HT (PAA):22692}  Homicidal Thoughts:  {ST/HT (PAA):22692}  Thought Process:  {Thought Process (PAA):22688}  Orientation:  {BHH ORIENTATION (PAA):22689}    Memory: {BHH MEMORY:22881}  Judgment:  {Judgement (PAA):22694}  Insight:  {Insight (PAA):22695}  Concentration:  {Concentration:21399}  Recall:  not formally assessed ***  Fund of Knowledge: {BHH GOOD/FAIR/POOR:22877}  Language: {BHH GOOD/FAIR/POOR:22877}  Psychomotor Activity:  {Psychomotor (PAA):22696}  Akathisia:  {BHH YES OR NO:22294}  AIMS (if indicated): {Desc; done/not:10129}  Assets:  {Assets (PAA):22698}  ADL's:  {BHH JIO'D:77709}  Cognition: {chl bhh cognition:304700322}  Sleep:  {BHH GOOD/FAIR/POOR:22877}   Metabolic Disorder Labs: Lab Results  Component Value Date   HGBA1C 5.40f 10/01/2015   No results found for: PROLACTIN No results found for: CHOL, TRIG, HDL, CHOLHDL, VLDL, LDLCALC Lab Results  Component Value Date   TSH 3.307 04/16/2015    Therapeutic Level Labs: No results found for: LITHIUM No results found for: VALPROATE No results found for: CBMZ   Screenings: GAD-7    Flowsheet Row Counselor from 11/20/2022 in [facility] Counselor from 05/11/2022 in [facility] Counselor from 03/08/2022 in [facility] Office Visit from 02/14/2022 in [facility] Counselor from 01/10/2022 in [facility]  Total GAD-7 Score 14 9 10 14 9    PHQ2-9    Flowsheet Row Office Visit from 02/26/2024 in [facility] Counselor from 11/20/2022 in [facility] Counselor from 03/08/2022 in [facility] Office Visit from 02/14/2022 in [facility] Counselor from 01/10/2022 in [facility]  PHQ-2 Total Score 2 2 3 4  0  PHQ-9 Total Score 12 12 11 15 5    Flowsheet Row Counselor from 11/20/2022 in [facility] Office Visit from 02/14/2022 in [facility] Office Visit from 11/15/2021 in [facility]  C-SSRS RISK CATEGORY No Risk No Risk No Risk    Collaboration of Care: Collaboration of Care: Medication Management AEB ***  Patient/Guardian was advised Release of Information must be obtained prior to any record release in order to collaborate their care with an outside provider. Patient/Guardian was advised if they have not already done so to contact the registration department to sign all necessary  forms in order for us  to release information regarding their care.   Consent: Patient/Guardian gives verbal consent for treatment and assignment of benefits for services provided during this visit. Patient/Guardian expressed understanding and agreed to proceed.    Spencer CHRISTELLA Finder, MD 05/19/2024, 9:08 AM

## 2024-05-20 ENCOUNTER — Ambulatory Visit (HOSPITAL_COMMUNITY): Admitting: Psychiatry

## 2024-07-31 ENCOUNTER — Ambulatory Visit: Admitting: Podiatry
